# Patient Record
Sex: Female | Born: 1960 | Race: White | Hispanic: No | Marital: Single | Smoking: Never smoker
Health system: Southern US, Community
[De-identification: ages and names within clinical notes are randomized; demographics above are authoritative.]

## PROBLEM LIST (undated history)

## (undated) DIAGNOSIS — F79 Unspecified intellectual disabilities: Secondary | ICD-10-CM

## (undated) DIAGNOSIS — F419 Anxiety disorder, unspecified: Secondary | ICD-10-CM

## (undated) DIAGNOSIS — F200 Paranoid schizophrenia: Secondary | ICD-10-CM

## (undated) DIAGNOSIS — E785 Hyperlipidemia, unspecified: Secondary | ICD-10-CM

## (undated) DIAGNOSIS — E669 Obesity, unspecified: Secondary | ICD-10-CM

## (undated) DIAGNOSIS — K859 Acute pancreatitis without necrosis or infection, unspecified: Secondary | ICD-10-CM

## (undated) DIAGNOSIS — K219 Gastro-esophageal reflux disease without esophagitis: Secondary | ICD-10-CM

## (undated) DIAGNOSIS — I10 Essential (primary) hypertension: Secondary | ICD-10-CM

## (undated) DIAGNOSIS — K831 Obstruction of bile duct: Secondary | ICD-10-CM

## (undated) DIAGNOSIS — S0591XA Unspecified injury of right eye and orbit, initial encounter: Secondary | ICD-10-CM

## (undated) DIAGNOSIS — C549 Malignant neoplasm of corpus uteri, unspecified: Secondary | ICD-10-CM

## (undated) HISTORY — DX: Unspecified injury of right eye and orbit, initial encounter: S05.91XA

## (undated) HISTORY — PX: ABDOMINAL HYSTERECTOMY: SHX81

## (undated) HISTORY — DX: Unspecified intellectual disabilities: F79

## (undated) HISTORY — DX: Essential (primary) hypertension: I10

## (undated) HISTORY — DX: Malignant neoplasm of corpus uteri, unspecified: C54.9

## (undated) HISTORY — DX: Hyperlipidemia, unspecified: E78.5

## (undated) HISTORY — DX: Acute pancreatitis without necrosis or infection, unspecified: K85.90

## (undated) HISTORY — PX: EYE SURGERY: SHX253

## (undated) HISTORY — DX: Obesity, unspecified: E66.9

## (undated) HISTORY — PX: TONSILLECTOMY: SUR1361

---

## 2012-05-16 ENCOUNTER — Ambulatory Visit: Payer: Self-pay | Admitting: Internal Medicine

## 2012-05-31 DIAGNOSIS — C549 Malignant neoplasm of corpus uteri, unspecified: Secondary | ICD-10-CM | POA: Insufficient documentation

## 2012-05-31 HISTORY — DX: Malignant neoplasm of corpus uteri, unspecified: C54.9

## 2012-06-18 ENCOUNTER — Ambulatory Visit: Payer: Self-pay | Admitting: Internal Medicine

## 2012-07-25 DIAGNOSIS — E785 Hyperlipidemia, unspecified: Secondary | ICD-10-CM | POA: Insufficient documentation

## 2012-07-25 DIAGNOSIS — F79 Unspecified intellectual disabilities: Secondary | ICD-10-CM

## 2012-07-25 HISTORY — DX: Hyperlipidemia, unspecified: E78.5

## 2012-07-25 HISTORY — PX: ABDOMINAL HYSTERECTOMY: SHX81

## 2012-07-25 HISTORY — DX: Unspecified intellectual disabilities: F79

## 2013-08-26 ENCOUNTER — Ambulatory Visit: Payer: Self-pay | Admitting: Internal Medicine

## 2013-11-24 ENCOUNTER — Ambulatory Visit: Payer: Self-pay | Admitting: Internal Medicine

## 2014-01-13 ENCOUNTER — Emergency Department: Payer: Self-pay | Admitting: Emergency Medicine

## 2015-01-29 ENCOUNTER — Ambulatory Visit: Payer: Self-pay | Admitting: Internal Medicine

## 2015-04-29 ENCOUNTER — Encounter: Payer: Self-pay | Admitting: Psychiatry

## 2015-04-29 ENCOUNTER — Ambulatory Visit (INDEPENDENT_AMBULATORY_CARE_PROVIDER_SITE_OTHER): Payer: Medicare HMO | Admitting: Psychiatry

## 2015-04-29 VITALS — BP 122/78 | HR 90 | Temp 98.8°F

## 2015-04-29 DIAGNOSIS — F200 Paranoid schizophrenia: Secondary | ICD-10-CM | POA: Diagnosis not present

## 2015-04-29 MED ORDER — BENZTROPINE MESYLATE 2 MG PO TABS: 2.0000 mg | ORAL_TABLET | Freq: Every day | ORAL | Status: DC

## 2015-04-29 NOTE — Progress Notes (Signed)
BH MD/PA/NP OP Progress Note  04/29/2015 1:56 PM Kylie Saunders  MRN:  [id number]  Subjective:    Pt is a 54 yo female with h/o Schizophrenia presented for follow up. She stated that she enjoys music and listens to radio most of the the time. She has lost some weight. She has been complaint with her medications. She sleeps well. Staff reported that she has been getting her prescription medications to the pharmacy and pharmacy but her mother has called last month and was requesting medications for the mail order pharmacy. Staff was unaware of the same issue. They reported that they do not want to change her prescription pharmacy at this time.   Chief Complaint:  Chief Complaint    Follow-up; Schizophrenia     Visit Diagnosis:     ICD-9-CM ICD-10-CM   1. Paranoid schizophrenia, chronic condition 295.32 F20.0     Past Medical History:  Past Medical History  Diagnosis Date  . Hypertension   . Obesity     Past Surgical History  Procedure Laterality Date  . Abdominal hysterectomy     Family History:  Family History  Problem Relation Age of Onset  . Family history unknown: Yes   Social History:  History   Social History  . Marital Status: Single    Spouse Name: N/A  . Number of Children: N/A  . Years of Education: N/A   Social History Main Topics  . Smoking status: Never Smoker   . Smokeless tobacco: Never Used  . Alcohol Use: No  . Drug Use: No  . Sexual Activity: Not Currently   Other Topics Concern  . None   Social History Narrative  . None   Additional History:  [facility]  Assessment:   Musculoskeletal: Strength & Muscle Tone: within normal limits Gait & Station: normal Patient leans: N/A  Psychiatric Specialty Exam: HPI  Review of Systems  Constitutional: Negative for malaise/fatigue.  HENT: Negative for congestion and tinnitus.   Eyes: Negative for pain.  Respiratory: Negative for hemoptysis.   Cardiovascular: Negative for  claudication.  Gastrointestinal: Negative for diarrhea.  Genitourinary: Negative for frequency.  Musculoskeletal: Negative for back pain.  Neurological: Negative for speech change.  Endo/Heme/Allergies: Negative for environmental allergies.  Psychiatric/Behavioral: Negative for depression and hallucinations. The patient does not have insomnia.     Blood pressure 122/78, pulse 90, temperature 98.8 F (37.1 C), temperature source Tympanic, SpO2 95 %.There is no height or weight on file to calculate BMI.  General Appearance: Casual  Eye Contact:  Fair  Speech:  Slow  Volume:  Decreased  Mood:  Anxious  Affect:  Congruent  Thought Process:  Disorganized  Orientation:  Full (Time, Place, and Person)  Thought Content:  WDL  Suicidal Thoughts:  No  Homicidal Thoughts:  No  Memory:  Immediate;   Fair  Judgement:  Fair  Insight:  Fair and Shallow  Psychomotor Activity:  Decreased  Concentration:  Fair  Recall:  [employer] of Knowledge: Poor  Language: Fair  Akathisia:  No  Handed:  Right  AIMS (if indicated):  none  Assets:  Communication Skills Social Support  ADL's:  Intact  Cognition: WNL  Sleep:  7   Is the patient at risk to self?  No. Has the patient been a risk to self in the past 6 months?  No. Has the patient been a risk to self within the distant past?  No. Is the patient a risk to others?  No. Has the patient been a risk to others in the past 6 months?  No. Has the patient been a risk to others within the distant past?  No.  Current Medications: Current Outpatient Prescriptions  Medication Sig Dispense Refill  . benztropine (COGENTIN) 2 MG tablet Take 2 mg by mouth.    Marland Kitchen ibuprofen (V-R IBUPROFEN JR) 100 MG tablet Take 600 mg by mouth.    Marland Kitchen LORazepam (ATIVAN) 0.5 MG tablet Take 0.5 mg by mouth.    . simvastatin (ZOCOR) 40 MG tablet Take 40 mg by mouth.    . thiothixene (NAVANE) 10 MG capsule Take 10 mg by mouth.     No current facility-administered medications  for this visit.    Medical Decision Making:  Established Problem, Stable/Improving (1)  Treatment Plan Summary:Medication management   Patient currently has enough supply of the medication. Discussed with the group home staff about her medications and she will not be given any prescriptions as they will figure out if her mother has picked up the mail order prescriptions Group home staff will call about the refill of the medications in the next 1-2 weeks Patient will continue on thiothixene,  benztropine and lorazepam   More than 50% of the time spent in psychoeducation, counseling and coordination of care.    This note was generated in part or whole with voice recognition software. Voice regonition is usually quite accurate but there are transcription errors that can and very often do occur. I apologize for any typographical errors that were not detected and corrected.    Rainey Pines 04/29/2015, 1:56 PM

## 2015-07-20 ENCOUNTER — Telehealth: Payer: Self-pay

## 2015-07-20 NOTE — Telephone Encounter (Signed)
[facility] called states that the medication navane cost too much for patient so they wanted to find out if you can change medication to seroquel.

## 2015-07-22 NOTE — Telephone Encounter (Signed)
Need to see pt before medications can be switched. She has never taken Seroquel.

## 2015-07-23 NOTE — Telephone Encounter (Signed)
spoke with Kylie Saunders about the medication and that dr. Gretel Acre wanted to see patient before any changes to medications , he states he was busy right now and he would call us back next week.

## 2015-07-23 NOTE — Telephone Encounter (Signed)
daughter states that we would need to call tonie miles [phone].

## 2015-10-06 ENCOUNTER — Other Ambulatory Visit: Payer: Self-pay

## 2015-10-06 NOTE — Telephone Encounter (Signed)
pt needs refill on lorazepam  .5 mg pt last seen on 04-29-15 next appt 10-28-15. pt wants a 90 day supply  (see copy of rx you wrote on 04-06-15)

## 2015-10-07 MED ORDER — LORAZEPAM 0.5 MG PO TABS: 0.5000 mg | ORAL_TABLET | Freq: Three times a day (TID) | ORAL | Status: DC

## 2015-10-07 NOTE — Telephone Encounter (Signed)
rx faxed -confirmed for ativan .5mg  id # [id number]  order # [id number]

## 2015-10-07 NOTE — Telephone Encounter (Signed)
tried to call back voice mail box full could not leave a message.  pt has to be seen first per Dr. Gretel Acre.  pt has not been seen since 04-29-15.

## 2015-10-07 NOTE — Telephone Encounter (Signed)
the home health nurse called states that dr. Gretel Acre sees her every 6 months.  she states that the appt for december was made in june.  that if she wanted to see patient sooner then 6 months why was they told 6 months.

## 2015-10-28 ENCOUNTER — Ambulatory Visit (INDEPENDENT_AMBULATORY_CARE_PROVIDER_SITE_OTHER): Payer: Medicare HMO | Admitting: Psychiatry

## 2015-10-28 ENCOUNTER — Encounter: Payer: Self-pay | Admitting: Psychiatry

## 2015-10-28 VITALS — BP 132/86 | HR 90 | Temp 98.0°F | Wt 175.6 lb

## 2015-10-28 DIAGNOSIS — F200 Paranoid schizophrenia: Secondary | ICD-10-CM | POA: Diagnosis not present

## 2015-10-28 MED ORDER — LORAZEPAM 0.5 MG PO TABS: 0.5000 mg | ORAL_TABLET | Freq: Three times a day (TID) | ORAL | Status: DC

## 2015-10-28 MED ORDER — THIOTHIXENE 10 MG PO CAPS: 10.0000 mg | ORAL_CAPSULE | Freq: Every day | ORAL | Status: DC

## 2015-10-28 MED ORDER — BENZTROPINE MESYLATE 2 MG PO TABS: 2.0000 mg | ORAL_TABLET | Freq: Every day | ORAL | Status: DC

## 2015-10-28 MED ORDER — THIOTHIXENE 10 MG PO CAPS: 10.0000 mg | ORAL_CAPSULE | Freq: Two times a day (BID) | ORAL | Status: DC

## 2015-10-28 NOTE — Progress Notes (Signed)
BH MD/PA/NP OP Progress Note  10/28/2015 1:57 PM Kylie Saunders  MRN:  [id number]  Subjective:    Pt is a 54 yo female with h/o Schizophrenia presented for follow up by the staff at the group home. Patient stated that she enjoyed the things during holidays with the staff and she has been stable on her medication. Staff reported that she ran out of her lorazepam yesterday morning as she was only dispensed 58 pills of lorazepam by the pharmacy her pharmacy. I called the pharmacy care pharmacy and they reported that she is on the cycle fill and she was supposed to get and the refill yesterday but they did not bring her the medication. Staff was upset that the pharmacy is not giving her enough medication and she is running out of the lorazepam before her appointments. We discussed with the pharmacy at length about her controlled substance prescriptions. And they reported that they will be giving her medications in a timely fashion so she will not be running out of her lorazepam before her next appointment. She is currently stable on her medications and they will not be adjusted at this time. She has a co-pay of Navane but her mother is helping her financially at this time. She appeared calm and stable on her medications.  Chief Complaint:  Chief Complaint    Follow-up; Medication Refill     Visit Diagnosis:     ICD-9-CM ICD-10-CM   1. Paranoid schizophrenia, chronic condition ([place]) 295.32 F20.0     Past Medical History:  Past Medical History  Diagnosis Date  . Hypertension   . Obesity     Past Surgical History  Procedure Laterality Date  . Abdominal hysterectomy     Family History:  Family History  Problem Relation Age of Onset  . Family history unknown: Yes   Social History:  Social History   Social History  . Marital Status: Single    Spouse Name: N/A  . Number of Children: N/A  . Years of Education: N/A   Social History Main Topics  . Smoking status: Never Smoker   .  Smokeless tobacco: Never Used  . Alcohol Use: No  . Drug Use: No  . Sexual Activity: Not Currently   Other Topics Concern  . None   Social History Narrative   Additional History:  [facility] than [facility]  group [facility]  Assessment:   Musculoskeletal: Strength & Muscle Tone: within normal limits Gait & Station: normal Patient leans: N/A  Psychiatric Specialty Exam: HPI   Review of Systems  Constitutional: Negative for malaise/fatigue.  HENT: Negative for congestion and tinnitus.   Eyes: Negative for pain.  Respiratory: Negative for hemoptysis.   Cardiovascular: Negative for claudication.  Gastrointestinal: Negative for diarrhea.  Genitourinary: Negative for frequency.  Musculoskeletal: Negative for back pain.  Neurological: Negative for speech change.  Endo/Heme/Allergies: Negative for environmental allergies.  Psychiatric/Behavioral: Negative for depression and hallucinations. The patient does not have insomnia.     Blood pressure 132/86, pulse 90, temperature 98 F (36.7 C), temperature source Tympanic, weight 175 lb 9.6 oz (79.652 kg), SpO2 92 %.There is no height on file to calculate BMI.  General Appearance: Casual  Eye Contact:  Fair  Speech:  Slow  Volume:  Decreased  Mood:  Anxious  Affect:  Congruent  Thought Process:  Disorganized  Orientation:  Full (Time, Place, and Person)  Thought Content:  WDL  Suicidal Thoughts:  No  Homicidal Thoughts:  No  Memory:  Immediate;  Fair  Judgement:  Fair  Insight:  Fair and Shallow  Psychomotor Activity:  Decreased  Concentration:  Fair  Recall:  [employer] of Knowledge: Poor  Language: Fair  Akathisia:  No  Handed:  Right  AIMS (if indicated):  none  Assets:  Communication Skills Social Support  ADL's:  Intact  Cognition: WNL  Sleep:  7   Is the patient at risk to self?  No. Has the patient been a risk to self in the past 6 months?  No. Has the patient been a risk to self within the distant past?  No. Is the  patient a risk to others?  No. Has the patient been a risk to others in the past 6 months?  No. Has the patient been a risk to others within the distant past?  No.  Current Medications: Current Outpatient Prescriptions  Medication Sig Dispense Refill  . benztropine (COGENTIN) 2 MG tablet Take 1 tablet (2 mg total) by mouth daily. 30 tablet 6  . ibuprofen (V-R IBUPROFEN JR) 100 MG tablet Take 600 mg by mouth.    Marland Kitchen LORazepam (ATIVAN) 0.5 MG tablet Take 1 tablet (0.5 mg total) by mouth 3 (three) times daily. 90 tablet 5  . simvastatin (ZOCOR) 40 MG tablet Take 40 mg by mouth.    . thiothixene (NAVANE) 10 MG capsule Take 1 capsule (10 mg total) by mouth 2 (two) times daily. 60 capsule 6   No current facility-administered medications for this visit.    Medical Decision Making:  Established Problem, Stable/Improving (1)  Treatment Plan Summary:Medication management   Discussed with the staff and the patient about her medications. Also called the pharmacy her pharmacy and advised them to continue giving her enough supply of the medications so she will not run out of the lorazepam before her next appointment and he agreed with the plan  Patient will continue on thiothixene,  benztropine and lorazepam   More than 50% of the time spent in psychoeducation, counseling and coordination of care.    This note was generated in part or whole with voice recognition software. Voice regonition is usually quite accurate but there are transcription errors that can and very often do occur. I apologize for any typographical errors that were not detected and corrected.    Rainey Pines 10/28/2015, 1:57 PM

## 2016-04-27 ENCOUNTER — Encounter: Payer: Self-pay | Admitting: Psychiatry

## 2016-04-27 ENCOUNTER — Ambulatory Visit (INDEPENDENT_AMBULATORY_CARE_PROVIDER_SITE_OTHER): Payer: Medicare HMO | Admitting: Psychiatry

## 2016-04-27 VITALS — BP 120/90 | HR 89 | Temp 98.0°F | Ht 59.0 in | Wt 182.2 lb

## 2016-04-27 DIAGNOSIS — F200 Paranoid schizophrenia: Secondary | ICD-10-CM

## 2016-04-27 DIAGNOSIS — F79 Unspecified intellectual disabilities: Secondary | ICD-10-CM | POA: Diagnosis not present

## 2016-04-27 MED ORDER — LORAZEPAM 0.5 MG PO TABS
0.5000 mg | ORAL_TABLET | Freq: Three times a day (TID) | ORAL | Status: DC
Start: 2016-04-27 — End: 2016-11-08

## 2016-04-27 MED ORDER — THIOTHIXENE 10 MG PO CAPS
10.0000 mg | ORAL_CAPSULE | Freq: Two times a day (BID) | ORAL | Status: DC
Start: 2016-04-27 — End: 2016-11-08

## 2016-04-27 MED ORDER — BENZTROPINE MESYLATE 2 MG PO TABS: 2.0000 mg | ORAL_TABLET | Freq: Every day | ORAL | Status: DC

## 2016-04-27 NOTE — Progress Notes (Signed)
BH MD/PA/NP OP Progress Note  04/27/2016 3:26 PM Kylie Saunders  MRN:  [id number]  Subjective:    Pt is a 55 yo female with h/o Schizophrenia presented for follow up accompanied  by the staff at the group home. Patient reported that she has been doing well. She talks to her mother every Sunday at 7:30 PM. She reported that her brothers caught on her [birth year] in [birth year]. She stated that she enjoys time at the group home. She appeared calm and alert during the interview. She denied having any side effects of the medication. She has been stable on her medications and has been taking them as prescribed. She denied having any perceptual disturbances. She denied having any suicidal homicidal ideations or plans. Her mother helps with her medical bills at this time    Chief Complaint:   Visit Diagnosis:     ICD-9-CM ICD-10-CM   1. Paranoid schizophrenia, chronic condition ([place]) 295.32 F20.0   2. Intellectual disability [address] [place]     Past Medical History:  Past Medical History  Diagnosis Date  . Hypertension   . Obesity     Past Surgical History  Procedure Laterality Date  . Abdominal hysterectomy     Family History:  Family History  Problem Relation Age of Onset  . Family history unknown: Yes   Social History:  Social History   Social History  . Marital Status: Single    Spouse Name: N/A  . Number of Children: N/A  . Years of Education: N/A   Social History Main Topics  . Smoking status: Never Smoker   . Smokeless tobacco: Never Used  . Alcohol Use: No  . Drug Use: No  . Sexual Activity: Not Currently   Other Topics Concern  . Not on file   Social History Narrative   Additional History:  Lives at  [facility]  group home  Assessment:   Musculoskeletal: Strength & Muscle Tone: within normal limits Gait & Station: normal Patient leans: N/A  Psychiatric Specialty Exam: HPI  Review of Systems  Constitutional: Negative for malaise/fatigue.  HENT: Negative for congestion and  tinnitus.   Eyes: Negative for pain.  Respiratory: Negative for hemoptysis.   Cardiovascular: Negative for claudication.  Gastrointestinal: Negative for diarrhea.  Genitourinary: Negative for frequency.  Musculoskeletal: Negative for back pain.  Neurological: Negative for speech change.  Endo/Heme/Allergies: Negative for environmental allergies.  Psychiatric/Behavioral: Negative for depression and hallucinations. The patient does not have insomnia.   All other systems reviewed and are negative.   There were no vitals taken for this visit.There is no height or weight on file to calculate BMI.  General Appearance: Casual  Eye Contact:  Fair  Speech:  Slow  Volume:  Decreased  Mood:  Anxious  Affect:  Congruent  Thought Process:  Coherent and Goal Directed  Orientation:  Full (Time, Place, and Person)  Thought Content:  WDL  Suicidal Thoughts:  No  Homicidal Thoughts:  No  Memory:  Immediate;   Fair  Judgement:  Fair  Insight:  Fair and Shallow  Psychomotor Activity:  Decreased  Concentration:  Fair  Recall:  [employer] of Knowledge: Poor  Language: Fair  Akathisia:  No  Handed:  Right  AIMS (if indicated):  none  Assets:  Communication Skills Social Support  ADL's:  Intact  Cognition: WNL  Sleep:  7   Is the patient at risk to self?  No. Has the patient been a risk to self in the past 6 months?  No. Has the patient been a risk to self within the distant past?  No. Is the patient a risk to others?  No. Has the patient been a risk to others in the past 6 months?  No. Has the patient been a risk to others within the distant past?  No.  Current Medications: Current Outpatient Prescriptions  Medication Sig Dispense Refill  . benztropine (COGENTIN) 2 MG tablet Take 1 tablet (2 mg total) by mouth daily. 30 tablet 6  . ibuprofen (V-R IBUPROFEN JR) 100 MG tablet Take 600 mg by mouth.    Marland Kitchen LORazepam (ATIVAN) 0.5 MG tablet Take 1 tablet (0.5 mg total) by mouth 3 (three) times  daily. 90 tablet 5  . simvastatin (ZOCOR) 40 MG tablet Take 40 mg by mouth.    . thiothixene (NAVANE) 10 MG capsule Take 1 capsule (10 mg total) by mouth 2 (two) times daily. 60 capsule 6   No current facility-administered medications for this visit.    Medical Decision Making:  Established Problem, Stable/Improving (1)  Treatment Plan Summary:Medication management   Discussed with the staff and the patient about her medications. Refilled her medications for the next 6 months. She was given a prescription of lorazepam with 5 refills  Follow-up in 6 months   More than 50% of the time spent in psychoeducation, counseling and coordination of care.    This note was generated in part or whole with voice recognition software. Voice regonition is usually quite accurate but there are transcription errors that can and very often do occur. I apologize for any typographical errors that were not detected and corrected.    Rainey Pines, MD  04/27/2016, 3:26 PM

## 2016-06-05 ENCOUNTER — Other Ambulatory Visit: Payer: Self-pay | Admitting: Psychiatry

## 2016-06-06 LAB — COMPREHENSIVE METABOLIC PANEL
ALT: 17 IU/L (ref 0–32)
AST: 11 IU/L (ref 0–40)
Albumin/Globulin Ratio: 1.4 (ref 1.2–2.2)
Albumin: 4 g/dL (ref 3.5–5.5)
Alkaline Phosphatase: 73 IU/L (ref 39–117)
BUN/Creatinine Ratio: 9 (ref 9–23)
BUN: 8 mg/dL (ref 6–24)
Bilirubin Total: <0.2 mg/dL (ref 0.0–1.2)
CO2: 26 mmol/L (ref 18–29)
Calcium: 8.8 mg/dL (ref 8.7–10.2)
Chloride: 97 mmol/L (ref 96–106)
Creatinine, Ser: 0.91 mg/dL (ref 0.57–1.00)
GFR calc Af Amer: 81 mL/min/{1.73_m2} (ref >59)
GFR calc non Af Amer: 70 mL/min/{1.73_m2} (ref >59)
Globulin, Total: 2.9 g/dL (ref 1.5–4.5)
Glucose: 94 mg/dL (ref 65–99)
Potassium: 4.4 mmol/L (ref 3.5–5.2)
Sodium: 141 mmol/L (ref 134–144)
Total Protein: 6.9 g/dL (ref 6.0–8.5)

## 2016-06-06 LAB — CBC WITH DIFFERENTIAL/PLATELET
Basophils Absolute: 0 10*3/uL (ref 0.0–0.2)
Basos: 0 %
EOS (ABSOLUTE): 0.3 10*3/uL (ref 0.0–0.4)
Eos: 3 %
Hematocrit: 38.3 % (ref 34.0–46.6)
Hemoglobin: 12.7 g/dL (ref 11.1–15.9)
Immature Grans (Abs): 0 10*3/uL (ref 0.0–0.1)
Immature Granulocytes: 0 %
Lymphocytes Absolute: 1.6 10*3/uL (ref 0.7–3.1)
Lymphs: 18 %
MCH: 29.7 pg (ref 26.6–33.0)
MCHC: 33.2 g/dL (ref 31.5–35.7)
MCV: 90 fL (ref 79–97)
Monocytes Absolute: 0.8 10*3/uL (ref 0.1–0.9)
Monocytes: 9 %
Neutrophils Absolute: 6.4 10*3/uL (ref 1.4–7.0)
Neutrophils: 70 %
Platelets: 249 10*3/uL (ref 150–379)
RBC: 4.27 x10E6/uL (ref 3.77–5.28)
RDW: 14.7 % (ref 12.3–15.4)
WBC: 9.1 10*3/uL (ref 3.4–10.8)

## 2016-06-06 LAB — LIPID PANEL WITH LDL/HDL RATIO
Cholesterol, Total: 165 mg/dL (ref 100–199)
HDL: 39 mg/dL — ABNORMAL LOW (ref >39)
LDL Calculated: 88 mg/dL (ref 0–99)
LDl/HDL Ratio: 2.3 ratio units (ref 0.0–3.2)
Triglycerides: 192 mg/dL — ABNORMAL HIGH (ref 0–149)
VLDL Cholesterol Cal: 38 mg/dL (ref 5–40)

## 2016-06-06 LAB — ABN OPTION 3

## 2016-06-06 LAB — TSH: TSH: 2.61 u[IU]/mL (ref 0.450–4.500)

## 2016-07-19 ENCOUNTER — Encounter: Payer: Self-pay | Admitting: Psychiatry

## 2016-10-26 ENCOUNTER — Ambulatory Visit: Payer: Medicare HMO | Admitting: Psychiatry

## 2016-11-03 ENCOUNTER — Encounter: Payer: Self-pay | Admitting: Emergency Medicine

## 2016-11-03 ENCOUNTER — Emergency Department
Admission: EM | Admit: 2016-11-03 | Discharge: 2016-11-03 | Disposition: A | Discharge status: Other Acute Inpatient Hospital | Payer: Medicare HMO | Attending: Ophthalmology | Admitting: Ophthalmology

## 2016-11-03 ENCOUNTER — Emergency Department: Payer: Medicare HMO

## 2016-11-03 DIAGNOSIS — W19XXXA Unspecified fall, initial encounter: Secondary | ICD-10-CM | POA: Diagnosis not present

## 2016-11-03 DIAGNOSIS — S0083XA Contusion of other part of head, initial encounter: Secondary | ICD-10-CM

## 2016-11-03 DIAGNOSIS — Y999 Unspecified external cause status: Secondary | ICD-10-CM | POA: Insufficient documentation

## 2016-11-03 DIAGNOSIS — Y939 Activity, unspecified: Secondary | ICD-10-CM | POA: Diagnosis not present

## 2016-11-03 DIAGNOSIS — S0990XA Unspecified injury of head, initial encounter: Secondary | ICD-10-CM | POA: Diagnosis present

## 2016-11-03 DIAGNOSIS — S0501XA Injury of conjunctiva and corneal abrasion without foreign body, right eye, initial encounter: Secondary | ICD-10-CM

## 2016-11-03 DIAGNOSIS — S0531XA Ocular laceration without prolapse or loss of intraocular tissue, right eye, initial encounter: Secondary | ICD-10-CM

## 2016-11-03 DIAGNOSIS — Z79899 Other long term (current) drug therapy: Secondary | ICD-10-CM | POA: Diagnosis not present

## 2016-11-03 DIAGNOSIS — H2101 Hyphema, right eye: Secondary | ICD-10-CM | POA: Insufficient documentation

## 2016-11-03 DIAGNOSIS — Y929 Unspecified place or not applicable: Secondary | ICD-10-CM | POA: Insufficient documentation

## 2016-11-03 DIAGNOSIS — S0591XA Unspecified injury of right eye and orbit, initial encounter: Secondary | ICD-10-CM

## 2016-11-03 DIAGNOSIS — I10 Essential (primary) hypertension: Secondary | ICD-10-CM | POA: Insufficient documentation

## 2016-11-03 DIAGNOSIS — H05231 Hemorrhage of right orbit: Secondary | ICD-10-CM | POA: Diagnosis not present

## 2016-11-03 DIAGNOSIS — F200 Paranoid schizophrenia: Secondary | ICD-10-CM | POA: Insufficient documentation

## 2016-11-03 DIAGNOSIS — S0521XA Ocular laceration and rupture with prolapse or loss of intraocular tissue, right eye, initial encounter: Secondary | ICD-10-CM | POA: Diagnosis not present

## 2016-11-03 HISTORY — DX: Unspecified injury of right eye and orbit, initial encounter: S05.91XA

## 2016-11-03 LAB — COMPREHENSIVE METABOLIC PANEL
ALT: 21 U/L (ref 14–54)
AST: 24 U/L (ref 15–41)
Albumin: 4.1 g/dL (ref 3.5–5.0)
Alkaline Phosphatase: 60 U/L (ref 38–126)
Anion gap: 5 (ref 5–15)
BILIRUBIN TOTAL: 0.4 mg/dL (ref 0.3–1.2)
BUN: 11 mg/dL (ref 6–20)
CO2: 32 mmol/L (ref 22–32)
Calcium: 8.4 mg/dL — ABNORMAL LOW (ref 8.9–10.3)
Chloride: 99 mmol/L — ABNORMAL LOW (ref 101–111)
Creatinine, Ser: 0.88 mg/dL (ref 0.44–1.00)
GFR calc Af Amer: >60 mL/min (ref >60)
Glucose, Bld: 124 mg/dL — ABNORMAL HIGH (ref 65–99)
POTASSIUM: 3.4 mmol/L — AB (ref 3.5–5.1)
Sodium: 136 mmol/L (ref 135–145)
TOTAL PROTEIN: 8 g/dL (ref 6.5–8.1)

## 2016-11-03 LAB — CBC WITH DIFFERENTIAL/PLATELET
BASOS ABS: 0.1 10*3/uL (ref 0–0.1)
Basophils Relative: 1 %
Eosinophils Absolute: 0.2 10*3/uL (ref 0–0.7)
Eosinophils Relative: 1 %
HEMATOCRIT: 40.9 % (ref 35.0–47.0)
Hemoglobin: 13.5 g/dL (ref 12.0–16.0)
LYMPHS ABS: 1.4 10*3/uL (ref 1.0–3.6)
LYMPHS PCT: 13 %
MCH: 29.5 pg (ref 26.0–34.0)
MCHC: 33.1 g/dL (ref 32.0–36.0)
MCV: 89.3 fL (ref 80.0–100.0)
MONO ABS: 0.8 10*3/uL (ref 0.2–0.9)
MONOS PCT: 8 %
Neutro Abs: 8.4 10*3/uL — ABNORMAL HIGH (ref 1.4–6.5)
Neutrophils Relative %: 77 %
Platelets: 261 10*3/uL (ref 150–440)
RBC: 4.58 MIL/uL (ref 3.80–5.20)
RDW: 14.3 % (ref 11.5–14.5)
WBC: 10.9 10*3/uL (ref 3.6–11.0)

## 2016-11-03 MED ORDER — LABETALOL HCL 5 MG/ML IV SOLN
10.0000 mg | Freq: Once | INTRAVENOUS | Status: AC
  Administered 2016-11-03: 10 mg via INTRAVENOUS
  Filled 2016-11-03 (×2): qty 4

## 2016-11-03 MED ORDER — ONDANSETRON HCL 4 MG/2ML IJ SOLN
4.0000 mg | Freq: Once | INTRAMUSCULAR | Status: AC
  Administered 2016-11-03: 4 mg via INTRAVENOUS

## 2016-11-03 MED ORDER — ONDANSETRON HCL 4 MG/2ML IJ SOLN
INTRAMUSCULAR | Status: AC
  Administered 2016-11-03: 4 mg via INTRAVENOUS
  Filled 2016-11-03: qty 2

## 2016-11-03 MED ORDER — TETRACAINE HCL 0.5 % OP SOLN
2.0000 [drp] | Freq: Once | OPHTHALMIC | Status: AC
  Administered 2016-11-03: 2 [drp] via OPHTHALMIC

## 2016-11-03 MED ORDER — HYDROMORPHONE HCL 1 MG/ML IJ SOLN
1.0000 mg | Freq: Once | INTRAMUSCULAR | Status: AC
  Administered 2016-11-03: 1 mg via INTRAVENOUS
  Filled 2016-11-03: qty 1

## 2016-11-03 MED ORDER — TETRACAINE HCL 0.5 % OP SOLN
OPHTHALMIC | Status: AC
  Filled 2016-11-03: qty 2

## 2016-11-03 MED ORDER — FLUORESCEIN SODIUM 0.6 MG OP STRP
1.0000 | ORAL_STRIP | Freq: Once | OPHTHALMIC | Status: AC
  Administered 2016-11-03: 1 via OPHTHALMIC

## 2016-11-03 MED ORDER — LABETALOL HCL 5 MG/ML IV SOLN
10.0000 mg | Freq: Once | INTRAVENOUS | Status: AC
  Administered 2016-11-03: 10 mg via INTRAVENOUS
  Filled 2016-11-03: qty 4

## 2016-11-03 MED ORDER — FLUORESCEIN SODIUM 0.6 MG OP STRP
ORAL_STRIP | OPHTHALMIC | Status: AC
  Filled 2016-11-03: qty 1
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
## 2016-11-03 NOTE — ED Notes (Signed)
Patients brother Saralyn Pilar [facility]) and Earlie Server [facility] staff) called to inform about patient transferring to [facility] at this time. No answer from either person

## 2016-11-03 NOTE — Consult Note (Signed)
Subjective: 55 yo female fell striking right eye this am. Sudden complete loss of vision. Also pain.  Objective: Vital signs in last 24 hours: Temp:  [97.7 F (36.5 C)] 97.7 F (36.5 C) (12/15 0930) Pulse Rate:  [89] 89 (12/15 0930) Resp:  [16] 16 (12/15 0930) BP: (175)/(115) 175/115 (12/15 0930) SpO2:  [96 %] 96 % (12/15 0930)    OD NLP  IOP 11 mHg, OS 20mmHg.   OS normal OD mydriatic. OD severe hemorrhagic chemosis. Cornea intact with some distortion of shape. Exceedingly deep anterior chamber with 40% hyphema.  No view to posterior segment.   Recent Labs  11/03/16 1046  WBC 10.9  HGB 13.5  HCT 40.9  NA 136  K 3.4*  CL 99*  CO2 32  BUN 11  CREATININE 0.88    Studies/Results: Ct Head Wo Contrast  Result Date: 11/03/2016 CLINICAL DATA:  Recent fall on to right face with loss of right vision EXAM: CT HEAD WITHOUT CONTRAST TECHNIQUE: Contiguous axial images were obtained from the base of the skull through the vertex without intravenous contrast. COMPARISON:  None. FINDINGS: Brain: No evidence of acute infarction, hemorrhage, hydrocephalus, extra-axial collection or mass lesion/mass effect. Vascular: No hyperdense vessel or unexpected calcification. Skull: Normal. Negative for fracture or focal lesion. Sinuses/Orbits: The left globe is within normal limits. There is considerable soft tissue swelling over the right orbit consistent with the recent injury. Additionally there is hyperdense material along the posterior margin of the globe diffusely likely representing hemorrhage. This would correspond with the patient's given clinical history of vision loss. No other orbital abnormality on the right is noted. Other: None. IMPRESSION: Changes in the right periorbital region consistent with the recent fall. Additionally there is apparent hemorrhage within the posterior aspect of the globe on the right. No acute intracranial abnormality noted. Electronically Signed   By: Inez Catalina  M.D.   On: 11/03/2016 10:22    Medications: reviewed  Assessment/Plan: I reviewed CT of brain/orbits. Globe is slightly distorted and with considerable blood in the posterior segment. Also a disruption of the sclera seems visible, however this could be confounded by motion artifact.  1. 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 This may require exploratory surgery, repair possible eventual enucleation.   I would refer this patient to a tertiary care center for further eval. treatment  LOS: 0 days   Halea Lieb [place] 12/15/201712:12 PM
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
## 2016-11-03 NOTE — ED Notes (Signed)
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

## 2016-11-08 ENCOUNTER — Encounter: Payer: Self-pay | Admitting: Psychiatry

## 2016-11-08 ENCOUNTER — Ambulatory Visit (INDEPENDENT_AMBULATORY_CARE_PROVIDER_SITE_OTHER): Payer: Medicare HMO | Admitting: Psychiatry

## 2016-11-08 ENCOUNTER — Telehealth: Payer: Self-pay

## 2016-11-08 VITALS — BP 150/100 | HR 99 | Temp 98.9°F | Wt 185.2 lb

## 2016-11-08 DIAGNOSIS — F200 Paranoid schizophrenia: Secondary | ICD-10-CM | POA: Diagnosis not present

## 2016-11-08 DIAGNOSIS — F79 Unspecified intellectual disabilities: Secondary | ICD-10-CM

## 2016-11-08 MED ORDER — LORAZEPAM 0.5 MG PO TABS: 0.5000 mg | ORAL_TABLET | Freq: Three times a day (TID) | ORAL | 5 refills | Status: DC

## 2016-11-08 MED ORDER — BENZTROPINE MESYLATE 2 MG PO TABS: 2.0000 mg | ORAL_TABLET | Freq: Every day | ORAL | 6 refills | Status: DC

## 2016-11-08 MED ORDER — THIOTHIXENE 10 MG PO CAPS: 10.0000 mg | ORAL_CAPSULE | Freq: Two times a day (BID) | ORAL | 6 refills | Status: DC

## 2016-11-08 NOTE — Telephone Encounter (Signed)
faxed and confirmed rx for ativan .5mg  id # [id number] order # [id number]

## 2016-11-08 NOTE — Progress Notes (Signed)
BH MD/PA/NP OP Progress Note  11/08/2016 2:35 PM Kylie Saunders  MRN:  [id number]  Subjective:    Pt is a 55 yo female with h/o Schizophrenia presented for follow up accompanied  by the staff at the group home. Patient fell 5 days ago while coming out of the plan as she was going to the group program and injured her right eye. She went to [facility] and her eye was operated upon. She has appointment tomorrow for the follow-up. Staff reported that it was an accident. Patient reported that she is unable to see out of her right eye at this time. She appeared calm and alert during the interview. She reported that she is ready for the holidays. She has been compliant with her medications and no acute issues noted at this time. Staff is helping her and she is sleeping well at night. He for the holidays at the group home. She will spend time with her family members as well. She has been stable on her medications and has been taking them as prescribed. She denied having any perceptual disturbances. She denied having any suicidal homicidal ideations or plans. Her mother helps with her medical bills at this time    Chief Complaint:  Chief Complaint    Follow-up; Medication Refill     Visit Diagnosis:     ICD-9-CM ICD-10-CM   1. Paranoid schizophrenia, chronic condition ([place]) 295.32 F20.0   2. Intellectual disability [address]     Past Medical History:  Past Medical History:  Diagnosis Date  . Hypertension   . Obesity     Past Surgical History:  Procedure Laterality Date  . ABDOMINAL HYSTERECTOMY    . EYE SURGERY     Family History:  Family History  Problem Relation Age of Onset  . Family history unknown: Yes   Social History:  Social History   Social History  . Marital status: Single    Spouse name: N/A  . Number of children: N/A  . Years of education: N/A   Social History Main Topics  . Smoking status: Never Smoker  . Smokeless tobacco: Never Used  . Alcohol use No  .  Drug use: No  . Sexual activity: Not Currently   Other Topics Concern  . None   Social History Narrative  . None   Additional History:  Lives at  [facility]  group home  Assessment:   Musculoskeletal: Strength & Muscle Tone: within normal limits Gait & Station: normal Patient leans: N/A  Psychiatric Specialty Exam: Medication Refill  Pertinent negatives include no congestion.    Review of Systems  Constitutional: Negative for malaise/fatigue.  HENT: Negative for congestion and tinnitus.   Eyes: Negative for pain.  Respiratory: Negative for hemoptysis.   Cardiovascular: Negative for claudication.  Gastrointestinal: Negative for diarrhea.  Genitourinary: Negative for frequency.  Musculoskeletal: Negative for back pain.  Neurological: Negative for speech change.  Endo/Heme/Allergies: Negative for environmental allergies.  Psychiatric/Behavioral: Negative for depression and hallucinations. The patient does not have insomnia.   All other systems reviewed and are negative.   Blood pressure (!) 150/100, pulse 99, temperature 98.9 F (37.2 C), temperature source Oral, weight 185 lb 3.2 oz (84 kg).Body mass index is 37.41 kg/m.  General Appearance: Casual  Eye Contact:  Fair  Speech:  Slow  Volume:  Decreased  Mood:  Anxious  Affect:  Congruent  Thought Process:  Coherent and Goal Directed  Orientation:  Full (Time, Place, and Person)  Thought Content:  WDL  Suicidal Thoughts:  No  Homicidal Thoughts:  No  Memory:  Immediate;   Fair  Judgement:  Fair  Insight:  Fair and Shallow  Psychomotor Activity:  Decreased  Concentration:  Fair  Recall:  [employer] of Knowledge: Poor  Language: Fair  Akathisia:  No  Handed:  Right  AIMS (if indicated):  none  Assets:  Communication Skills Social Support  ADL's:  Intact  Cognition: WNL  Sleep:  7   Is the patient at risk to self?  No. Has the patient been a risk to self in the past 6 months?  No. Has the patient been a  risk to self within the distant past?  No. Is the patient a risk to others?  No. Has the patient been a risk to others in the past 6 months?  No. Has the patient been a risk to others within the distant past?  No.  Current Medications: Current Outpatient Prescriptions  Medication Sig Dispense Refill  . benztropine (COGENTIN) 2 MG tablet Take 1 tablet (2 mg total) by mouth daily. 30 tablet 6  . ibuprofen (V-R IBUPROFEN JR) 100 MG tablet Take 600 mg by mouth.    Marland Kitchen LORazepam (ATIVAN) 0.5 MG tablet Take 1 tablet (0.5 mg total) by mouth 3 (three) times daily. 90 tablet 5  . simvastatin (ZOCOR) 40 MG tablet Take 40 mg by mouth.    . thiothixene (NAVANE) 10 MG capsule Take 1 capsule (10 mg total) by mouth 2 (two) times daily. 60 capsule 6   No current facility-administered medications for this visit.     Medical Decision Making:  Established Problem, Stable/Improving (1)  Treatment Plan Summary:Medication management   Discussed with the staff and the patient about her medications. Refilled her medications for the next 6 months. She was given a prescription of lorazepam with 5 refills  Follow-up in 6 months   More than 50% of the time spent in psychoeducation, counseling and coordination of care.    This note was generated in part or whole with voice recognition software. Voice regonition is usually quite accurate but there are transcription errors that can and very often do occur. I apologize for any typographical errors that were not detected and corrected.    Rainey Pines, MD  11/08/2016, 2:35 PM

## 2016-12-16 ENCOUNTER — Inpatient Hospital Stay
Admission: EM | Admit: 2016-12-16 | Discharge: 2016-12-19 | DRG: 439 | Disposition: A | Discharge status: Home / Self Care | Payer: Medicare HMO | Attending: Internal Medicine | Admitting: Internal Medicine

## 2016-12-16 ENCOUNTER — Emergency Department: Payer: Medicare HMO

## 2016-12-16 ENCOUNTER — Encounter: Payer: Self-pay | Admitting: Emergency Medicine

## 2016-12-16 DIAGNOSIS — Z79899 Other long term (current) drug therapy: Secondary | ICD-10-CM | POA: Diagnosis not present

## 2016-12-16 DIAGNOSIS — F200 Paranoid schizophrenia: Secondary | ICD-10-CM | POA: Diagnosis present

## 2016-12-16 DIAGNOSIS — E785 Hyperlipidemia, unspecified: Secondary | ICD-10-CM | POA: Diagnosis present

## 2016-12-16 DIAGNOSIS — Z823 Family history of stroke: Secondary | ICD-10-CM

## 2016-12-16 DIAGNOSIS — K859 Acute pancreatitis without necrosis or infection, unspecified: Secondary | ICD-10-CM | POA: Diagnosis not present

## 2016-12-16 DIAGNOSIS — K851 Biliary acute pancreatitis without necrosis or infection: Principal | ICD-10-CM | POA: Diagnosis present

## 2016-12-16 DIAGNOSIS — E876 Hypokalemia: Secondary | ICD-10-CM | POA: Diagnosis present

## 2016-12-16 DIAGNOSIS — R0602 Shortness of breath: Secondary | ICD-10-CM

## 2016-12-16 DIAGNOSIS — N179 Acute kidney failure, unspecified: Secondary | ICD-10-CM | POA: Diagnosis present

## 2016-12-16 DIAGNOSIS — H409 Unspecified glaucoma: Secondary | ICD-10-CM | POA: Diagnosis present

## 2016-12-16 DIAGNOSIS — E669 Obesity, unspecified: Secondary | ICD-10-CM | POA: Diagnosis present

## 2016-12-16 DIAGNOSIS — Z6834 Body mass index (BMI) 34.0-34.9, adult: Secondary | ICD-10-CM

## 2016-12-16 DIAGNOSIS — R109 Unspecified abdominal pain: Secondary | ICD-10-CM | POA: Diagnosis present

## 2016-12-16 DIAGNOSIS — Z8542 Personal history of malignant neoplasm of other parts of uterus: Secondary | ICD-10-CM | POA: Diagnosis not present

## 2016-12-16 DIAGNOSIS — K8010 Calculus of gallbladder with chronic cholecystitis without obstruction: Secondary | ICD-10-CM | POA: Diagnosis present

## 2016-12-16 DIAGNOSIS — Z90710 Acquired absence of both cervix and uterus: Secondary | ICD-10-CM

## 2016-12-16 DIAGNOSIS — I10 Essential (primary) hypertension: Secondary | ICD-10-CM | POA: Diagnosis present

## 2016-12-16 DIAGNOSIS — E860 Dehydration: Secondary | ICD-10-CM | POA: Diagnosis present

## 2016-12-16 DIAGNOSIS — R1012 Left upper quadrant pain: Secondary | ICD-10-CM | POA: Diagnosis not present

## 2016-12-16 HISTORY — DX: Paranoid schizophrenia: F20.0

## 2016-12-16 LAB — CBC
HCT: 35.3 % (ref 35.0–47.0)
HEMOGLOBIN: 11.6 g/dL — AB (ref 12.0–16.0)
MCH: 29.5 pg (ref 26.0–34.0)
MCHC: 32.7 g/dL (ref 32.0–36.0)
MCV: 90.2 fL (ref 80.0–100.0)
Platelets: 238 10*3/uL (ref 150–440)
RBC: 3.91 MIL/uL (ref 3.80–5.20)
RDW: 14.9 % — AB (ref 11.5–14.5)
WBC: 13.3 10*3/uL — AB (ref 3.6–11.0)

## 2016-12-16 LAB — COMPREHENSIVE METABOLIC PANEL
ALT: 69 U/L — ABNORMAL HIGH (ref 14–54)
ANION GAP: 7 (ref 5–15)
AST: 29 U/L (ref 15–41)
Albumin: 3.1 g/dL — ABNORMAL LOW (ref 3.5–5.0)
Alkaline Phosphatase: 132 U/L — ABNORMAL HIGH (ref 38–126)
BILIRUBIN TOTAL: 0.6 mg/dL (ref 0.3–1.2)
BUN: 14 mg/dL (ref 6–20)
CHLORIDE: 101 mmol/L (ref 101–111)
CO2: 29 mmol/L (ref 22–32)
Calcium: 8.5 mg/dL — ABNORMAL LOW (ref 8.9–10.3)
Creatinine, Ser: 1.15 mg/dL — ABNORMAL HIGH (ref 0.44–1.00)
GFR calc Af Amer: >60 mL/min (ref >60)
GFR, EST NON AFRICAN AMERICAN: 52 mL/min — AB (ref >60)
Glucose, Bld: 167 mg/dL — ABNORMAL HIGH (ref 65–99)
POTASSIUM: 3.2 mmol/L — AB (ref 3.5–5.1)
Sodium: 137 mmol/L (ref 135–145)
TOTAL PROTEIN: 7.1 g/dL (ref 6.5–8.1)

## 2016-12-16 LAB — URINALYSIS, COMPLETE (UACMP) WITH MICROSCOPIC
BILIRUBIN URINE: NEGATIVE
Glucose, UA: NEGATIVE mg/dL
HGB URINE DIPSTICK: NEGATIVE
KETONES UR: NEGATIVE mg/dL
NITRITE: NEGATIVE
PROTEIN: 100 mg/dL — AB
SPECIFIC GRAVITY, URINE: 1.035 — AB (ref 1.005–1.030)
pH: 5 (ref 5.0–8.0)

## 2016-12-16 LAB — MAGNESIUM: Magnesium: 2.3 mg/dL (ref 1.7–2.4)

## 2016-12-16 LAB — LIPASE, BLOOD: LIPASE: 91 U/L — AB (ref 11–51)

## 2016-12-16 MED ORDER — ARTIFICIAL TEARS OP OINT
1.0000 | TOPICAL_OINTMENT | Freq: Two times a day (BID) | OPHTHALMIC | Status: DC
Start: 2016-12-16 — End: 2016-12-19
  Administered 2016-12-16 – 2016-12-19 (×6): 1 via OPHTHALMIC
  Filled 2016-12-16: qty 3.5

## 2016-12-16 MED ORDER — LORAZEPAM 0.5 MG PO TABS
0.5000 mg | ORAL_TABLET | Freq: Three times a day (TID) | ORAL | Status: DC
  Administered 2016-12-16 – 2016-12-19 (×9): 0.5 mg via ORAL
  Filled 2016-12-16 (×9): qty 1

## 2016-12-16 MED ORDER — IOPAMIDOL (ISOVUE-300) INJECTION 61%
100.0000 mL | Freq: Once | INTRAVENOUS | Status: AC | PRN
  Administered 2016-12-16: 100 mL via INTRAVENOUS

## 2016-12-16 MED ORDER — ONDANSETRON HCL 4 MG PO TABS: 4.0000 mg | ORAL_TABLET | Freq: Four times a day (QID) | ORAL | Status: DC | PRN

## 2016-12-16 MED ORDER — SIMVASTATIN 40 MG PO TABS
40.0000 mg | ORAL_TABLET | Freq: Every day | ORAL | Status: DC
  Administered 2016-12-16 – 2016-12-18 (×3): 40 mg via ORAL
  Filled 2016-12-16 (×3): qty 1

## 2016-12-16 MED ORDER — PREDNISOLONE ACETATE 1 % OP SUSP
1.0000 [drp] | Freq: Four times a day (QID) | OPHTHALMIC | Status: DC
  Administered 2016-12-16 – 2016-12-19 (×12): 1 [drp] via OPHTHALMIC
  Filled 2016-12-16: qty 1

## 2016-12-16 MED ORDER — CIPROFLOXACIN IN D5W 400 MG/200ML IV SOLN
400.0000 mg | Freq: Once | INTRAVENOUS | Status: AC
Start: 2016-12-16 — End: 2016-12-16
  Administered 2016-12-16: 400 mg via INTRAVENOUS
  Filled 2016-12-16: qty 200

## 2016-12-16 MED ORDER — ACETAMINOPHEN 325 MG PO TABS
650.0000 mg | ORAL_TABLET | Freq: Four times a day (QID) | ORAL | Status: DC | PRN
  Administered 2016-12-16 – 2016-12-18 (×3): 650 mg via ORAL
  Filled 2016-12-16 (×3): qty 2

## 2016-12-16 MED ORDER — ONDANSETRON HCL 4 MG/2ML IJ SOLN
4.0000 mg | Freq: Once | INTRAMUSCULAR | Status: AC
  Administered 2016-12-16: 4 mg via INTRAVENOUS
  Filled 2016-12-16: qty 2

## 2016-12-16 MED ORDER — POTASSIUM CHLORIDE CRYS ER 20 MEQ PO TBCR
20.0000 meq | EXTENDED_RELEASE_TABLET | Freq: Two times a day (BID) | ORAL | Status: AC
  Administered 2016-12-16 – 2016-12-18 (×6): 20 meq via ORAL
  Filled 2016-12-16 (×7): qty 1

## 2016-12-16 MED ORDER — IBUPROFEN 400 MG PO TABS
400.0000 mg | ORAL_TABLET | Freq: Once | ORAL | Status: AC
  Administered 2016-12-16: 400 mg via ORAL
  Filled 2016-12-16: qty 1

## 2016-12-16 MED ORDER — MORPHINE SULFATE (PF) 4 MG/ML IV SOLN
4.0000 mg | Freq: Once | INTRAVENOUS | Status: AC
  Administered 2016-12-16: 4 mg via INTRAVENOUS
  Filled 2016-12-16: qty 1

## 2016-12-16 MED ORDER — BENZTROPINE MESYLATE 1 MG PO TABS
2.0000 mg | ORAL_TABLET | Freq: Every evening | ORAL | Status: DC
  Administered 2016-12-16 – 2016-12-18 (×3): 2 mg via ORAL
  Filled 2016-12-16 (×3): qty 2

## 2016-12-16 MED ORDER — POTASSIUM CHLORIDE IN NACL 20-0.9 MEQ/L-% IV SOLN
INTRAVENOUS | Status: DC
  Administered 2016-12-16 – 2016-12-18 (×4): via INTRAVENOUS
  Filled 2016-12-16 (×6): qty 1000

## 2016-12-16 MED ORDER — MOXIFLOXACIN HCL 0.5 % OP SOLN
1.0000 [drp] | Freq: Three times a day (TID) | OPHTHALMIC | Status: DC
  Administered 2016-12-16 – 2016-12-19 (×8): 1 [drp] via OPHTHALMIC
  Filled 2016-12-16: qty 3

## 2016-12-16 MED ORDER — ATROPINE SULFATE 1 % OP SOLN
1.0000 [drp] | Freq: Two times a day (BID) | OPHTHALMIC | Status: DC
  Administered 2016-12-16 – 2016-12-19 (×6): 1 [drp] via OPHTHALMIC
  Filled 2016-12-16: qty 2

## 2016-12-16 MED ORDER — METRONIDAZOLE 500 MG PO TABS
500.0000 mg | ORAL_TABLET | Freq: Three times a day (TID) | ORAL | Status: DC
  Administered 2016-12-16 – 2016-12-19 (×10): 500 mg via ORAL
  Filled 2016-12-16 (×10): qty 1

## 2016-12-16 MED ORDER — ENOXAPARIN SODIUM 40 MG/0.4ML ~~LOC~~ SOLN
40.0000 mg | SUBCUTANEOUS | Status: DC
  Administered 2016-12-18: 40 mg via SUBCUTANEOUS
  Filled 2016-12-16 (×2): qty 0.4

## 2016-12-16 MED ORDER — ONDANSETRON HCL 4 MG/2ML IJ SOLN
4.0000 mg | Freq: Four times a day (QID) | INTRAMUSCULAR | Status: DC | PRN
  Administered 2016-12-17 (×2): 4 mg via INTRAVENOUS
  Filled 2016-12-16 (×2): qty 2

## 2016-12-16 MED ORDER — IOPAMIDOL (ISOVUE-300) INJECTION 61%
30.0000 mL | Freq: Once | INTRAVENOUS | Status: AC | PRN
  Administered 2016-12-16: 30 mL via ORAL

## 2016-12-16 MED ORDER — MORPHINE SULFATE (PF) 2 MG/ML IV SOLN
2.0000 mg | INTRAVENOUS | Status: DC | PRN
  Administered 2016-12-16 – 2016-12-18 (×3): 2 mg via INTRAVENOUS
  Filled 2016-12-16 (×3): qty 1

## 2016-12-16 MED ORDER — THIOTHIXENE 10 MG PO CAPS
10.0000 mg | ORAL_CAPSULE | Freq: Two times a day (BID) | ORAL | Status: DC
  Administered 2016-12-16 – 2016-12-19 (×6): 10 mg via ORAL
  Filled 2016-12-16 (×7): qty 1

## 2016-12-16 MED ORDER — LATANOPROST 0.005 % OP SOLN
2.0000 [drp] | Freq: Every day | OPHTHALMIC | Status: DC
  Administered 2016-12-16 – 2016-12-18 (×3): 2 [drp] via OPHTHALMIC
  Filled 2016-12-16: qty 2.5

## 2016-12-16 MED ORDER — ACETAMINOPHEN 650 MG RE SUPP: 650.0000 mg | Freq: Four times a day (QID) | RECTAL | Status: DC | PRN

## 2016-12-16 NOTE — Consult Note (Signed)
Consultation  Referring Provider:     No ref. provider found Primary Care Physician:  No PCP Per Patient Primary Gastroenterologist:  None Reason for Consultation:  Abdominal pain  Date of Admission:  12/16/2016 Date of Consultation:  12/16/2016         HPI:   Kylie Saunders is a 56 y.o. female with a history notable for schizophrenia and obesity who presents for evaluation of one week of LUQ abdominal pain.  The patient is a limited historian. She reports that for the past week, she is experiencing LUQ abdominal pain that does not radiate anywhere. She cannot identify exacerbating or alleviating factors. She has never had pain like this before. She denies any food triggers, nausea, emesis, diarrhea, constipation, gas, bloating, GI bleeding, f/s/c or jaundice.    Past Medical History:  Diagnosis Date  . Hypertension   . Obesity   . Paranoid schizophrenia [facility])     Past Surgical History:  Procedure Laterality Date  . ABDOMINAL HYSTERECTOMY    . EYE SURGERY      Prior to Admission medications   Medication Sig Start Date End Date Taking? Authorizing Provider  Artificial Tear Ointment (ARTIFICIAL TEARS) ointment Place 1 application into the right eye 2 (two) times daily. 11/04/16  Yes Historical Provider, MD  atropine 1 % ophthalmic solution Place 1 drop into the right eye 2 (two) times daily. 11/04/16 11/04/17 Yes Historical Provider, MD  benztropine (COGENTIN) 2 MG tablet Take 1 tablet (2 mg total) by mouth daily. 11/08/16  Yes Rainey Pines, MD  ibuprofen (V-R IBUPROFEN JR) 100 MG tablet Take 600 mg by mouth. 07/26/12  Yes Historical Provider, MD  latanoprost (XALATAN) 0.005 % ophthalmic solution Place 2 drops into the right eye at bedtime. 11/04/16 11/04/17 Yes Historical Provider, MD  LORazepam (ATIVAN) 0.5 MG tablet Take 1 tablet (0.5 mg total) by mouth 3 (three) times daily. 11/08/16  Yes Rainey Pines, MD  moxifloxacin (VIGAMOX) 0.5 % ophthalmic solution Place 1 drop into the right  eye 3 (three) times daily.   Yes Historical Provider, MD  prednisoLONE acetate (PRED FORTE) 1 % ophthalmic suspension Place 1 drop into the right eye 4 (four) times daily.   Yes Historical Provider, MD  simvastatin (ZOCOR) 40 MG tablet Take 40 mg by mouth. 05/31/12  Yes Historical Provider, MD  thiothixene (NAVANE) 10 MG capsule Take 1 capsule (10 mg total) by mouth 2 (two) times daily. 11/08/16  Yes Rainey Pines, MD    Family History  Problem Relation Age of Onset  . Diabetes Neg Hx   . Hypertension Neg Hx      Social History  Substance Use Topics  . Smoking status: Never Smoker  . Smokeless tobacco: Never Used  . Alcohol use No    Allergies as of 12/16/2016  . (No Known Allergies)    Review of Systems:    All systems reviewed and negative except where noted in HPI.   Physical Exam:  Vital signs in last 24 hours: Temp:  [99 F (37.2 C)] 99 F (37.2 C) (01/27 0947) Pulse Rate:  [94-109] 95 (01/27 1500) Resp:  [14-20] 15 (01/27 1500) BP: (126-170)/(76-96) 134/92 (01/27 1500) SpO2:  [80 %-99 %] 99 % (01/27 1500) Weight:  [86.2 kg (190 lb)] 86.2 kg (190 lb) (01/27 0949)   General: Middle aged obese white female laying comfortably in an ER stretcher Head:  Normocephalic Eyes:  R eye swollen nearly closed, sclera with erythema, sclerae anicteric, no conjunctival pallor Ears:  Normal auditory acuity. Mouth: dry mucous membranes Neck: no head and neck lymphadenopathy Lungs: breathing comfortably with oxygen by nasal cannula Heart: Regular rate and rhythm, normal S1, S2, no murmurs appreciated Abdomen: normal bowel sounds, soft, obese, mildly distended, TTP in LUQ without rebound or guarding, organomegaly is difficult to appreciate given body habitus Rectal:  Not indicated Msk:  Symmetrical without gross deformities. Extremities:  Without edema, cyanosis or clubbing. Neurologic:  Alert, responding to questions in one word answers Skin: feels warm, no notable rashes or jaundice  appreciated Psych:  Alert and cooperative  LAB RESULTS:  Recent Labs  12/16/16 1006  WBC 13.3*  HGB 11.6*  HCT 35.3  PLT 238   BMET  Recent Labs  12/16/16 1006  NA 137  K 3.2*  CL 101  CO2 29  GLUCOSE 167*  BUN 14  CREATININE 1.15*  CALCIUM 8.5*   LFT  Recent Labs  12/16/16 1006  PROT 7.1  ALBUMIN 3.1*  AST 29  ALT 69*  ALKPHOS 132*  BILITOT 0.6   PT/INR No results for input(s): LABPROT, INR in the last 72 hours.  STUDIES: Ct Abdomen Pelvis W Contrast  Result Date: 12/16/2016 CLINICAL DATA:  Left side abdominal pain EXAM: CT ABDOMEN AND PELVIS WITH CONTRAST TECHNIQUE: Multidetector CT imaging of the abdomen and pelvis was performed using the standard protocol following bolus administration of intravenous contrast. CONTRAST:  163mL ISOVUE-300 IOPAMIDOL (ISOVUE-300) INJECTION 61% COMPARISON:  None. FINDINGS: Lower chest: Linear atelectasis or scarring at the left base. Heart is normal size. No effusions. Hepatobiliary: Gallstones fill the gallbladder. No focal hepatic abnormality. Pancreas: Stranding around the pancreatic body and tail compatible with acute pancreatitis. No ductal dilatation or focal abnormality. Spleen: No focal abnormality.  Normal size. Adrenals/Urinary Tract: No adrenal abnormality. No focal renal abnormality. No stones or hydronephrosis. Urinary bladder is unremarkable. Stomach/Bowel: Appendix is normal. Stomach, large and small bowel grossly unremarkable. Vascular/Lymphatic: No evidence of aneurysm or adenopathy. Scattered aortic and iliac calcifications. Reproductive: Prior hysterectomy.  No adnexal masses. Other: No free fluid or free air. Musculoskeletal: No acute bony abnormality. IMPRESSION: Stranding around the pancreatic body and tail compatible with acute pancreatitis. Cholelithiasis. Electronically Signed   By: Rolm Baptise M.D.   On: 12/16/2016 11:50   US Abdomen Limited Ruq  Result Date: 12/16/2016 CLINICAL DATA:  Pancreatitis,  cholelithiasis EXAM: US ABDOMEN LIMITED - RIGHT UPPER QUADRANT COMPARISON:  12/16/2016 FINDINGS: Gallbladder: The gallbladder is collapsed but contains numerous small echogenic shadowing gallstones. Gallbladder wall is thickened measuring 5.4 mm, a degree of this may be secondary to collapse. No Murphy's sign elicited. No pericholecystic fluid. Common bile duct: Diameter: 10.6 mm.  Difficult to exclude distal choledocholithiasis. Liver: No focal hepatic abnormality or intrahepatic biliary dilatation. Patent hepatic and portal veins. No fluid collection. IMPRESSION: Collapsed gallbladder containing numerous small calcified gallstones. Mild wall thickening measuring 5.4 mm. No elicited Murphy sign or pericholecystic fluid. 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 Dilated common bile duct measuring 10.6 mm. Difficult to exclude distal choledocholithiasis. No hepatic abnormality. Electronically Signed   By: Jerilynn Mages.  Shick M.D.   On: 12/16/2016 13:03      Impression / Plan:   Kylie Saunders is a 56 y.o. y/o female with a history notable for schizophrenia and obesity who presents for evaluation of one week of LUQ abdominal pain. Since history from the patient is limited, it is important to consider her radiologic abnormalities. She is being treated with a typical antipsychotic, while the class is known  to cause pancreatitis, her antipsychotic has been described to cause pancreatitis specifically; that could also be because it is newer. In any case, she does not appear to have clinical pancreatitis, although her pancreas is radiologically inflamed. It is more likely that she has pain from a biliary etiology. While her CBD is dilated, narrowing or upstream dilation was not appreciated by the radiologist on her imaging, which lowers concern for choledocholithiasis. Furthermore, her bilirubin is not elevated. Her presentation is most consistent with cholecystitis. She should be treated with antibiotics.  Therefore, she merits evaluation by general surgery. Should she undergo a cholecystectomy, she should be considered for an intraoperative cholangiogram to clear her bile ducts. At this time, the risks of an ERCP outweight the potential benefits without a discrete CBD stone being identified on imaging.   - admit for monitoring  - consult general surgery for cholecystitis - antibiotics with biliary penetration - IVF for hydration as patient appears dehydrated on exam - trend wbc and lfts - check INR  Thank you for involving me in the care of this patient.      LOS: 0 days   Lisbeth Renshaw, MD  12/16/2016, 3:28 PM

## 2016-12-16 NOTE — ED Notes (Signed)
Patient transported to Ultrasound 

## 2016-12-16 NOTE — Consult Note (Signed)
Patient ID: Kylie Saunders, female   DOB: 05-30-1961, 56 y.o.   MRN: [id number]  CC: Abdominal Pain  HPI Kylie Saunders is a 56 y.o. female who presents to the emergency department today for evaluation of left-sided abdominal pain. Patient is a poor historian. She reports that she's been having the pain for approximately a week. She denies fevers, chills, nausea, vomiting, diarrhea. She cannot recall ever having pain like this before. Her last bowel movement was yesterday and was normal for her. Surgery was counseled by the ER due to the diagnosis of cholelithiasis and a mild pancreatitis.  HPI  Past Medical History:  Diagnosis Date  . Hypertension   . Obesity   Paranoid schizophrenia Endometrial cancer Scleral rupture  Past Surgical History:  Procedure Laterality Date  . ABDOMINAL HYSTERECTOMY    . EYE SURGERY    Repair of conjunctival laceration in December 2017.  Family History  Problem Relation Age of Onset  . Diabetes Neg Hx   . Hypertension Neg Hx   Father had a stroke  Social History Social History  Substance Use Topics  . Smoking status: Never Smoker  . Smokeless tobacco: Never Used  . Alcohol use No    No Known Allergies  Current Facility-Administered Medications  Medication Dose Route Frequency Provider Last Rate Last Dose  . ciprofloxacin (CIPRO) IVPB 400 mg  400 mg Intravenous Once Harvest Dark, MD 200 mL/hr at 12/16/16 1509 400 mg at 12/16/16 1509  . metroNIDAZOLE (FLAGYL) tablet 500 mg  500 mg Oral Q8H Harvest Dark, MD   500 mg at 12/16/16 1509   Current Outpatient Prescriptions  Medication Sig Dispense Refill  . benztropine (COGENTIN) 2 MG tablet Take 1 tablet (2 mg total) by mouth daily. 30 tablet 6  . ibuprofen (V-R IBUPROFEN JR) 100 MG tablet Take 600 mg by mouth.    Marland Kitchen LORazepam (ATIVAN) 0.5 MG tablet Take 1 tablet (0.5 mg total) by mouth 3 (three) times daily. 90 tablet 5  . simvastatin (ZOCOR) 40 MG tablet Take 40 mg by mouth.    .  thiothixene (NAVANE) 10 MG capsule Take 1 capsule (10 mg total) by mouth 2 (two) times daily. 60 capsule 6     Review of Systems A Multi-point review of systems was asked and was negative except for the findings documented in the history of present illness  Physical Exam Blood pressure (!) 161/90, pulse 96, temperature 99 F (37.2 C), temperature source Oral, resp. rate 17, height 5\' 2"  (1.575 m), weight 86.2 kg (190 lb), SpO2 (!) 88 %. CONSTITUTIONAL: Resting in bed in no acute distress. EYES: Pupils are equal, round, and reactive to light, right eye with obvious inflammation EARS, NOSE, MOUTH AND THROAT: The oropharynx is clear. The oral mucosa is pink and moist. Hearing is intact to voice. LYMPH NODES:  Lymph nodes in the neck are normal. RESPIRATORY:  Lungs are clear. There is normal respiratory effort, with equal breath sounds bilaterally, and without pathologic use of accessory muscles. CARDIOVASCULAR: Heart is regular without murmurs, gallops, or rubs. GI: The abdomen is large, soft, tender to palpation in the left upper quadrant, and nondistended. There are no palpable masses. There is no hepatosplenomegaly. There are normal bowel sounds in all quadrants. GU: Rectal deferred.   MUSCULOSKELETAL: Normal muscle strength and tone. No cyanosis or edema.   SKIN: Turgor is good and there are no pathologic skin lesions or ulcers. NEUROLOGIC: Motor and sensation is grossly normal. Cranial nerves are grossly intact. PSYCH:  Oriented  to person, place and time.  Data Reviewed Images and labs reviewed. Labs concerning for a mild leukocytosis of 13.3, mild elevation in alkaline phosphatase at 132, mild elevation in lipase 91, mildly elevated ALT of 69, mild hypokalemia of 3.2. Ultrasound of the right upper quadrant shows multiple gallstones but no obvious pericholecystic fluid as well as a dilated common bile duct. CT scan of the abdomen shows some inflammation around the pancreas consistent with  pancreatitis as well as the dilated common bile duct. I have personally reviewed the patient's imaging, laboratory findings and medical records.    Assessment    Gallstone pancreatitis    Plan    56 year old female with likely gallstone pancreatitis. Patient also with histories of mental disability as well as paranoid schizophrenia per the chart. Recommend admission to the medical service for hydration, IV antibiotics, trending of labs and serial exams until pancreatitis fully resolved. After which a laparoscopic cholecystectomy will be discussed with the patient. Possibly ready for surgery in 2 or 3 days. Surgery will follow along, patient may require GI evaluation if there becomes further evidence of possible choledocholithiasis.     Time spent with the patient was 50 minutes, with more than 50% of the time spent in face-to-face education, counseling and care coordination.     Clayburn Pert, MD FACS General Surgeon 12/16/2016, 3:18 PM

## 2016-12-16 NOTE — ED Notes (Signed)
Pt sating between 85-88%, placed on 2Ls [place]. MD made aware. Will continue to monitor.

## 2016-12-16 NOTE — Progress Notes (Signed)
Notified Dr. Jannifer Franklin of patient temp order to give Ibuprofen X1; Will continue to monitor.

## 2016-12-16 NOTE — ED Provider Notes (Signed)
[facility] Emergency Department Provider Note  Time seen: 10:17 AM  I have reviewed the triage vital signs and the nursing notes.   HISTORY  Chief Complaint Abdominal Pain    HPI Kylie Saunders is a 56 y.o. female with a past medical history of hypertension, hyperlipidemia, presents to the emergency department for left-sided abdominal pain. According to the patient for the past one week she has been experiencing pain in her left abdomen. Denies any fever, nausea, vomiting, diarrhea. Denies any dysuria or hematuria. Does not believe she has ever had pain in her abdomen before. Patient states a normal bowel movement yesterday. Currently describes the pain as moderate.  Past Medical History:  Diagnosis Date  . Hypertension   . Obesity     Patient Active Problem List   Diagnosis Date Noted  . HLD (hyperlipidemia) 07/25/2012  . Intellectual disability 07/25/2012  . Cancer of body of uterus ([place]) 05/31/2012  . Malignant neoplasm of body of uterus ([place]) 05/31/2012    Past Surgical History:  Procedure Laterality Date  . ABDOMINAL HYSTERECTOMY    . EYE SURGERY      Prior to Admission medications   Medication Sig Start Date End Date Taking? Authorizing Provider  moxifloxacin (VIGAMOX) 0.5 % ophthalmic solution 1 drop 3 (three) times daily.   Yes Historical Provider, MD  benztropine (COGENTIN) 2 MG tablet Take 1 tablet (2 mg total) by mouth daily. 11/08/16   Rainey Pines, MD  ibuprofen (V-R IBUPROFEN JR) 100 MG tablet Take 600 mg by mouth. 07/26/12   Historical Provider, MD  LORazepam (ATIVAN) 0.5 MG tablet Take 1 tablet (0.5 mg total) by mouth 3 (three) times daily. 11/08/16   Rainey Pines, MD  simvastatin (ZOCOR) 40 MG tablet Take 40 mg by mouth. 05/31/12   Historical Provider, MD  thiothixene (NAVANE) 10 MG capsule Take 1 capsule (10 mg total) by mouth 2 (two) times daily. 11/08/16   Rainey Pines, MD    No Known Allergies  Family History  Problem Relation  Age of Onset  . Family history unknown: Yes    Social History Social History  Substance Use Topics  . Smoking status: Never Smoker  . Smokeless tobacco: Never Used  . Alcohol use No    Review of Systems Constitutional: Negative for fever. Cardiovascular: Negative for chest pain. Respiratory: Negative for shortness of breath. Gastrointestinal: Left-sided abdominal pain. Negative for nausea, vomiting, diarrhea. Genitourinary: Negative for dysuria. Negative for hematuria. Neurological: Negative for headache 10-point ROS otherwise negative.  ____________________________________________   PHYSICAL EXAM:  VITAL SIGNS: ED Triage Vitals  Enc Vitals Group     BP 12/16/16 0947 126/76     Pulse Rate 12/16/16 0947 (!) 109     Resp 12/16/16 0947 20     Temp 12/16/16 0947 99 F (37.2 C)     Temp Source 12/16/16 0947 Oral     SpO2 12/16/16 0947 95 %     Weight 12/16/16 0949 190 lb (86.2 kg)     Height 12/16/16 0949 5\' 2"  (1.575 m)     Head Circumference --      Peak Flow --      Pain Score 12/16/16 0950 10     Pain Loc --      Pain Edu? --      Excl. in [place]? --     Constitutional: Alert and oriented. Well appearing and in no distress. ENT   Head: Normocephalic and atraumatic.   Mouth/Throat: Mucous membranes are moist.  Cardiovascular: Normal rate, regular rhythm. No murmur Respiratory: Normal respiratory effort without tachypnea nor retractions. Breath sounds are clear Gastrointestinal: Mildly distended abdomen with moderate left-sided abdominal tenderness to palpation. Normal bowel sounds. No rebound or guarding. Musculoskeletal: Nontender with normal range of motion in all extremities. Neurologic:  Normal speech and language. No gross focal neurologic deficits  Skin:  Skin is warm, dry and intact.  Psychiatric: Mood and affect are normal.  ____________________________________________    EKG  EKG reviewed and interpreted by myself shows sinus tachycardia 105 bpm.  Narrow QRS, normal axis, Lorcet normal intervals with nonspecific ST changes. No ST elevation.  ____________________________________________    RADIOLOGY  Ultrasound shows CBD dilation to 10.6 mm. CT shows pancreatitis with cholelithiasis  ____________________________________________   INITIAL IMPRESSION / ASSESSMENT AND PLAN / ED COURSE  Pertinent labs & imaging results that were available during my care of the patient were reviewed by me and considered in my medical decision making (see chart for details).  The patient presents to the emergency department with left-sided abdominal pain. On exam patient has moderate abdominal tenderness to palpation. No rebound or guarding. No distention. We will check labs likely proceed with CT of the abdomen/pelvis to further evaluate. Currently the patient appears well, no distress.  CT shows pancreatitis or cholelithiasis, CBD is dilated on ultrasound. Patient has no right upper quadrant tenderness at this time. GI medicine and general surgery have seen the patient in the emergency department. Patient will be admitted to medical service for antibiotics and continued pain management for her pancreatitis  ____________________________________________   FINAL CLINICAL IMPRESSION(S) / ED DIAGNOSES  Left-sided abdominal pain Pancreatitis   Harvest Dark, MD 12/16/16 1525

## 2016-12-16 NOTE — ED Notes (Signed)
Pt sating between 85-88%, placed on 2Ls [place], MD made aware. Will continue to monitor.

## 2016-12-16 NOTE — H&P (Addendum)
[facility] [place] NAME: Kylie Saunders    MR#:  [id number]  DATE OF BIRTH:  1961-10-03  DATE OF ADMISSION:  12/16/2016  PRIMARY CARE PHYSICIAN: No PCP Per Patient   REQUESTING/REFERRING PHYSICIAN: Dr. Harvest Dark  CHIEF COMPLAINT:   Chief Complaint  Patient presents with  . Abdominal Pain    HISTORY OF PRESENT ILLNESS:  Kylie Saunders  is a 56 y.o. female with a known history of Paranoid schizophrenia, hypertension, obesity who presents to the hospital complaining of left upper quadrant abdominal pain ongoing for the past week. Patient denies any aggravating or alleviating factors of abdominal pain and says it's been there the entire week. She denies any associated nausea vomiting diarrhea, melena, hematochezia or any other associated symptoms. Since the pain was not improving the group home sent her to the ER for further evaluation. Emergency room patient underwent a CT scan of the abdomen pelvis which was suggestive of acute pancreatitis. Abdominal ultrasound is also suggestive of cholelithiasis with possible choledocholithiasis. She continues to have persistent symptoms and therefore hospitalist services were contacted further treatment and evaluation.  PAST MEDICAL HISTORY:   Past Medical History:  Diagnosis Date  . Hypertension   . Obesity   . Paranoid schizophrenia ([place])     PAST SURGICAL HISTORY:   Past Surgical History:  Procedure Laterality Date  . ABDOMINAL HYSTERECTOMY    . EYE SURGERY      SOCIAL HISTORY:   Social History  Substance Use Topics  . Smoking status: Never Smoker  . Smokeless tobacco: Never Used  . Alcohol use No    FAMILY HISTORY:   Family History  Problem Relation Age of Onset  . Diabetes Neg Hx   . Hypertension Neg Hx     DRUG ALLERGIES:  No Known Allergies  REVIEW OF SYSTEMS:   Review of Systems  Constitutional: Negative for fever and weight loss.  HENT: Negative for congestion,  nosebleeds and tinnitus.   Eyes: Negative for blurred vision, double vision and redness.  Respiratory: Negative for cough, hemoptysis and shortness of breath.   Cardiovascular: Negative for chest pain, orthopnea, leg swelling and PND.  Gastrointestinal: Positive for abdominal pain. Negative for diarrhea, melena, nausea and vomiting.  Genitourinary: Negative for dysuria, hematuria and urgency.  Musculoskeletal: Negative for falls and joint pain.  Neurological: Negative for dizziness, tingling, sensory change, focal weakness, seizures, weakness and headaches.  Endo/Heme/Allergies: Negative for polydipsia. Does not bruise/bleed easily.  Psychiatric/Behavioral: Negative for depression and memory loss. The patient is not nervous/anxious.     MEDICATIONS AT HOME:   Prior to Admission medications   Medication Sig Start Date End Date Taking? Authorizing Provider  benztropine (COGENTIN) 2 MG tablet Take 1 tablet (2 mg total) by mouth daily. 11/08/16   Rainey Pines, MD  ibuprofen (V-R IBUPROFEN JR) 100 MG tablet Take 600 mg by mouth. 07/26/12   Historical Provider, MD  LORazepam (ATIVAN) 0.5 MG tablet Take 1 tablet (0.5 mg total) by mouth 3 (three) times daily. 11/08/16   Rainey Pines, MD  simvastatin (ZOCOR) 40 MG tablet Take 40 mg by mouth. 05/31/12   Historical Provider, MD  thiothixene (NAVANE) 10 MG capsule Take 1 capsule (10 mg total) by mouth 2 (two) times daily. 11/08/16   Rainey Pines, MD      VITAL SIGNS:  Blood pressure (!) 161/90, pulse 96, temperature 99 F (37.2 C), temperature source Oral, resp. rate 17, height 5\' 2"  (1.575 m), weight 86.2  kg (190 lb), SpO2 (!) 88 %.  PHYSICAL EXAMINATION:  Physical Exam  GENERAL:  56 y.o.-year-old obese patient lying in the bed in no acute distress.  EYES: Pupils equal, round, reactive to light and accommodation. No scleral icterus. Extraocular muscles intact.  HEENT: Head atraumatic, normocephalic. Oropharynx and nasopharynx clear. No oropharyngeal  erythema, moist oral mucosa  NECK:  Supple, no jugular venous distention. No thyroid enlargement, no tenderness.  LUNGS: Normal breath sounds bilaterally, no wheezing, rales, rhonchi. No use of accessory muscles of respiration.  CARDIOVASCULAR: S1, S2 RRR. No murmurs, rubs, gallops, clicks.  ABDOMEN: Soft, Tender in the left upper quadrant but no rebound or rigidity, nondistended. Bowel sounds present. No organomegaly or mass.  EXTREMITIES: No pedal edema, cyanosis, or clubbing. + 2 pedal & radial pulses b/l.   NEUROLOGIC: Cranial nerves II through XII are intact. No focal Motor or sensory deficits appreciated b/l PSYCHIATRIC: The patient is alert and oriented x 3.  SKIN: No obvious rash, lesion, or ulcer.   LABORATORY PANEL:   CBC  Recent Labs Lab 12/16/16 1006  WBC 13.3*  HGB 11.6*  HCT 35.3  PLT 238   ------------------------------------------------------------------------------------------------------------------  Chemistries   Recent Labs Lab 12/16/16 1006  NA 137  K 3.2*  CL 101  CO2 29  GLUCOSE 167*  BUN 14  CREATININE 1.15*  CALCIUM 8.5*  AST 29  ALT 69*  ALKPHOS 132*  BILITOT 0.6   ------------------------------------------------------------------------------------------------------------------  Cardiac Enzymes No results for input(s): TROPONINI in the last 168 hours. ------------------------------------------------------------------------------------------------------------------  RADIOLOGY:  Ct Abdomen Pelvis W Contrast  Result Date: 12/16/2016 CLINICAL DATA:  Left side abdominal pain EXAM: CT ABDOMEN AND PELVIS WITH CONTRAST TECHNIQUE: Multidetector CT imaging of the abdomen and pelvis was performed using the standard protocol following bolus administration of intravenous contrast. CONTRAST:  181mL ISOVUE-300 IOPAMIDOL (ISOVUE-300) INJECTION 61% COMPARISON:  None. FINDINGS: Lower chest: Linear atelectasis or scarring at the left base. Heart is normal  size. No effusions. Hepatobiliary: Gallstones fill the gallbladder. No focal hepatic abnormality. Pancreas: Stranding around the pancreatic body and tail compatible with acute pancreatitis. No ductal dilatation or focal abnormality. Spleen: No focal abnormality.  Normal size. Adrenals/Urinary Tract: No adrenal abnormality. No focal renal abnormality. No stones or hydronephrosis. Urinary bladder is unremarkable. Stomach/Bowel: Appendix is normal. Stomach, large and small bowel grossly unremarkable. Vascular/Lymphatic: No evidence of aneurysm or adenopathy. Scattered aortic and iliac calcifications. Reproductive: Prior hysterectomy.  No adnexal masses. Other: No free fluid or free air. Musculoskeletal: No acute bony abnormality. IMPRESSION: Stranding around the pancreatic body and tail compatible with acute pancreatitis. Cholelithiasis. Electronically Signed   By: Rolm Baptise M.D.   On: 12/16/2016 11:50   US Abdomen Limited Ruq  Result Date: 12/16/2016 CLINICAL DATA:  Pancreatitis, cholelithiasis EXAM: US ABDOMEN LIMITED - RIGHT UPPER QUADRANT COMPARISON:  12/16/2016 FINDINGS: Gallbladder: The gallbladder is collapsed but contains numerous small echogenic shadowing gallstones. Gallbladder wall is thickened measuring 5.4 mm, a degree of this may be secondary to collapse. No Murphy's sign elicited. No pericholecystic fluid. Common bile duct: Diameter: 10.6 mm.  Difficult to exclude distal choledocholithiasis. Liver: No focal hepatic abnormality or intrahepatic biliary dilatation. Patent hepatic and portal veins. No fluid collection. IMPRESSION: Collapsed gallbladder containing numerous small calcified gallstones. Mild wall thickening measuring 5.4 mm. No elicited Murphy sign or pericholecystic fluid. 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 Dilated common bile duct measuring 10.6 mm. Difficult to exclude distal choledocholithiasis. No hepatic abnormality. Electronically Signed   By: Jerilynn Mages.  Shick  M.D.   On: 12/16/2016 13:03     IMPRESSION AND PLAN:   56 year old female with past history of paranoid schizophrenia, obesity, hypertension and presents to the hospital due to left upper quadrant abdominal pain.  1. Left upper quadrant abdominal pain-etiology unclear presently. Patient's CT scan is suggestive of mild acute pancreatitis and abdominal ultrasound is suggestive of cholelithiasis with suspected choledocholithiasis. -Supportive care with IV fluids, antiemetics, pain control. -We will get gastroenterology and also surgical consult. Follow LFTs in a.m. Follow clinically. Place on clear liquid diet for now.  2. Hypokalemia-we'll place on IV and oral potassium supplements and repeat level in the morning. -Check magnesium level.  3. Acute kidney injury-secondary to dehydration. -We'll gently hydrate with IV fluids and follow BUN/creatinine.  4. Leukocytosis - likely stress mediated from LUQ pain.   - will monitor.    5. Glaucoma - cont. Xalantan, Vigamox eye drops.    6. Hyperlipidemia - cont. Zocor.   All the records are reviewed and case discussed with ED provider. Management plans discussed with the patient, family and they are in agreement.  CODE STATUS: Full Code  TOTAL TIME TAKING CARE OF THIS PATIENT: 45 minutes.    Henreitta Leber M.D on 12/16/2016 at 3:22 PM  Between 7am to 6pm - Pager - [phone]  After 6pm go to [email or web address] - password [email or web address] [facility]  [place] [facility]  Office  [phone]  CC: Primary care physician; No PCP Per Patient

## 2016-12-16 NOTE — ED Triage Notes (Signed)
Pt is from [facility] [phone]) c/o left side abdominal pain for the past week.  Denies any urinary sxs.  Denies any diarrhea and has had some vomiting last night. Pt did eat breakfast.  Pt c/o shortness of breath. Denies any flank pain.

## 2016-12-17 DIAGNOSIS — R1012 Left upper quadrant pain: Secondary | ICD-10-CM

## 2016-12-17 DIAGNOSIS — K859 Acute pancreatitis without necrosis or infection, unspecified: Secondary | ICD-10-CM

## 2016-12-17 LAB — COMPREHENSIVE METABOLIC PANEL
ALBUMIN: 2.5 g/dL — AB (ref 3.5–5.0)
ALT: 53 U/L (ref 14–54)
ANION GAP: 8 (ref 5–15)
AST: 24 U/L (ref 15–41)
Alkaline Phosphatase: 116 U/L (ref 38–126)
BUN: 9 mg/dL (ref 6–20)
CHLORIDE: 101 mmol/L (ref 101–111)
CO2: 28 mmol/L (ref 22–32)
Calcium: 7.8 mg/dL — ABNORMAL LOW (ref 8.9–10.3)
Creatinine, Ser: 1.06 mg/dL — ABNORMAL HIGH (ref 0.44–1.00)
GFR calc non Af Amer: 57 mL/min — ABNORMAL LOW (ref >60)
GLUCOSE: 120 mg/dL — AB (ref 65–99)
Potassium: 3.7 mmol/L (ref 3.5–5.1)
SODIUM: 137 mmol/L (ref 135–145)
Total Bilirubin: 0.8 mg/dL (ref 0.3–1.2)
Total Protein: 6.3 g/dL — ABNORMAL LOW (ref 6.5–8.1)

## 2016-12-17 LAB — CBC
HCT: 31.3 % — ABNORMAL LOW (ref 35.0–47.0)
HEMOGLOBIN: 10.6 g/dL — AB (ref 12.0–16.0)
MCH: 30.4 pg (ref 26.0–34.0)
MCHC: 33.9 g/dL (ref 32.0–36.0)
MCV: 89.6 fL (ref 80.0–100.0)
Platelets: 236 10*3/uL (ref 150–440)
RBC: 3.49 MIL/uL — AB (ref 3.80–5.20)
RDW: 15.1 % — ABNORMAL HIGH (ref 11.5–14.5)
WBC: 13.9 10*3/uL — ABNORMAL HIGH (ref 3.6–11.0)

## 2016-12-17 MED ORDER — AMPICILLIN-SULBACTAM SODIUM 3 (2-1) G IJ SOLR
3.0000 g | Freq: Four times a day (QID) | INTRAMUSCULAR | Status: DC
  Administered 2016-12-17 – 2016-12-19 (×9): 3 g via INTRAVENOUS
  Filled 2016-12-17 (×13): qty 3

## 2016-12-17 NOTE — Progress Notes (Addendum)
Pharmacy Antibiotic Note  Kylie Saunders is a 56 y.o. female admitted on 12/16/2016 with Intra-abdominal Infection. Pharmacy has been consulted for Uasyn dosing. Patient was receiving ciprofloxacin and metronidazole. Ciprofloxacin has been discontinued.   Plan: Will start patient on Unasyn 3g IV every 6 hours.   Height: 5\' 2"  (157.5 cm) Weight: 190 lb (86.2 kg) IBW/kg (Calculated) : 50.1  Temp (24hrs), Avg:100.6 F (38.1 C), Min:98.5 F (36.9 C), Max:103.2 F (39.6 C)   Recent Labs Lab 12/16/16 1006 12/17/16 0459  WBC 13.3* 13.9*  CREATININE 1.15* 1.06*    Estimated Creatinine Clearance: 59.6 mL/min (by C-G formula based on SCr of 1.06 mg/dL (H)).    No Known Allergies  Antimicrobials this admission: 1/27 ciprofloxacin >> 1/28 1/27 metronidazole >>  1/28 Unasyn >>  Dose adjustments this admission:  Microbiology results:  Thank you for allowing pharmacy to be a part of this patient's care.  Pernell Dupre, PharmD, BCPS Clinical Pharmacist 12/17/2016 9:42 AM

## 2016-12-17 NOTE — Progress Notes (Signed)
CC: Abdominal pain Subjective: Patient reports that her left-sided abdominal pain has not resolved. She is however tolerating a diet without nausea or vomiting. Patient did have a fever overnight.  Objective: Vital signs in last 24 hours: Temp:  [98.5 F (36.9 C)-103.2 F (39.6 C)] 99.3 F (37.4 C) (01/28 0819) Pulse Rate:  [90-107] 92 (01/28 0819) Resp:  [14-24] 18 (01/28 0819) BP: (98-170)/(55-96) 98/66 (01/28 0819) SpO2:  [80 %-99 %] 98 % (01/28 0819) Last BM Date: 12/15/16  Intake/Output from previous day: 01/27 0701 - 01/28 0700 In: 2640.8 [P.O.:870; I.V.:1570.8; IV Piggyback:200] Out: 450 [Urine:450] Intake/Output this shift: Total I/O In: 720 [P.O.:720] Out: -   Physical exam:  Gen.: No acute distress Chest: Clear to auscultation Heart: Regular rate and rhythm Abdomen: Large, soft, tender to palpation in the left upper quadrant.  Lab Results: CBC   Recent Labs  12/16/16 1006 12/17/16 0459  WBC 13.3* 13.9*  HGB 11.6* 10.6*  HCT 35.3 31.3*  PLT 238 236   BMET  Recent Labs  12/16/16 1006 12/17/16 0459  NA 137 137  K 3.2* 3.7  CL 101 101  CO2 29 28  GLUCOSE 167* 120*  BUN 14 9  CREATININE 1.15* 1.06*  CALCIUM 8.5* 7.8*   PT/INR No results for input(s): LABPROT, INR in the last 72 hours. ABG No results for input(s): PHART, HCO3 in the last 72 hours.  Invalid input(s): PCO2, PO2  Studies/Results: Ct Abdomen Pelvis W Contrast  Result Date: 12/16/2016 CLINICAL DATA:  Left side abdominal pain EXAM: CT ABDOMEN AND PELVIS WITH CONTRAST TECHNIQUE: Multidetector CT imaging of the abdomen and pelvis was performed using the standard protocol following bolus administration of intravenous contrast. CONTRAST:  16mL ISOVUE-300 IOPAMIDOL (ISOVUE-300) INJECTION 61% COMPARISON:  None. FINDINGS: Lower chest: Linear atelectasis or scarring at the left base. Heart is normal size. No effusions. Hepatobiliary: Gallstones fill the gallbladder. No focal hepatic  abnormality. Pancreas: Stranding around the pancreatic body and tail compatible with acute pancreatitis. No ductal dilatation or focal abnormality. Spleen: No focal abnormality.  Normal size. Adrenals/Urinary Tract: No adrenal abnormality. No focal renal abnormality. No stones or hydronephrosis. Urinary bladder is unremarkable. Stomach/Bowel: Appendix is normal. Stomach, large and small bowel grossly unremarkable. Vascular/Lymphatic: No evidence of aneurysm or adenopathy. Scattered aortic and iliac calcifications. Reproductive: Prior hysterectomy.  No adnexal masses. Other: No free fluid or free air. Musculoskeletal: No acute bony abnormality. IMPRESSION: Stranding around the pancreatic body and tail compatible with acute pancreatitis. Cholelithiasis. Electronically Signed   By: Rolm Baptise M.D.   On: 12/16/2016 11:50   US Abdomen Limited Ruq  Result Date: 12/16/2016 CLINICAL DATA:  Pancreatitis, cholelithiasis EXAM: US ABDOMEN LIMITED - RIGHT UPPER QUADRANT COMPARISON:  12/16/2016 FINDINGS: Gallbladder: The gallbladder is collapsed but contains numerous small echogenic shadowing gallstones. Gallbladder wall is thickened measuring 5.4 mm, a degree of this may be secondary to collapse. No Murphy's sign elicited. No pericholecystic fluid. Common bile duct: Diameter: 10.6 mm.  Difficult to exclude distal choledocholithiasis. Liver: No focal hepatic abnormality or intrahepatic biliary dilatation. Patent hepatic and portal veins. No fluid collection. IMPRESSION: Collapsed gallbladder containing numerous small calcified gallstones. Mild wall thickening measuring 5.4 mm. No elicited Murphy sign or pericholecystic fluid. 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 Dilated common bile duct measuring 10.6 mm. Difficult to exclude distal choledocholithiasis. No hepatic abnormality. Electronically Signed   By: Jerilynn Mages.  Shick M.D.   On: 12/16/2016 13:03    Anti-infectives: Anti-infectives    Start  Dose/Rate Route Frequency Ordered Stop   12/17/16 1000  Ampicillin-Sulbactam (UNASYN) 3 g in sodium chloride 0.9 % 100 mL IVPB     3 g 200 mL/hr over 30 Minutes Intravenous Every 6 hours 12/17/16 0940     12/16/16 1430  ciprofloxacin (CIPRO) IVPB 400 mg     400 mg 200 mL/hr over 60 Minutes Intravenous  Once 12/16/16 1418 12/16/16 1609   12/16/16 1430  metroNIDAZOLE (FLAGYL) tablet 500 mg     500 mg Oral Every 8 hours 12/16/16 1418        Assessment/Plan:  56 year old female admitted with the diagnosis of gallstone pancreatitis. Pain has not resolved. Discussed with the patient that we would continue to treat with antibiotics and plan for surgery once the pain from her pancreatitis has resolved. She appeared to voiced understanding but difficult to ascertain giving her mental status. Surgery will continue to follow.  Annleigh Knueppel T. Adonis Huguenin, MD, FACS  12/17/2016

## 2016-12-17 NOTE — Progress Notes (Signed)
[place] at [facility]                                                                                                                                                                                  Patient Demographics   Kylie Saunders, is a 56 y.o. female, DOB - [birth year], [id number]  Admit date - 12/16/2016   Admitting Physician Henreitta Leber, MD  Outpatient Primary MD for the patient is No PCP Per Patient   LOS - 1  Subjective: Patient admitted with abdominal pain. Reports abdominal pain is improved but complains of abdominal distention.    Review of Systems:   CONSTITUTIONAL: No documented fever. No fatigue, weakness. No weight gain, no weight loss.  EYES: No blurry or double vision.  ENT: No tinnitus. No postnasal drip. No redness of the oropharynx.  RESPIRATORY: No cough, no wheeze, no hemoptysis. No dyspnea.  CARDIOVASCULAR: No chest pain. No orthopnea. No palpitations. No syncope.  GASTROINTESTINAL: No nausea, no vomiting or diarrhea. No abdominal pain. No melena or hematochezia. Abdominal distention GENITOURINARY: No dysuria or hematuria.  ENDOCRINE: No polyuria or nocturia. No heat or cold intolerance.  HEMATOLOGY: No anemia. No bruising. No bleeding.  INTEGUMENTARY: No rashes. No lesions.  MUSCULOSKELETAL: No arthritis. No swelling. No gout.  NEUROLOGIC: No numbness, tingling, or ataxia. No seizure-type activity.  PSYCHIATRIC: No anxiety. No insomnia. No ADD.    Vitals:   Vitals:   12/17/16 0045 12/17/16 0100 12/17/16 0533 12/17/16 0819  BP:   122/75 98/66  Pulse:   90 92  Resp:   20 18  Temp: (!) 102.6 F (39.2 C) 100.1 F (37.8 C) 98.5 F (36.9 C) 99.3 F (37.4 C)  TempSrc: Oral  Oral Oral  SpO2:   98% 98%  Weight:      Height:        Wt Readings from Last 3 Encounters:  12/16/16 190 lb (86.2 kg)     Intake/Output Summary (Last 24 hours) at 12/17/16 1238 Last data filed at 12/17/16 0950  Gross per 24 hour   Intake          3360.83 ml  Output             1150 ml  Net          2210.83 ml    Physical Exam:   GENERAL: Pleasant-appearing in no apparent distress.  HEAD, EYES, EARS, NOSE AND THROAT: Atraumatic, normocephalic. Extraocular muscles are intact. Pupils equal and reactive to light. Sclerae anicteric. No conjunctival injection. No oro-pharyngeal erythema.  NECK: Supple. There is no jugular venous distention. No bruits, no lymphadenopathy, no thyromegaly.  HEART: Regular rate and  rhythm,. No murmurs, no rubs, no clicks.  LUNGS: Clear to auscultation bilaterally. No rales or rhonchi. No wheezes.  ABDOMEN: Soft, flat, nontender, distended. Has good bowel sounds. No hepatosplenomegaly appreciated.  EXTREMITIES: No evidence of any cyanosis, clubbing, or peripheral edema.  +2 pedal and radial pulses bilaterally.  NEUROLOGIC: The patient is alert, awake, and oriented x3 with no focal motor or sensory deficits appreciated bilaterally.  SKIN: Moist and warm with no rashes appreciated.  Psych: Not anxious, depressed LN: No inguinal LN enlargement    Antibiotics   Anti-infectives    Start     Dose/Rate Route Frequency Ordered Stop   12/17/16 1000  Ampicillin-Sulbactam (UNASYN) 3 g in sodium chloride 0.9 % 100 mL IVPB     3 g 200 mL/hr over 30 Minutes Intravenous Every 6 hours 12/17/16 0940     12/16/16 1430  ciprofloxacin (CIPRO) IVPB 400 mg     400 mg 200 mL/hr over 60 Minutes Intravenous  Once 12/16/16 1418 12/16/16 1609   12/16/16 1430  metroNIDAZOLE (FLAGYL) tablet 500 mg     500 mg Oral Every 8 hours 12/16/16 1418        Medications   Scheduled Meds: . ampicillin-sulbactam (UNASYN) IV  3 g Intravenous Q6H  . artificial tears  1 application Right Eye BID  . atropine  1 drop Right Eye BID  . benztropine  2 mg Oral QPM  . enoxaparin (LOVENOX) injection  40 mg Subcutaneous Q24H  . latanoprost  2 drop Right Eye QHS  . LORazepam  0.5 mg Oral TID  . metroNIDAZOLE  500 mg Oral Q8H   . moxifloxacin  1 drop Right Eye TID  . potassium chloride  20 mEq Oral BID  . prednisoLONE acetate  1 drop Right Eye QID  . simvastatin  40 mg Oral q1800  . thiothixene  10 mg Oral BID   Continuous Infusions: . 0.9 % NaCl with KCl 20 mEq / L 50 mL/hr at 12/17/16 0936   PRN Meds:.acetaminophen **OR** acetaminophen, morphine injection, ondansetron **OR** ondansetron (ZOFRAN) IV   Data Review:   Micro Results No results found for this or any previous visit (from the past 240 hour(s)).  Radiology Reports Ct Abdomen Pelvis W Contrast  Result Date: 12/16/2016 CLINICAL DATA:  Left side abdominal pain EXAM: CT ABDOMEN AND PELVIS WITH CONTRAST TECHNIQUE: Multidetector CT imaging of the abdomen and pelvis was performed using the standard protocol following bolus administration of intravenous contrast. CONTRAST:  143mL ISOVUE-300 IOPAMIDOL (ISOVUE-300) INJECTION 61% COMPARISON:  None. FINDINGS: Lower chest: Linear atelectasis or scarring at the left base. Heart is normal size. No effusions. Hepatobiliary: Gallstones fill the gallbladder. No focal hepatic abnormality. Pancreas: Stranding around the pancreatic body and tail compatible with acute pancreatitis. No ductal dilatation or focal abnormality. Spleen: No focal abnormality.  Normal size. Adrenals/Urinary Tract: No adrenal abnormality. No focal renal abnormality. No stones or hydronephrosis. Urinary bladder is unremarkable. Stomach/Bowel: Appendix is normal. Stomach, large and small bowel grossly unremarkable. Vascular/Lymphatic: No evidence of aneurysm or adenopathy. Scattered aortic and iliac calcifications. Reproductive: Prior hysterectomy.  No adnexal masses. Other: No free fluid or free air. Musculoskeletal: No acute bony abnormality. IMPRESSION: Stranding around the pancreatic body and tail compatible with acute pancreatitis. Cholelithiasis. Electronically Signed   By: Rolm Baptise M.D.   On: 12/16/2016 11:50   US Abdomen Limited  Ruq  Result Date: 12/16/2016 CLINICAL DATA:  Pancreatitis, cholelithiasis EXAM: US ABDOMEN LIMITED - RIGHT UPPER QUADRANT COMPARISON:  12/16/2016 FINDINGS: Gallbladder:  The gallbladder is collapsed but contains numerous small echogenic shadowing gallstones. Gallbladder wall is thickened measuring 5.4 mm, a degree of this may be secondary to collapse. No Murphy's sign elicited. No pericholecystic fluid. Common bile duct: Diameter: 10.6 mm.  Difficult to exclude distal choledocholithiasis. Liver: No focal hepatic abnormality or intrahepatic biliary dilatation. Patent hepatic and portal veins. No fluid collection. IMPRESSION: Collapsed gallbladder containing numerous small calcified gallstones. Mild wall thickening measuring 5.4 mm. No elicited Murphy sign or pericholecystic fluid. 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 Dilated common bile duct measuring 10.6 mm. Difficult to exclude distal choledocholithiasis. No hepatic abnormality. Electronically Signed   By: Jerilynn Mages.  Shick M.D.   On: 12/16/2016 13:03     CBC  Recent Labs Lab 12/16/16 1006 12/17/16 0459  WBC 13.3* 13.9*  HGB 11.6* 10.6*  HCT 35.3 31.3*  PLT 238 236  MCV 90.2 89.6  MCH 29.5 30.4  MCHC 32.7 33.9  RDW 14.9* 15.1*    Chemistries   Recent Labs Lab 12/16/16 1006 12/17/16 0459  NA 137 137  K 3.2* 3.7  CL 101 101  CO2 29 28  GLUCOSE 167* 120*  BUN 14 9  CREATININE 1.15* 1.06*  CALCIUM 8.5* 7.8*  MG 2.3  --   AST 29 24  ALT 69* 53  ALKPHOS 132* 116  BILITOT 0.6 0.8   ------------------------------------------------------------------------------------------------------------------ estimated creatinine clearance is 59.6 mL/min (by C-G formula based on SCr of 1.06 mg/dL (H)). ------------------------------------------------------------------------------------------------------------------ No results for input(s): HGBA1C in the last 72  hours. ------------------------------------------------------------------------------------------------------------------ No results for input(s): CHOL, HDL, LDLCALC, TRIG, CHOLHDL, LDLDIRECT in the last 72 hours. ------------------------------------------------------------------------------------------------------------------ No results for input(s): TSH, T4TOTAL, T3FREE, THYROIDAB in the last 72 hours.  Invalid input(s): FREET3 ------------------------------------------------------------------------------------------------------------------ No results for input(s): VITAMINB12, FOLATE, FERRITIN, TIBC, IRON, RETICCTPCT in the last 72 hours.  Coagulation profile No results for input(s): INR, PROTIME in the last 168 hours.  No results for input(s): DDIMER in the last 72 hours.  Cardiac Enzymes No results for input(s): CKMB, TROPONINI, MYOGLOBIN in the last 168 hours.  Invalid input(s): CK ------------------------------------------------------------------------------------------------------------------ Invalid input(s): POCBNP    Assessment & Plan   56 year old female with past history of paranoid schizophrenia, obesity, hypertension and presents to the hospital due to left upper quadrant abdominal pain.  1. Left upper quadrant abdominal pain-Patient noted to have some pancreatic inflammation. Ultrasound shows findings consistent with chronic cholecystitis- seen by surgery I have discussed the case with gastroenterology they recommended MRCP if MRCP is abnormal then ERCP will be performed Patient will likely need cholecystectomy We'll start her on Unasyn   2. Hypokalemia-replaced -Check magnesium level.  3. Acute kidney injury-secondary to dehydration. Improved with IV hydration  4. Leukocytosis - likely stress mediated from LUQ pain.   - will monitor.    5. Glaucoma - cont. Xalantan, Vigamox eye drops.    6. Hyperlipidemia - cont. Zocor.   All the records are  reviewed and case discussed with ED provider. Management plans discussed with the patient, family and they are in agreement.      Code Status Orders        Start     Ordered   12/16/16 1643  Full code  Continuous     12/16/16 1642    Code Status History    Date Active Date Inactive Code Status Order ID Comments User Context   This patient has a current code status but no historical code status.  Consults  Gi and surgery   DVT Prophylaxis  Lovenox   Lab Results  Component Value Date   PLT 236 12/17/2016     Time Spent in minutes   37min  Greater than 50% of time spent in care coordination and counseling patient regarding the condition and plan of care.   Dustin Flock M.D on 12/17/2016 at 12:38 PM  Between 7am to 6pm - Pager - [phone]  After 6pm go to [email or web address] - password [email or web address] [facility]   Office  [phone]

## 2016-12-18 ENCOUNTER — Inpatient Hospital Stay: Payer: Medicare HMO

## 2016-12-18 MED ORDER — FUROSEMIDE 10 MG/ML IJ SOLN
20.0000 mg | Freq: Two times a day (BID) | INTRAMUSCULAR | Status: DC
  Administered 2016-12-18 – 2016-12-19 (×2): 20 mg via INTRAVENOUS
  Filled 2016-12-18 (×2): qty 2

## 2016-12-18 NOTE — Progress Notes (Signed)
[place] at [facility]                                                                                                                                                                                  Patient Demographics   Kylie Saunders, is a 56 y.o. female, DOB - 1961-08-12, [id number]  Admit date - 12/16/2016   Admitting Physician Henreitta Leber, MD  Outpatient Primary MD for the patient is No PCP Per Patient   LOS - 2  Subjective: Patient feeling better denies any abdominal pain but requiring oxygen therapy.    Review of Systems:   CONSTITUTIONAL: No documented fever. No fatigue, weakness. No weight gain, no weight loss.  EYES: No blurry or double vision.  ENT: No tinnitus. No postnasal drip. No redness of the oropharynx.  RESPIRATORY: No cough, no wheeze, no hemoptysis. No dyspnea.  CARDIOVASCULAR: No chest pain. No orthopnea. No palpitations. No syncope.  GASTROINTESTINAL: No nausea, no vomiting or diarrhea. No abdominal pain. No melena or hematochezia. Abdominal distention GENITOURINARY: No dysuria or hematuria.  ENDOCRINE: No polyuria or nocturia. No heat or cold intolerance.  HEMATOLOGY: No anemia. No bruising. No bleeding.  INTEGUMENTARY: No rashes. No lesions.  MUSCULOSKELETAL: No arthritis. No swelling. No gout.  NEUROLOGIC: No numbness, tingling, or ataxia. No seizure-type activity.  PSYCHIATRIC: No anxiety. No insomnia. No ADD.    Vitals:   Vitals:   12/18/16 0347 12/18/16 0823 12/18/16 1300 12/18/16 1344  BP: (!) 148/72 113/67 129/72 113/81  Pulse: (!) 103 97 95 68  Resp: 20 20 18    Temp: 99.6 F (37.6 C) 98.9 F (37.2 C) 98.8 F (37.1 C)   TempSrc: Oral Oral Oral   SpO2: 98% 97% 95% 96%  Weight:      Height:        Wt Readings from Last 3 Encounters:  12/16/16 190 lb (86.2 kg)     Intake/Output Summary (Last 24 hours) at 12/18/16 1417 Last data filed at 12/18/16 0600  Gross per 24 hour  Intake          2671.33 ml   Output             1050 ml  Net          1621.33 ml    Physical Exam:   GENERAL: Pleasant-appearing in no apparent distress.  HEAD, EYES, EARS, NOSE AND THROAT: Atraumatic, normocephalic. Extraocular muscles are intact. Pupils equal and reactive to light. Sclerae anicteric. No conjunctival injection. No oro-pharyngeal erythema.  NECK: Supple. There is no jugular venous distention. No bruits, no lymphadenopathy, no thyromegaly.  HEART: Regular rate and rhythm,. No murmurs, no rubs, no  clicks.  LUNGS: Clear to auscultation bilaterally. No rales or rhonchi. No wheezes.  ABDOMEN: Soft, flat, nontender, distended. Has good bowel sounds. No hepatosplenomegaly appreciated.  EXTREMITIES: No evidence of any cyanosis, clubbing, or peripheral edema.  +2 pedal and radial pulses bilaterally.  NEUROLOGIC: The patient is alert, awake, and oriented x3 with no focal motor or sensory deficits appreciated bilaterally.  SKIN: Moist and warm with no rashes appreciated.  Psych: Not anxious, depressed LN: No inguinal LN enlargement    Antibiotics   Anti-infectives    Start     Dose/Rate Route Frequency Ordered Stop   12/17/16 1000  Ampicillin-Sulbactam (UNASYN) 3 g in sodium chloride 0.9 % 100 mL IVPB     3 g 200 mL/hr over 30 Minutes Intravenous Every 6 hours 12/17/16 0940     12/16/16 1430  ciprofloxacin (CIPRO) IVPB 400 mg     400 mg 200 mL/hr over 60 Minutes Intravenous  Once 12/16/16 1418 12/16/16 1609   12/16/16 1430  metroNIDAZOLE (FLAGYL) tablet 500 mg     500 mg Oral Every 8 hours 12/16/16 1418        Medications   Scheduled Meds: . ampicillin-sulbactam (UNASYN) IV  3 g Intravenous Q6H  . artificial tears  1 application Right Eye BID  . atropine  1 drop Right Eye BID  . benztropine  2 mg Oral QPM  . enoxaparin (LOVENOX) injection  40 mg Subcutaneous Q24H  . latanoprost  2 drop Right Eye QHS  . LORazepam  0.5 mg Oral TID  . metroNIDAZOLE  500 mg Oral Q8H  . moxifloxacin  1 drop Right  Eye TID  . potassium chloride  20 mEq Oral BID  . prednisoLONE acetate  1 drop Right Eye QID  . simvastatin  40 mg Oral q1800  . thiothixene  10 mg Oral BID   Continuous Infusions: . 0.9 % NaCl with KCl 20 mEq / L 50 mL/hr at 12/17/16 1355   PRN Meds:.acetaminophen **OR** acetaminophen, morphine injection, ondansetron **OR** ondansetron (ZOFRAN) IV   Data Review:   Micro Results No results found for this or any previous visit (from the past 240 hour(s)).  Radiology Reports Ct Abdomen Pelvis W Contrast  Result Date: 12/16/2016 CLINICAL DATA:  Left side abdominal pain EXAM: CT ABDOMEN AND PELVIS WITH CONTRAST TECHNIQUE: Multidetector CT imaging of the abdomen and pelvis was performed using the standard protocol following bolus administration of intravenous contrast. CONTRAST:  180mL ISOVUE-300 IOPAMIDOL (ISOVUE-300) INJECTION 61% COMPARISON:  None. FINDINGS: Lower chest: Linear atelectasis or scarring at the left base. Heart is normal size. No effusions. Hepatobiliary: Gallstones fill the gallbladder. No focal hepatic abnormality. Pancreas: Stranding around the pancreatic body and tail compatible with acute pancreatitis. No ductal dilatation or focal abnormality. Spleen: No focal abnormality.  Normal size. Adrenals/Urinary Tract: No adrenal abnormality. No focal renal abnormality. No stones or hydronephrosis. Urinary bladder is unremarkable. Stomach/Bowel: Appendix is normal. Stomach, large and small bowel grossly unremarkable. Vascular/Lymphatic: No evidence of aneurysm or adenopathy. Scattered aortic and iliac calcifications. Reproductive: Prior hysterectomy.  No adnexal masses. Other: No free fluid or free air. Musculoskeletal: No acute bony abnormality. IMPRESSION: Stranding around the pancreatic body and tail compatible with acute pancreatitis. Cholelithiasis. Electronically Signed   By: Rolm Baptise M.D.   On: 12/16/2016 11:50   US Abdomen Limited Ruq  Result Date: 12/16/2016 CLINICAL  DATA:  Pancreatitis, cholelithiasis EXAM: US ABDOMEN LIMITED - RIGHT UPPER QUADRANT COMPARISON:  12/16/2016 FINDINGS: Gallbladder: The gallbladder is collapsed but contains  numerous small echogenic shadowing gallstones. Gallbladder wall is thickened measuring 5.4 mm, a degree of this may be secondary to collapse. No Murphy's sign elicited. No pericholecystic fluid. Common bile duct: Diameter: 10.6 mm.  Difficult to exclude distal choledocholithiasis. Liver: No focal hepatic abnormality or intrahepatic biliary dilatation. Patent hepatic and portal veins. No fluid collection. IMPRESSION: Collapsed gallbladder containing numerous small calcified gallstones. Mild wall thickening measuring 5.4 mm. No elicited Murphy sign or pericholecystic fluid. 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 Dilated common bile duct measuring 10.6 mm. Difficult to exclude distal choledocholithiasis. No hepatic abnormality. Electronically Signed   By: Jerilynn Mages.  Shick M.D.   On: 12/16/2016 13:03     CBC  Recent Labs Lab 12/16/16 1006 12/17/16 0459  WBC 13.3* 13.9*  HGB 11.6* 10.6*  HCT 35.3 31.3*  PLT 238 236  MCV 90.2 89.6  MCH 29.5 30.4  MCHC 32.7 33.9  RDW 14.9* 15.1*    Chemistries   Recent Labs Lab 12/16/16 1006 12/17/16 0459  NA 137 137  K 3.2* 3.7  CL 101 101  CO2 29 28  GLUCOSE 167* 120*  BUN 14 9  CREATININE 1.15* 1.06*  CALCIUM 8.5* 7.8*  MG 2.3  --   AST 29 24  ALT 69* 53  ALKPHOS 132* 116  BILITOT 0.6 0.8   ------------------------------------------------------------------------------------------------------------------ estimated creatinine clearance is 59.6 mL/min (by C-G formula based on SCr of 1.06 mg/dL (H)). ------------------------------------------------------------------------------------------------------------------ No results for input(s): HGBA1C in the last 72  hours. ------------------------------------------------------------------------------------------------------------------ No results for input(s): CHOL, HDL, LDLCALC, TRIG, CHOLHDL, LDLDIRECT in the last 72 hours. ------------------------------------------------------------------------------------------------------------------ No results for input(s): TSH, T4TOTAL, T3FREE, THYROIDAB in the last 72 hours.  Invalid input(s): FREET3 ------------------------------------------------------------------------------------------------------------------ No results for input(s): VITAMINB12, FOLATE, FERRITIN, TIBC, IRON, RETICCTPCT in the last 72 hours.  Coagulation profile No results for input(s): INR, PROTIME in the last 168 hours.  No results for input(s): DDIMER in the last 72 hours.  Cardiac Enzymes No results for input(s): CKMB, TROPONINI, MYOGLOBIN in the last 168 hours.  Invalid input(s): CK ------------------------------------------------------------------------------------------------------------------ Invalid input(s): POCBNP    Assessment & Plan   56 year old female with past history of paranoid schizophrenia, obesity, hypertension and presents to the hospital due to left upper quadrant abdominal pain.  1. Left upper quadrant abdominal pain-Patient noted to have some pancreatic inflammation. Ultrasound shows findings consistent with chronic cholecystitis- seen by surgery MRCP today Patient will likely need cholecystectomy Continue Unasyn   2. Oxygen requirement We will try to wean oxygen obtain a chest x-ray   3. Acute kidney injury-secondary to dehydration. Improved with IV hydration  4. Leukocytosis - likely stress mediated from LUQ pain.   - will monitor.    5. Glaucoma - cont. Xalantan, Vigamox eye drops.    6. Hyperlipidemia - cont. Zocor.   All the records are reviewed and case discussed with ED provider. Management plans discussed with the patient,  family and they are in agreement.      Code Status Orders        Start     Ordered   12/16/16 1643  Full code  Continuous     12/16/16 1642    Code Status History    Date Active Date Inactive Code Status Order ID Comments User Context   This patient has a current code status but no historical code status.           Consults  Gi and surgery   DVT Prophylaxis  Lovenox   Lab Results  Component Value Date   PLT 236 12/17/2016     Time Spent in minutes   72min  Greater than 50% of time spent in care coordination and counseling patient regarding the condition and plan of care.   Dustin Flock M.D on 12/18/2016 at 2:17 PM  Between 7am to 6pm - Pager - [phone]  After 6pm go to [email or web address] - password [email or web address] [facility]   Office  [phone]

## 2016-12-18 NOTE — Clinical Social Work Note (Signed)
Clinical Social Work Assessment  Patient Details  Name: Kylie Saunders MRN: [id number] Date of Birth: 1961/01/18  Date of referral:  12/18/16               Reason for consult:  Facility Placement                Permission sought to share information with:    Permission granted to share information::     Name::        Agency::     Relationship::     Contact Information:     Housing/Transportation Living arrangements for the past 2 months:  Group Home Source of Information:  Facility (SiblingSaralyn Pilar Krider: [phone]) Patient Interpreter Needed:  None Criminal Activity/Legal Involvement Pertinent to Current Situation/Hospitalization:  No - Comment as needed Significant Relationships:  Siblings Lives with:  Facility Resident Do you feel safe going back to the place where you live?  Yes Need for family participation in patient care:  Yes (Comment)  Care giving concerns:  Patient is a resident of [facility].   Social Worker assessment / plan:  CSW contacted the group home owner: Hayden [phone] and was informed that patient can return to the group home at discharge. Mr. Lennox Grumbles informed CSW that patient has been a resident of his for 4 years. CSW informed by Mr. Lennox Grumbles that patient's guardian or POA (he could not remember) is her brother: Saralyn Pilar [place]: [phone]. CSW contacted Mr. Magadan and he confirmed that he wanted patient to return to her group home when time. Mr. Arehart had no questions of CSW at this time.  Employment status:  Disabled (Comment on whether or not currently receiving Disability) Insurance information:  Managed Medicare PT Recommendations:  Not assessed at this time Information / Referral to community resources:     Patient/Family's Response to care:  Patient's brother expressed appreciation for CSW phone call.  Patient/Family's Understanding of and Emotional Response to Diagnosis, Current Treatment, and Prognosis:  Patient's brother  had no questions of CSW however, Mr. Lennox Grumbles informed CSW that patient's brother is actively involved in patient's care and well-being.   Emotional Assessment Appearance:    Attitude/Demeanor/Rapport:    Affect (typically observed):    Orientation:  Oriented to Self, Oriented to Place Alcohol / Substance use:  Not Applicable Psych involvement (Current and /or in the community):  Outpatient Provider  Discharge Needs  Concerns to be addressed:  Care Coordination Readmission within the last 30 days:  No Current discharge risk:  None Barriers to Discharge:  No Barriers Identified   Shela Leff, LCSW 12/18/2016, 12:08 PM

## 2016-12-18 NOTE — Progress Notes (Signed)
CC: Left Upper quadrant pain Subjective: This patient with left upper quadrant pain and workup showing pancreatitis possibly biliary tract in origin. No new labs were drawn today his labs had begun to normalize. Today she states that she has minimal pain and no nausea or vomiting. Objective: Vital signs in last 24 hours: Temp:  [98.4 F (36.9 C)-99.6 F (37.6 C)] 98.9 F (37.2 C) (01/29 0823) Pulse Rate:  [94-103] 97 (01/29 0823) Resp:  [18-21] 20 (01/29 0823) BP: (107-151)/(57-83) 113/67 (01/29 0823) SpO2:  [97 %-99 %] 97 % (01/29 0823) Last BM Date: 12/17/16  Intake/Output from previous day: 01/28 0701 - 01/29 0700 In: 3391.3 [P.O.:1320; I.V.:1671.3; IV Piggyback:400] Out: 2050 [Urine:2050] Intake/Output this shift: No intake/output data recorded.  Physical exam:  Vital signs reviewed and stable no icterus no jaundice in general the patient appears in no acute distress. Abdominal exam shows distended nontympanitic protuberant abdomen which is nontender with a negative Murphy sign Calves are nontender  Patient is on supplemental oxygen  Lab Results: CBC   Recent Labs  12/16/16 1006 12/17/16 0459  WBC 13.3* 13.9*  HGB 11.6* 10.6*  HCT 35.3 31.3*  PLT 238 236   BMET  Recent Labs  12/16/16 1006 12/17/16 0459  NA 137 137  K 3.2* 3.7  CL 101 101  CO2 29 28  GLUCOSE 167* 120*  BUN 14 9  CREATININE 1.15* 1.06*  CALCIUM 8.5* 7.8*   PT/INR No results for input(s): LABPROT, INR in the last 72 hours. ABG No results for input(s): PHART, HCO3 in the last 72 hours.  Invalid input(s): PCO2, PO2  Studies/Results: No results found.  Anti-infectives: Anti-infectives    Start     Dose/Rate Route Frequency Ordered Stop   12/17/16 1000  Ampicillin-Sulbactam (UNASYN) 3 g in sodium chloride 0.9 % 100 mL IVPB     3 g 200 mL/hr over 30 Minutes Intravenous Every 6 hours 12/17/16 0940     12/16/16 1430  ciprofloxacin (CIPRO) IVPB 400 mg     400 mg 200 mL/hr over 60  Minutes Intravenous  Once 12/16/16 1418 12/16/16 1609   12/16/16 1430  metroNIDAZOLE (FLAGYL) tablet 500 mg     500 mg Oral Every 8 hours 12/16/16 1418        Assessment/Plan:  No new labs for evaluation. Patient doing well at this point on supplemental oxygen however. The need for emergent or urgent cholecystectomy is minimal at this point and the patient could easily be seen as an outpatient and consideration of cholecystectomy with cholangiography be performed. At this point I see no need for an ERCP in this patient nor do I see the need for emergent surgical intervention either. Will follow while in hospital  Florene Glen, MD, FACS  12/18/2016

## 2016-12-19 ENCOUNTER — Inpatient Hospital Stay: Payer: Medicare HMO

## 2016-12-19 DIAGNOSIS — K851 Biliary acute pancreatitis without necrosis or infection: Principal | ICD-10-CM

## 2016-12-19 LAB — CBC WITH DIFFERENTIAL/PLATELET
BASOS ABS: 0.1 10*3/uL (ref 0–0.1)
BASOS PCT: 1 %
EOS PCT: 3 %
Eosinophils Absolute: 0.4 10*3/uL (ref 0–0.7)
HEMATOCRIT: 33.6 % — AB (ref 35.0–47.0)
Hemoglobin: 11.1 g/dL — ABNORMAL LOW (ref 12.0–16.0)
LYMPHS PCT: 12 %
Lymphs Abs: 1.4 10*3/uL (ref 1.0–3.6)
MCH: 29.9 pg (ref 26.0–34.0)
MCHC: 33.1 g/dL (ref 32.0–36.0)
MCV: 90.5 fL (ref 80.0–100.0)
Monocytes Absolute: 1 10*3/uL — ABNORMAL HIGH (ref 0.2–0.9)
Monocytes Relative: 9 %
NEUTROS ABS: 8.8 10*3/uL — AB (ref 1.4–6.5)
Neutrophils Relative %: 75 %
PLATELETS: 270 10*3/uL (ref 150–440)
RBC: 3.71 MIL/uL — AB (ref 3.80–5.20)
RDW: 15.5 % — AB (ref 11.5–14.5)
WBC: 11.7 10*3/uL — AB (ref 3.6–11.0)

## 2016-12-19 LAB — COMPREHENSIVE METABOLIC PANEL
ALBUMIN: 2.5 g/dL — AB (ref 3.5–5.0)
ALT: 30 U/L (ref 14–54)
AST: 13 U/L — AB (ref 15–41)
Alkaline Phosphatase: 106 U/L (ref 38–126)
Anion gap: 6 (ref 5–15)
BUN: 9 mg/dL (ref 6–20)
CHLORIDE: 100 mmol/L — AB (ref 101–111)
CO2: 34 mmol/L — ABNORMAL HIGH (ref 22–32)
CREATININE: 1.13 mg/dL — AB (ref 0.44–1.00)
Calcium: 8.1 mg/dL — ABNORMAL LOW (ref 8.9–10.3)
GFR calc Af Amer: >60 mL/min (ref >60)
GFR, EST NON AFRICAN AMERICAN: 53 mL/min — AB (ref >60)
GLUCOSE: 96 mg/dL (ref 65–99)
POTASSIUM: 4.1 mmol/L (ref 3.5–5.1)
Sodium: 140 mmol/L (ref 135–145)
Total Bilirubin: 0.6 mg/dL (ref 0.3–1.2)
Total Protein: 6.3 g/dL — ABNORMAL LOW (ref 6.5–8.1)

## 2016-12-19 LAB — LIPASE, BLOOD: LIPASE: 66 U/L — AB (ref 11–51)

## 2016-12-19 MED ORDER — GADOBENATE DIMEGLUMINE 529 MG/ML IV SOLN
20.0000 mL | Freq: Once | INTRAVENOUS | Status: AC | PRN
  Administered 2016-12-19: 18 mL via INTRAVENOUS

## 2016-12-19 MED ORDER — AMOXICILLIN-POT CLAVULANATE 875-125 MG PO TABS: 1.0000 | ORAL_TABLET | Freq: Two times a day (BID) | ORAL | 0 refills | Status: AC

## 2016-12-19 NOTE — NC FL2 (Signed)
[place] LEVEL OF CARE SCREENING TOOL     IDENTIFICATION  Patient Name: Kylie Saunders Birthdate: 08/16/1961 Sex: female Admission Date (Current Location): 12/16/2016  [facility] and [place] Number:  Engineering geologist and Address:  [facility], [address] [place], [place] [address]      Provider Number: [id number]  Attending Physician Name and Address:  Dustin Flock, MD  Relative Name and Phone Number:       Current Level of Care: Hospital Recommended Level of Care:  (group home) Prior Approval Number:    Date Approved/Denied:   PASRR Number:    Discharge Plan:  (group home)    Current Diagnoses: Patient Active Problem List   Diagnosis Date Noted  . Left upper quadrant pain   . Acute pancreatitis   . Abdominal pain 12/16/2016  . HLD (hyperlipidemia) 07/25/2012  . Intellectual disability 07/25/2012  . Cancer of body of uterus ([place]) 05/31/2012  . Malignant neoplasm of body of uterus ([place]) 05/31/2012    Orientation RESPIRATION BLADDER Height & Weight     Self, Place  Normal Continent Weight: 190 lb (86.2 kg) Height:  5\' 2"  (157.5 cm)  BEHAVIORAL SYMPTOMS/MOOD NEUROLOGICAL BOWEL NUTRITION STATUS   (none)  (none) Incontinent Diet (regular)  AMBULATORY STATUS COMMUNICATION OF NEEDS Skin   Independent   Normal                       Personal Care Assistance Level of Assistance  Bathing, Dressing Bathing Assistance: Limited assistance   Dressing Assistance: Limited assistance     Functional Limitations Info   (no issues)          SPECIAL CARE FACTORS FREQUENCY                       Contractures Contractures Info: Not present    Additional Factors Info                   Discharge Medications   Allergies as of 12/19/2016   No Known Allergies        Medication List    TAKE these medications   amoxicillin-clavulanate 875-125 MG tablet Commonly known as:  AUGMENTIN Take 1 tablet  by mouth 2 (two) times daily.   artificial tears ointment Place 1 application into the right eye 2 (two) times daily.   atropine 1 % ophthalmic solution Place 1 drop into the right eye 2 (two) times daily.   benztropine 2 MG tablet Commonly known as:  COGENTIN Take 1 tablet (2 mg total) by mouth daily.   latanoprost 0.005 % ophthalmic solution Commonly known as:  XALATAN Place 2 drops into the right eye at bedtime.   LORazepam 0.5 MG tablet Commonly known as:  ATIVAN Take 1 tablet (0.5 mg total) by mouth 3 (three) times daily.   moxifloxacin 0.5 % ophthalmic solution Commonly known as:  VIGAMOX Place 1 drop into the right eye 3 (three) times daily.   prednisoLONE acetate 1 % ophthalmic suspension Commonly known as:  PRED FORTE Place 1 drop into the right eye 4 (four) times daily.   simvastatin 40 MG tablet Commonly known as:  ZOCOR Take 40 mg by mouth.   thiothixene 10 MG capsule Commonly known as:  NAVANE Take 1 capsule (10 mg total) by mouth 2 (two) times daily.   V-R IBUPROFEN JR 100 MG tablet Generic drug:  ibuprofen Take 600 mg by mouth.  Additional Information    Shela Leff, LCSW

## 2016-12-19 NOTE — Progress Notes (Signed)
CC: Biliary pancreatitis Subjective: Patient has no pain at this time no nausea vomiting. She is on supplemental oxygen.  Objective: Vital signs in last 24 hours: Temp:  [98.2 F (36.8 C)-102.9 F (39.4 C)] 99.7 F (37.6 C) (01/30 0857) Pulse Rate:  [68-109] 94 (01/30 0857) Resp:  [18-24] 20 (01/30 0857) BP: (113-147)/(66-81) 120/66 (01/30 0857) SpO2:  [89 %-97 %] 93 % (01/30 0857) Last BM Date: 12/18/16  Intake/Output from previous day: 01/29 0701 - 01/30 0700 In: 2638.3 [P.O.:1520; I.V.:918.3; IV Piggyback:200] Out: 1400 [Urine:1400] Intake/Output this shift: Total I/O In: 0  Out: 500 [Urine:500]  Physical exam:  No icterus no jaundice. Abdomen soft nontender. Calves are nontender  Lab Results: CBC   Recent Labs  12/17/16 0459 12/19/16 0457  WBC 13.9* 11.7*  HGB 10.6* 11.1*  HCT 31.3* 33.6*  PLT 236 270   BMET  Recent Labs  12/17/16 0459 12/19/16 0457  NA 137 140  K 3.7 4.1  CL 101 100*  CO2 28 34*  GLUCOSE 120* 96  BUN 9 9  CREATININE 1.06* 1.13*  CALCIUM 7.8* 8.1*   PT/INR No results for input(s): LABPROT, INR in the last 72 hours. ABG No results for input(s): PHART, HCO3 in the last 72 hours.  Invalid input(s): PCO2, PO2  Studies/Results: Dg Chest 2 View  Result Date: 12/18/2016 CLINICAL DATA:  Shortness of breath EXAM: CHEST  2 VIEW COMPARISON:  CT abdomen 12/16/2016 FINDINGS: There is mild bilateral interstitial thickening. There is a small left pleural effusion. There is no focal consolidation. The cardiomediastinal silhouette is normal. The osseous structures are unremarkable. IMPRESSION: Mild bilateral interstitial thickening and small left pleural effusion likely reflecting mild interstitial edema. Electronically Signed   By: Kathreen Devoid   On: 12/18/2016 16:19    Anti-infectives: Anti-infectives    Start     Dose/Rate Route Frequency Ordered Stop   12/17/16 1000  Ampicillin-Sulbactam (UNASYN) 3 g in sodium chloride 0.9 % 100 mL IVPB      3 g 200 mL/hr over 30 Minutes Intravenous Every 6 hours 12/17/16 0940     12/16/16 1430  ciprofloxacin (CIPRO) IVPB 400 mg     400 mg 200 mL/hr over 60 Minutes Intravenous  Once 12/16/16 1418 12/16/16 1609   12/16/16 1430  metroNIDAZOLE (FLAGYL) tablet 500 mg     500 mg Oral Every 8 hours 12/16/16 1418        Assessment/Plan:  Biliary pancreatitis. Patient would benefit from some time to decrease inflammatory process in her pancreas prior to performing surgery. She can be discharged on oral antibiotics and oral analgesics to follow up in our office in 10 days and schedule surgery electively. No need for urgent cholecystectomy in this patient  Florene Glen, MD, FACS  12/19/2016

## 2016-12-19 NOTE — Clinical Social Work Note (Signed)
Patient discharging today to return to her group home. Discharge information sent via the HUB to the group home.  Shela Leff MSW,LCSW [phone]

## 2016-12-19 NOTE — Progress Notes (Addendum)
Alert and oriented. Vital signs stable . No signs of acute distress. Discharge instructions given to care giver.  Patient and caregiver verbalized understanding. No other issues noted at this time.

## 2016-12-19 NOTE — Discharge Summary (Signed)
[facility] [place] [facility] [place], 56 y.o., DOB 1960/12/07, MRN [id number]. Admission date: 12/16/2016 Discharge Date 12/19/2016 Primary MD No PCP Per Patient Admitting Physician Henreitta Leber, MD  Admission Diagnosis  Left upper quadrant pain [R10.12] Acute pancreatitis, unspecified complication status, unspecified pancreatitis type [K85.90]  Discharge Diagnosis   Active Problems: Abdominal pain Left upper quadrant pain Acute pancreatitis Chronic cholecystitis HTN Obesity Paranoid schizophrenia        Hospital Course Kylie Saunders  is a 56 y.o. female with a known history of Paranoid schizophrenia, hypertension, obesity who presents to the hospital complaining of left upper quadrant abdominal pain ongoing for the past week. Patient was evaluated in the ED and had a CT scan of the abdomen which showed some pancreatic inflammation. Patient underwent ultrasound of the abdomen which showed possible chronic cholecystitis. She was admitted to the hospital and placed on antibiotics. Was seen in consultation by GI and surgery. Patient's abdominal pain is resolved. Surgery wants to see her as outpatient to decide on surgery at a later point. Patient currently asymptomatic and stable for discharge.   To note patient is having a MRCP didn't done prior to her discharge.         Consults  general surgery  Significant Tests:  See full reports for all details     Dg Chest 2 View  Result Date: 12/18/2016 CLINICAL DATA:  Shortness of breath EXAM: CHEST  2 VIEW COMPARISON:  CT abdomen 12/16/2016 FINDINGS: There is mild bilateral interstitial thickening. There is a small left pleural effusion. There is no focal consolidation. The cardiomediastinal silhouette is normal. The osseous structures are unremarkable. IMPRESSION: Mild bilateral interstitial thickening and small left pleural effusion likely reflecting mild interstitial edema. Electronically Signed   By:  Kathreen Devoid   On: 12/18/2016 16:19   Ct Abdomen Pelvis W Contrast  Result Date: 12/16/2016 CLINICAL DATA:  Left side abdominal pain EXAM: CT ABDOMEN AND PELVIS WITH CONTRAST TECHNIQUE: Multidetector CT imaging of the abdomen and pelvis was performed using the standard protocol following bolus administration of intravenous contrast. CONTRAST:  160mL ISOVUE-300 IOPAMIDOL (ISOVUE-300) INJECTION 61% COMPARISON:  None. FINDINGS: Lower chest: Linear atelectasis or scarring at the left base. Heart is normal size. No effusions. Hepatobiliary: Gallstones fill the gallbladder. No focal hepatic abnormality. Pancreas: Stranding around the pancreatic body and tail compatible with acute pancreatitis. No ductal dilatation or focal abnormality. Spleen: No focal abnormality.  Normal size. Adrenals/Urinary Tract: No adrenal abnormality. No focal renal abnormality. No stones or hydronephrosis. Urinary bladder is unremarkable. Stomach/Bowel: Appendix is normal. Stomach, large and small bowel grossly unremarkable. Vascular/Lymphatic: No evidence of aneurysm or adenopathy. Scattered aortic and iliac calcifications. Reproductive: Prior hysterectomy.  No adnexal masses. Other: No free fluid or free air. Musculoskeletal: No acute bony abnormality. IMPRESSION: Stranding around the pancreatic body and tail compatible with acute pancreatitis. Cholelithiasis. Electronically Signed   By: Rolm Baptise M.D.   On: 12/16/2016 11:50   US Abdomen Limited Ruq  Result Date: 12/16/2016 CLINICAL DATA:  Pancreatitis, cholelithiasis EXAM: US ABDOMEN LIMITED - RIGHT UPPER QUADRANT COMPARISON:  12/16/2016 FINDINGS: Gallbladder: The gallbladder is collapsed but contains numerous small echogenic shadowing gallstones. Gallbladder wall is thickened measuring 5.4 mm, a degree of this may be secondary to collapse. No Murphy's sign elicited. No pericholecystic fluid. Common bile duct: Diameter: 10.6 mm.  Difficult to exclude distal choledocholithiasis.  Liver: No focal hepatic abnormality or intrahepatic biliary dilatation. Patent hepatic and portal veins. No fluid  collection. IMPRESSION: Collapsed gallbladder containing numerous small calcified gallstones. Mild wall thickening measuring 5.4 mm. No elicited Murphy sign or pericholecystic fluid. 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 Dilated common bile duct measuring 10.6 mm. Difficult to exclude distal choledocholithiasis. No hepatic abnormality. Electronically Signed   By: Jerilynn Mages.  Shick M.D.   On: 12/16/2016 13:03       Today   Subjective:   Kylie Saunders  patient feels well no abdominal pain  Objective:   Blood pressure 120/66, pulse 94, temperature 99.7 F (37.6 C), temperature source Oral, resp. rate 20, height 5\' 2"  (1.575 m), weight 190 lb (86.2 kg), SpO2 93 %.  .  Intake/Output Summary (Last 24 hours) at 12/19/16 1236 Last data filed at 12/19/16 0714  Gross per 24 hour  Intake          2638.33 ml  Output             1900 ml  Net           738.33 ml    Exam VITAL SIGNS: Blood pressure 120/66, pulse 94, temperature 99.7 F (37.6 C), temperature source Oral, resp. rate 20, height 5\' 2"  (1.575 m), weight 190 lb (86.2 kg), SpO2 93 %.  GENERAL:  56 y.o.-year-old patient lying in the bed with no acute distress.  EYES: Pupils equal, round, reactive to light and accommodation. No scleral icterus. Extraocular muscles intact.  HEENT: Head atraumatic, normocephalic. Oropharynx and nasopharynx clear.  NECK:  Supple, no jugular venous distention. No thyroid enlargement, no tenderness.  LUNGS: Normal breath sounds bilaterally, no wheezing, rales,rhonchi or crepitation. No use of accessory muscles of respiration.  CARDIOVASCULAR: S1, S2 normal. No murmurs, rubs, or gallops.  ABDOMEN: Soft, nontender, nondistended. Bowel sounds present. No organomegaly or mass.  EXTREMITIES: No pedal edema, cyanosis, or clubbing.  NEUROLOGIC: Cranial nerves II through XII are intact.  Muscle strength 5/5 in all extremities. Sensation intact. Gait not checked.  PSYCHIATRIC: The patient is alert and oriented x 3.  SKIN: No obvious rash, lesion, or ulcer.   Data Review     CBC w Diff: Lab Results  Component Value Date   WBC 11.7 (H) 12/19/2016   HGB 11.1 (L) 12/19/2016   HCT 33.6 (L) 12/19/2016   HCT 38.3 06/05/2016   PLT 270 12/19/2016   PLT 249 06/05/2016   LYMPHOPCT 12 12/19/2016   MONOPCT 9 12/19/2016   EOSPCT 3 12/19/2016   BASOPCT 1 12/19/2016   CMP: Lab Results  Component Value Date   NA 140 12/19/2016   NA 141 06/05/2016   K 4.1 12/19/2016   CL 100 (L) 12/19/2016   CO2 34 (H) 12/19/2016   BUN 9 12/19/2016   BUN 8 06/05/2016   CREATININE 1.13 (H) 12/19/2016   PROT 6.3 (L) 12/19/2016   PROT 6.9 06/05/2016   ALBUMIN 2.5 (L) 12/19/2016   ALBUMIN 4.0 06/05/2016   BILITOT 0.6 12/19/2016   BILITOT <0.2 06/05/2016   ALKPHOS 106 12/19/2016   AST 13 (L) 12/19/2016   ALT 30 12/19/2016  .  Micro Results No results found for this or any previous visit (from the past 240 hour(s)).      Code Status Orders        Start     Ordered   12/16/16 1643  Full code  Continuous     12/16/16 1642    Code Status History    Date Active Date Inactive Code Status Order ID Comments User Context   This patient  has a current code status but no historical code status.          Follow-up Information    Phoebe Perch, MD Follow up on 12/29/2016.   Specialty:  Surgery Why:  Friday at 10:00am for hospital follow-up Contact information: [address] [place] [address] [phone]           Discharge Medications   Allergies as of 12/19/2016   No Known Allergies     Medication List    TAKE these medications   amoxicillin-clavulanate 875-125 MG tablet Commonly known as:  AUGMENTIN Take 1 tablet by mouth 2 (two) times daily.   artificial tears ointment Place 1 application into the right eye 2 (two) times daily.   atropine 1 %  ophthalmic solution Place 1 drop into the right eye 2 (two) times daily.   benztropine 2 MG tablet Commonly known as:  COGENTIN Take 1 tablet (2 mg total) by mouth daily.   latanoprost 0.005 % ophthalmic solution Commonly known as:  XALATAN Place 2 drops into the right eye at bedtime.   LORazepam 0.5 MG tablet Commonly known as:  ATIVAN Take 1 tablet (0.5 mg total) by mouth 3 (three) times daily.   moxifloxacin 0.5 % ophthalmic solution Commonly known as:  VIGAMOX Place 1 drop into the right eye 3 (three) times daily.   prednisoLONE acetate 1 % ophthalmic suspension Commonly known as:  PRED FORTE Place 1 drop into the right eye 4 (four) times daily.   simvastatin 40 MG tablet Commonly known as:  ZOCOR Take 40 mg by mouth.   thiothixene 10 MG capsule Commonly known as:  NAVANE Take 1 capsule (10 mg total) by mouth 2 (two) times daily.   V-R IBUPROFEN JR 100 MG tablet Generic drug:  ibuprofen Take 600 mg by mouth.          Total Time in preparing paper work, data evaluation and todays exam - 35 minutes  Dustin Flock M.D on 12/19/2016 at 12:36 PM  [facility]   Office  [phone]

## 2016-12-19 NOTE — Care Management Important Message (Signed)
Important Message  Patient Details  Name: Kylie Saunders MRN: [id number] Date of Birth: 1961/07/20   Medicare Important Message Given:  Yes    Beverly Sessions, RN 12/19/2016, 11:40 AM

## 2016-12-19 NOTE — Discharge Instructions (Signed)
[facility] [place] [facility] ° °DIET:  °Low fat, Low cholesterol diet ° °DISCHARGE CONDITION:  °Stable ° °ACTIVITY:  °Activity as tolerated ° °OXYGEN:  °Home Oxygen: No. °  °Oxygen Delivery: room air ° °DISCHARGE LOCATION:  °home  ° ° °ADDITIONAL DISCHARGE INSTRUCTION: ° ° °If you experience worsening of your admission symptoms, develop shortness of breath, life threatening emergency, suicidal or homicidal thoughts you must seek medical attention immediately by calling 911 or calling your MD immediately  if symptoms less severe. ° °You Must read complete instructions/literature along with all the possible adverse reactions/side effects for all the Medicines you take and that have been prescribed to you. Take any new Medicines after you have completely understood and accpet all the possible adverse reactions/side effects.  ° °Please note ° °You were cared for by a hospitalist during your hospital stay. If you have any questions about your discharge medications or the care you received while you were in the hospital after you are discharged, you can call the unit and asked to speak with the hospitalist on call if the hospitalist that took care of you is not available. Once you are discharged, your primary care physician will handle any further medical issues. Please note that NO REFILLS for any discharge medications will be authorized once you are discharged, as it is imperative that you return to your primary care physician (or establish a relationship with a primary care physician if you do not have one) for your aftercare needs so that they can reassess your need for medications and monitor your lab values. ° ° °

## 2016-12-19 NOTE — Progress Notes (Signed)
MRCP shows no biliary dilation or stones in CBD.  No indication for ERCP  I will sign off.  Please call me if any further GI concerns or questions.  We would like to thank you for the opportunity to participate in the care of [facility].   Dr Jonathon Bellows  Gastroenterology/Hepatology Pager: [phone]

## 2016-12-22 DIAGNOSIS — S0531XA Ocular laceration without prolapse or loss of intraocular tissue, right eye, initial encounter: Secondary | ICD-10-CM | POA: Insufficient documentation

## 2016-12-27 ENCOUNTER — Other Ambulatory Visit: Payer: Self-pay

## 2016-12-29 ENCOUNTER — Encounter (INDEPENDENT_AMBULATORY_CARE_PROVIDER_SITE_OTHER): Payer: Self-pay

## 2016-12-29 ENCOUNTER — Ambulatory Visit: Payer: Medicare HMO | Admitting: Surgery

## 2017-01-10 ENCOUNTER — Ambulatory Visit (INDEPENDENT_AMBULATORY_CARE_PROVIDER_SITE_OTHER): Payer: Medicare HMO | Admitting: Surgery

## 2017-01-10 ENCOUNTER — Encounter: Payer: Self-pay | Admitting: Surgery

## 2017-01-10 VITALS — BP 162/103 | HR 86 | Temp 99.2°F | Ht 62.0 in | Wt 177.6 lb

## 2017-01-10 DIAGNOSIS — K851 Biliary acute pancreatitis without necrosis or infection: Secondary | ICD-10-CM

## 2017-01-10 NOTE — Progress Notes (Signed)
Outpatient Surgical Follow Up  01/10/2017  Kylie Saunders is an 56 y.o. female.   CC: Biliary pancreatitis  HPI: This patient with a recent admission for pancreatitis believed to be biliary in nature. She was seen in the hospital at that time as a consult. She is here for follow-up. Her pain had resolved.   Past Medical History:  Diagnosis Date  . Acute pancreatitis   . Cancer of body of uterus ([place]) 05/31/2012  . HLD (hyperlipidemia) 07/25/2012  . Hypertension   . Intellectual disability 07/25/2012  . Obesity   . Paranoid schizophrenia ([place])   . Right eye injury 11/03/2016    Past Surgical History:  Procedure Laterality Date  . ABDOMINAL HYSTERECTOMY    . EYE SURGERY      Family History  Problem Relation Age of Onset  . Diabetes Neg Hx   . Hypertension Neg Hx     Social History:  reports that she has never smoked. She has never used smokeless tobacco. She reports that she does not drink alcohol or use drugs.  Allergies: No Known Allergies  Medications reviewed.   Review of Systems:   Review of Systems  Unable to perform ROS: Mental acuity  Psychiatric/Behavioral: Positive for hallucinations. The patient is nervous/anxious.      Physical Exam:  There were no vitals taken for this visit.  Physical Exam  Constitutional: She is well-developed, well-nourished, and in no distress. No distress.  Obese and hirsute  HENT:  Head: Normocephalic and atraumatic.  Eyes: Pupils are equal, round, and reactive to light. Right eye exhibits no discharge. Left eye exhibits no discharge. No scleral icterus.  Neck: Normal range of motion.  Cardiovascular: Normal rate, regular rhythm and normal heart sounds.   Pulmonary/Chest: Effort normal and breath sounds normal. No respiratory distress. She has no wheezes. She has no rales.  Abdominal: Soft. She exhibits no distension. There is no tenderness. There is no rebound.  Obese and protuberant  Musculoskeletal: Normal range of motion.  She exhibits no edema or tenderness.  Lymphadenopathy:    She has no cervical adenopathy.  Neurological: She is alert. Gait normal.  Skin: Skin is warm and dry. She is not diaphoretic. No erythema.  Psychiatric: Mood and affect normal.  Vitals reviewed.     No results found for this or any previous visit (from the past 48 hour(s)). No results found.  Assessment/Plan:  This a patient with biliary pancreatitis I recommended laparoscopic cholecystectomy with cholangiography to prevent this from happening again I discussed with she and her caregivers from the group home rationale for this operation and the risks of bleeding infection recurrence of symptoms failure to resolve her symptoms alternative causes for pancreatitis and the risk of bowel injury conversion to an open procedure or bile duct injury that understood and agreed with this plan.  Power of attorney is her brother who is available by telephone and consent will be obtained.  Kylie Glen, MD, FACS

## 2017-01-10 NOTE — Patient Instructions (Signed)
You have requested to have your gallbladder removed. We will arrange for this to be done on 01/24/17 at [facility] with Dr. Burt Knack.  You will most likely be out of work 1-2 weeks for this surgery. You will return after your post-op appointment with a lifting restriction for approximately 4 more weeks.  You will be able to eat anything you would like to following surgery. But, start by eating a bland diet and advance this as tolerated.  Please see the (blue)pre-care form that you have been given today. If you have any questions, please call our office.  Laparoscopic Cholecystectomy Laparoscopic cholecystectomy is surgery to remove the gallbladder. The gallbladder is located in the upper right part of the abdomen, behind the liver. It is a storage sac for bile, which is produced in the liver. Bile aids in the digestion and absorption of fats. Cholecystectomy is often done for inflammation of the gallbladder (cholecystitis). This condition is usually caused by a buildup of gallstones (cholelithiasis) in the gallbladder. Gallstones can block the flow of bile, and that can result in inflammation and pain. In severe cases, emergency surgery may be required. If emergency surgery is not required, you will have time to prepare for the procedure. Laparoscopic surgery is an alternative to open surgery. Laparoscopic surgery has a shorter recovery time. Your common bile duct may also need to be examined during the procedure. If stones are found in the common bile duct, they may be removed. LET [facility] CARE PROVIDER KNOW ABOUT:  Any allergies you have.  All medicines you are taking, including vitamins, herbs, eye drops, creams, and over-the-counter medicines.  Previous problems you or members of your family have had with the use of anesthetics.  Any blood disorders you have.  Previous surgeries you have had.    Any medical conditions you have. RISKS AND COMPLICATIONS Generally, this is a safe  procedure. However, problems may occur, including:  Infection.  Bleeding.  Allergic reactions to medicines.  Damage to other structures or organs.  A stone remaining in the common bile duct.  A bile leak from the cyst duct that is clipped when your gallbladder is removed.  The need to convert to open surgery, which requires a larger incision in the abdomen. This may be necessary if your surgeon thinks that it is not safe to continue with a laparoscopic procedure. BEFORE THE PROCEDURE  Ask your health care provider about:  Changing or stopping your regular medicines. This is especially important if you are taking diabetes medicines or blood thinners.  Taking medicines such as aspirin and ibuprofen. These medicines can thin your blood. Do not take these medicines before your procedure if your health care provider instructs you not to.  Follow instructions from your health care provider about eating or drinking restrictions.  Let your health care provider know if you develop a cold or an infection before surgery.  Plan to have someone take you home after the procedure.  Ask your health care provider how your surgical site will be marked or identified.  You may be given antibiotic medicine to help prevent infection. PROCEDURE  To reduce your risk of infection:  Your health care team will wash or sanitize their hands.  Your skin will be washed with soap.  An IV tube may be inserted into one of your veins.  You will be given a medicine to make you fall asleep (general anesthetic).  A breathing tube will be placed in your mouth.  The surgeon  will make several small cuts (incisions) in your abdomen.  A thin, lighted tube (laparoscope) that has a tiny camera on the end will be inserted through one of the small incisions. The camera on the laparoscope will send a picture to a TV screen (monitor) in the operating room. This will give the surgeon a good view inside your  abdomen.  A gas will be pumped into your abdomen. This will expand your abdomen to give the surgeon more room to perform the surgery.  Other tools that are needed for the procedure will be inserted through the other incisions. The gallbladder will be removed through one of the incisions.  After your gallbladder has been removed, the incisions will be closed with stitches (sutures), staples, or skin glue.  Your incisions may be covered with a bandage (dressing). The procedure may vary among health care providers and hospitals. AFTER THE PROCEDURE  Your blood pressure, heart rate, breathing rate, and blood oxygen level will be monitored often until the medicines you were given have worn off.  You will be given medicines as needed to control your pain.   This information is not intended to replace advice given to you by your health care provider. Make sure you discuss any questions you have with your health care provider.   Document Released: 11/06/2005 Document Revised: 07/28/2015 Document Reviewed: 06/18/2013 Elsevier Interactive Patient Education 2016 [place] Diet for Pancreatitis or Gallbladder Conditions A low-fat diet can be helpful if you have pancreatitis or a gallbladder condition. With these conditions, your pancreas and gallbladder have trouble digesting fats. A healthy eating plan with less fat will help rest your pancreas and gallbladder and reduce your symptoms. WHAT DO I NEED TO KNOW ABOUT THIS DIET?  Eat a low-fat diet.  Reduce your fat intake to less than 20-30% of your total daily calories. This is less than 50-60 g of fat per day.  Remember that you need some fat in your diet. Ask your dietician what your daily goal should be.  Choose nonfat and low-fat healthy foods. Look for the words "nonfat," "low fat," or "fat free."  As a guide, look on the label and choose foods with less than 3 g of fat per serving. Eat only one serving.  Avoid  alcohol.  Do not smoke. If you need help quitting, talk with your health care provider.  Eat small frequent meals instead of three large heavy meals. WHAT FOODS CAN I EAT? Grains Include healthy grains and starches such as potatoes, wheat bread, fiber-rich cereal, and brown rice. Choose whole grain options whenever possible. In adults, whole grains should account for 45-65% of your daily calories.  Fruits and Vegetables Eat plenty of fruits and vegetables. Fresh fruits and vegetables add fiber to your diet. Meats and Other Protein Sources Eat lean meat such as chicken and pork. Trim any fat off of meat before cooking it. Eggs, fish, and beans are other sources of protein. In adults, these foods should account for 10-35% of your daily calories. Dairy Choose low-fat milk and dairy options. Dairy includes fat and protein, as well as calcium.  Fats and Oils Limit high-fat foods such as fried foods, sweets, baked goods, sugary drinks.  Other Creamy sauces and condiments, such as mayonnaise, can add extra fat. Think about whether or not you need to use them, or use smaller amounts or low fat options. WHAT FOODS ARE NOT RECOMMENDED?  High fat foods, such as:  [employer].  Ice  cream.  French toast.  Sweet rolls.  Pizza.  Cheese bread.  Foods covered with batter, butter, creamy sauces, or cheese.  Fried foods.  Sugary drinks and desserts.  Foods that cause gas or bloating   This information is not intended to replace advice given to you by your health care provider. Make sure you discuss any questions you have with your health care provider.   Document Released: 11/11/2013 Document Reviewed: 11/11/2013 Elsevier Interactive Patient Education [employer].

## 2017-01-12 ENCOUNTER — Telehealth: Payer: Self-pay

## 2017-01-12 NOTE — Telephone Encounter (Signed)
Kylie Saunders [facility]) has been advised of Surgery Date as well as Pre-Admission appointment date, time, and location.  Surgery Date: 01/24/17  Pre-admit Appointment: 01/17/17 at 0930am (Office)  Patient has been advised to call [phone] the day before surgery between 1-3pm to obtain arrival time.  Pre-admit has been made aware to call Brother Kylie Saunders) for consents as he is POA for patient. [phone]. Spoke with [employer] in [employer].

## 2017-01-12 NOTE — Telephone Encounter (Signed)
NO Authorization required for CPT code [id number] per Haynes Hoehn D.

## 2017-01-17 ENCOUNTER — Encounter
Admission: RE | Admit: 2017-01-17 | Discharge: 2017-01-17 | Disposition: A | Discharge status: Home / Self Care | Payer: Medicare HMO | Source: Ambulatory Visit | Attending: Surgery | Admitting: Surgery

## 2017-01-17 DIAGNOSIS — Z01818 Encounter for other preprocedural examination: Secondary | ICD-10-CM | POA: Insufficient documentation

## 2017-01-17 DIAGNOSIS — K851 Biliary acute pancreatitis without necrosis or infection: Secondary | ICD-10-CM | POA: Insufficient documentation

## 2017-01-17 HISTORY — DX: Obstruction of bile duct: K85.90

## 2017-01-17 HISTORY — DX: Anxiety disorder, unspecified: F41.9

## 2017-01-17 HISTORY — DX: Obstruction of bile duct: K83.1

## 2017-01-17 LAB — COMPREHENSIVE METABOLIC PANEL
ALK PHOS: 68 U/L (ref 38–126)
ALT: 18 U/L (ref 14–54)
ANION GAP: 8 (ref 5–15)
AST: 16 U/L (ref 15–41)
Albumin: 3.8 g/dL (ref 3.5–5.0)
BUN: 12 mg/dL (ref 6–20)
CALCIUM: 9.2 mg/dL (ref 8.9–10.3)
CO2: 31 mmol/L (ref 22–32)
Chloride: 101 mmol/L (ref 101–111)
Creatinine, Ser: 1 mg/dL (ref 0.44–1.00)
Glucose, Bld: 114 mg/dL — ABNORMAL HIGH (ref 65–99)
POTASSIUM: 3.9 mmol/L (ref 3.5–5.1)
Sodium: 140 mmol/L (ref 135–145)
TOTAL PROTEIN: 7.5 g/dL (ref 6.5–8.1)
Total Bilirubin: 0.5 mg/dL (ref 0.3–1.2)

## 2017-01-17 LAB — CBC WITH DIFFERENTIAL/PLATELET
BASOS PCT: 1 %
Basophils Absolute: 0.1 10*3/uL (ref 0–0.1)
Eosinophils Absolute: 0.3 10*3/uL (ref 0–0.7)
Eosinophils Relative: 5 %
HCT: 38.6 % (ref 35.0–47.0)
HEMOGLOBIN: 12.8 g/dL (ref 12.0–16.0)
LYMPHS ABS: 1.7 10*3/uL (ref 1.0–3.6)
LYMPHS PCT: 25 %
MCH: 29.8 pg (ref 26.0–34.0)
MCHC: 33.2 g/dL (ref 32.0–36.0)
MCV: 89.7 fL (ref 80.0–100.0)
MONO ABS: 0.6 10*3/uL (ref 0.2–0.9)
MONOS PCT: 9 %
Neutro Abs: 4.1 10*3/uL (ref 1.4–6.5)
Neutrophils Relative %: 60 %
Platelets: 212 10*3/uL (ref 150–440)
RBC: 4.3 MIL/uL (ref 3.80–5.20)
RDW: 15.6 % — AB (ref 11.5–14.5)
WBC: 6.8 10*3/uL (ref 3.6–11.0)

## 2017-01-17 LAB — LIPASE, BLOOD: LIPASE: 48 U/L (ref 11–51)

## 2017-01-17 NOTE — Patient Instructions (Signed)
  Your procedure is scheduled on: January 24, 2017 (Wednesday) Report to Same Day Surgery 2nd floor medical mall [facility] Entrance-take elevator on left to 2nd floor.  Check in with surgery information desk.) To find out your arrival time please call [phone] between 1PM - 3PM on January 23, 2017 (Tuesday) Remember: Instructions that are not followed completely may result in serious medical risk, up to and including death, or upon the discretion of your surgeon and anesthesiologist your surgery may need to be rescheduled.    _x___ 1. Do not eat food or drink liquids after midnight. No gum chewing or hard candies.     __x__ 2. No Alcohol for 24 hours before or after surgery.   __x__3. No Smoking for 24 prior to surgery.   ____  4. Bring all medications with you on the day of surgery if instructed.    __x__ 5. Notify your doctor if there is any change in your medical condition     (cold, fever, infections).     Do not wear jewelry, make-up, hairpins, clips or nail polish.  Do not wear lotions, powders, or perfumes. You may wear deodorant.  Do not shave 48 hours prior to surgery. Men may shave face and neck.  Do not bring valuables to the hospital.    [facility] is not responsible for any belongings or valuables.               Contacts, dentures or bridgework may not be worn into surgery.  Leave your suitcase in the car. After surgery it may be brought to your room.  For patients admitted to the hospital, discharge time is determined by your treatment team.   Patients discharged the day of surgery will not be allowed to drive home.  You will need someone to drive you home and stay with you the night of your procedure.    Please read over the following fact sheets that you were given:   [facility] Preparing for Surgery and or MRSA Information   _x___ Take these medicines the morning of surgery with A SIP OF WATER:    1. Simvastatin  2.   Benztropine  3.  4.  5.  6.  ____Fleets enema or Magnesium Citrate as directed.   _x___ Use CHG Soap or sage wipes as directed on instruction sheet   ____ Use inhalers on the day of surgery and bring to hospital day of surgery  ____ Stop metformin 2 days prior to surgery    ____ Take 1/2 of usual insulin dose the night before surgery and none on the morning of           surgery.   ____ Stop Aspirin, Coumadin, Pllavix ,Eliquis, Effient, or Pradaxa  x__ Stop Anti-inflammatories such as Advil, Aleve, Ibuprofen, Motrin, Naproxen,          Naprosyn, Goodies powders or aspirin products. Ok to take Tylenol.   ____ Stop supplements until after surgery.    ____ Bring C-Pap to the hospital.

## 2017-01-17 NOTE — Pre-Procedure Instructions (Signed)
Informed consent faxed to patient legal guardian and returned with signature unwitness. Spoke to [place], Doctor, hospital and stated she will speak to Michail Jewels, Risk Management.

## 2017-01-23 MED ORDER — CEFAZOLIN SODIUM-DEXTROSE 2-4 GM/100ML-% IV SOLN
2.0000 g | INTRAVENOUS | Status: AC
  Administered 2017-01-24: 2 g via INTRAVENOUS

## 2017-01-24 ENCOUNTER — Encounter: Payer: Self-pay | Admitting: *Deleted

## 2017-01-24 ENCOUNTER — Ambulatory Visit: Payer: Medicare HMO | Admitting: Anesthesiology

## 2017-01-24 ENCOUNTER — Ambulatory Visit: Payer: Medicare HMO

## 2017-01-24 ENCOUNTER — Encounter
Admission: RE | Disposition: A | Discharge status: Home / Self Care | Payer: Self-pay | Source: Ambulatory Visit | Attending: Surgery

## 2017-01-24 ENCOUNTER — Observation Stay
Admission: RE | Admit: 2017-01-24 | Discharge: 2017-01-25 | Disposition: A | Discharge status: Home / Self Care | Payer: Medicare HMO | Source: Ambulatory Visit | Attending: Surgery | Admitting: Surgery

## 2017-01-24 DIAGNOSIS — K8010 Calculus of gallbladder with chronic cholecystitis without obstruction: Secondary | ICD-10-CM | POA: Diagnosis not present

## 2017-01-24 DIAGNOSIS — K8050 Calculus of bile duct without cholangitis or cholecystitis without obstruction: Secondary | ICD-10-CM | POA: Diagnosis present

## 2017-01-24 DIAGNOSIS — E669 Obesity, unspecified: Secondary | ICD-10-CM | POA: Diagnosis not present

## 2017-01-24 DIAGNOSIS — F200 Paranoid schizophrenia: Secondary | ICD-10-CM | POA: Diagnosis not present

## 2017-01-24 DIAGNOSIS — Z6832 Body mass index (BMI) 32.0-32.9, adult: Secondary | ICD-10-CM | POA: Diagnosis not present

## 2017-01-24 DIAGNOSIS — F79 Unspecified intellectual disabilities: Secondary | ICD-10-CM | POA: Insufficient documentation

## 2017-01-24 DIAGNOSIS — Z79899 Other long term (current) drug therapy: Secondary | ICD-10-CM | POA: Insufficient documentation

## 2017-01-24 DIAGNOSIS — Z419 Encounter for procedure for purposes other than remedying health state, unspecified: Secondary | ICD-10-CM

## 2017-01-24 DIAGNOSIS — E785 Hyperlipidemia, unspecified: Secondary | ICD-10-CM | POA: Diagnosis not present

## 2017-01-24 HISTORY — PX: CHOLECYSTECTOMY: SHX55

## 2017-01-24 LAB — CBC
HCT: 41.6 % (ref 35.0–47.0)
Hemoglobin: 14 g/dL (ref 12.0–16.0)
MCH: 29.8 pg (ref 26.0–34.0)
MCHC: 33.6 g/dL (ref 32.0–36.0)
MCV: 88.7 fL (ref 80.0–100.0)
PLATELETS: 217 10*3/uL (ref 150–440)
RBC: 4.69 MIL/uL (ref 3.80–5.20)
RDW: 15 % — AB (ref 11.5–14.5)
WBC: 13 10*3/uL — ABNORMAL HIGH (ref 3.6–11.0)

## 2017-01-24 LAB — CREATININE, SERUM
Creatinine, Ser: 1.09 mg/dL — ABNORMAL HIGH (ref 0.44–1.00)
GFR calc Af Amer: >60 mL/min (ref >60)
GFR calc non Af Amer: 55 mL/min — ABNORMAL LOW (ref >60)

## 2017-01-24 SURGERY — LAPAROSCOPIC CHOLECYSTECTOMY WITH INTRAOPERATIVE CHOLANGIOGRAM
Anesthesia: General | Wound class: Clean Contaminated

## 2017-01-24 MED ORDER — LABETALOL HCL 5 MG/ML IV SOLN
INTRAVENOUS | Status: DC | PRN
  Administered 2017-01-24 (×2): 5 mg via INTRAVENOUS

## 2017-01-24 MED ORDER — POLYVINYL ALCOHOL 1.4 % OP SOLN
1.0000 [drp] | Freq: Two times a day (BID) | OPHTHALMIC | Status: DC
  Administered 2017-01-24: 1 [drp] via OPHTHALMIC
  Filled 2017-01-24: qty 15

## 2017-01-24 MED ORDER — OXYCODONE HCL 5 MG/5ML PO SOLN: 5.0000 mg | Freq: Once | ORAL | Status: DC | PRN

## 2017-01-24 MED ORDER — BUPIVACAINE-EPINEPHRINE (PF) 0.25% -1:200000 IJ SOLN
INTRAMUSCULAR | Status: AC
  Filled 2017-01-24: qty 30

## 2017-01-24 MED ORDER — MEPERIDINE HCL 50 MG/ML IJ SOLN: 6.2500 mg | INTRAMUSCULAR | Status: DC | PRN

## 2017-01-24 MED ORDER — BUPIVACAINE-EPINEPHRINE (PF) 0.25% -1:200000 IJ SOLN
INTRAMUSCULAR | Status: DC | PRN
Start: 2017-01-24 — End: 2017-01-24
  Administered 2017-01-24: 30 mL via PERINEURAL

## 2017-01-24 MED ORDER — ESMOLOL HCL 100 MG/10ML IV SOLN
INTRAVENOUS | Status: DC | PRN
  Administered 2017-01-24: 40 mg via INTRAVENOUS

## 2017-01-24 MED ORDER — LIDOCAINE HCL (CARDIAC) 20 MG/ML IV SOLN
INTRAVENOUS | Status: DC | PRN
  Administered 2017-01-24: 100 mg via INTRAVENOUS

## 2017-01-24 MED ORDER — ROCURONIUM BROMIDE 100 MG/10ML IV SOLN
INTRAVENOUS | Status: DC | PRN
  Administered 2017-01-24: 5 mg via INTRAVENOUS

## 2017-01-24 MED ORDER — ROCURONIUM BROMIDE 50 MG/5ML IV SOLN
INTRAVENOUS | Status: AC
  Filled 2017-01-24: qty 1

## 2017-01-24 MED ORDER — FENTANYL CITRATE (PF) 100 MCG/2ML IJ SOLN
25.0000 ug | INTRAMUSCULAR | Status: DC | PRN
  Administered 2017-01-24: 25 ug via INTRAVENOUS

## 2017-01-24 MED ORDER — PROPOFOL 10 MG/ML IV BOLUS
INTRAVENOUS | Status: DC | PRN
  Administered 2017-01-24: 40 mg via INTRAVENOUS
  Administered 2017-01-24: 160 mg via INTRAVENOUS

## 2017-01-24 MED ORDER — ONDANSETRON HCL 4 MG/2ML IJ SOLN
INTRAMUSCULAR | Status: DC | PRN
  Administered 2017-01-24: 4 mg via INTRAVENOUS

## 2017-01-24 MED ORDER — SIMVASTATIN 40 MG PO TABS
40.0000 mg | ORAL_TABLET | Freq: Every day | ORAL | Status: DC
  Administered 2017-01-24: 40 mg via ORAL
  Filled 2017-01-24: qty 1

## 2017-01-24 MED ORDER — ACETAMINOPHEN 500 MG PO TABS
1000.0000 mg | ORAL_TABLET | ORAL | Status: AC
  Administered 2017-01-24: 1000 mg via ORAL

## 2017-01-24 MED ORDER — SUCCINYLCHOLINE CHLORIDE 20 MG/ML IJ SOLN
INTRAMUSCULAR | Status: DC | PRN
Start: 2017-01-24 — End: 2017-01-24
  Administered 2017-01-24: 100 mg via INTRAVENOUS

## 2017-01-24 MED ORDER — FENTANYL CITRATE (PF) 100 MCG/2ML IJ SOLN
INTRAMUSCULAR | Status: DC | PRN
  Administered 2017-01-24: 50 ug via INTRAVENOUS
  Administered 2017-01-24: 100 ug via INTRAVENOUS

## 2017-01-24 MED ORDER — FENTANYL CITRATE (PF) 100 MCG/2ML IJ SOLN
INTRAMUSCULAR | Status: AC
  Filled 2017-01-24: qty 2

## 2017-01-24 MED ORDER — LIDOCAINE HCL (PF) 2 % IJ SOLN
INTRAMUSCULAR | Status: AC
  Filled 2017-01-24: qty 2

## 2017-01-24 MED ORDER — LACTATED RINGERS IV SOLN
INTRAVENOUS | Status: DC
  Administered 2017-01-24: 08:00:00 via INTRAVENOUS

## 2017-01-24 MED ORDER — IOTHALAMATE MEGLUMINE 60 % INJ SOLN
INTRAMUSCULAR | Status: DC | PRN
  Administered 2017-01-24: 18 mL via SURGICAL_CAVITY

## 2017-01-24 MED ORDER — OXYCODONE HCL 5 MG PO TABS: 5.0000 mg | ORAL_TABLET | Freq: Once | ORAL | Status: DC | PRN

## 2017-01-24 MED ORDER — PROPOFOL 10 MG/ML IV BOLUS
INTRAVENOUS | Status: AC
  Filled 2017-01-24: qty 20

## 2017-01-24 MED ORDER — CHLORHEXIDINE GLUCONATE CLOTH 2 % EX PADS: 6.0000 | MEDICATED_PAD | Freq: Once | CUTANEOUS | Status: DC

## 2017-01-24 MED ORDER — MORPHINE SULFATE (PF) 4 MG/ML IV SOLN: 2.0000 mg | INTRAVENOUS | Status: DC | PRN

## 2017-01-24 MED ORDER — HEPARIN SODIUM (PORCINE) 5000 UNIT/ML IJ SOLN
5000.0000 [IU] | Freq: Once | INTRAMUSCULAR | Status: AC
  Administered 2017-01-24: 5000 [IU] via SUBCUTANEOUS

## 2017-01-24 MED ORDER — FAMOTIDINE 20 MG PO TABS
ORAL_TABLET | ORAL | Status: AC
  Administered 2017-01-24: 20 mg via ORAL
  Filled 2017-01-24: qty 1

## 2017-01-24 MED ORDER — ACETAMINOPHEN 500 MG PO TABS
ORAL_TABLET | ORAL | Status: AC
  Administered 2017-01-24: 1000 mg via ORAL
  Filled 2017-01-24: qty 2

## 2017-01-24 MED ORDER — THIOTHIXENE 5 MG PO CAPS
10.0000 mg | ORAL_CAPSULE | Freq: Two times a day (BID) | ORAL | Status: DC
  Administered 2017-01-24 – 2017-01-25 (×3): 10 mg via ORAL
  Filled 2017-01-24 (×3): qty 2

## 2017-01-24 MED ORDER — HEPARIN SODIUM (PORCINE) 5000 UNIT/ML IJ SOLN
INTRAMUSCULAR | Status: AC
  Administered 2017-01-24: 5000 [IU] via SUBCUTANEOUS
  Filled 2017-01-24: qty 1

## 2017-01-24 MED ORDER — DEXTROSE-NACL 5-0.9 % IV SOLN
INTRAVENOUS | Status: DC
  Administered 2017-01-24: 1000 mL via INTRAVENOUS
  Administered 2017-01-25: 01:00:00 via INTRAVENOUS

## 2017-01-24 MED ORDER — MIDAZOLAM HCL 2 MG/2ML IJ SOLN
INTRAMUSCULAR | Status: DC | PRN
  Administered 2017-01-24: 2 mg via INTRAVENOUS

## 2017-01-24 MED ORDER — SUGAMMADEX SODIUM 200 MG/2ML IV SOLN
INTRAVENOUS | Status: DC | PRN
Start: 2017-01-24 — End: 2017-01-24
  Administered 2017-01-24: 200 mg via INTRAVENOUS

## 2017-01-24 MED ORDER — MIDAZOLAM HCL 2 MG/2ML IJ SOLN
INTRAMUSCULAR | Status: AC
  Filled 2017-01-24: qty 2

## 2017-01-24 MED ORDER — CEFAZOLIN SODIUM-DEXTROSE 2-4 GM/100ML-% IV SOLN
INTRAVENOUS | Status: AC
  Administered 2017-01-24: 2 g via INTRAVENOUS
  Filled 2017-01-24: qty 100

## 2017-01-24 MED ORDER — BENZTROPINE MESYLATE 1 MG PO TABS
2.0000 mg | ORAL_TABLET | Freq: Every day | ORAL | Status: DC
  Administered 2017-01-24 – 2017-01-25 (×2): 2 mg via ORAL
  Filled 2017-01-24 (×2): qty 2

## 2017-01-24 MED ORDER — FAMOTIDINE 20 MG PO TABS
20.0000 mg | ORAL_TABLET | Freq: Once | ORAL | Status: AC
Start: 2017-01-24 — End: 2017-01-24
  Administered 2017-01-24: 20 mg via ORAL

## 2017-01-24 MED ORDER — PROMETHAZINE HCL 25 MG/ML IJ SOLN: 6.2500 mg | INTRAMUSCULAR | Status: DC | PRN

## 2017-01-24 MED ORDER — SUCCINYLCHOLINE CHLORIDE 20 MG/ML IJ SOLN
INTRAMUSCULAR | Status: AC
  Filled 2017-01-24: qty 1

## 2017-01-24 MED ORDER — HYDROCODONE-ACETAMINOPHEN 5-325 MG PO TABS
1.0000 | ORAL_TABLET | ORAL | Status: DC | PRN
  Administered 2017-01-24: 1 via ORAL
  Filled 2017-01-24: qty 1

## 2017-01-24 MED ORDER — HEPARIN SODIUM (PORCINE) 5000 UNIT/ML IJ SOLN
5000.0000 [IU] | Freq: Three times a day (TID) | INTRAMUSCULAR | Status: DC
  Administered 2017-01-24 – 2017-01-25 (×3): 5000 [IU] via SUBCUTANEOUS
  Filled 2017-01-24 (×4): qty 1

## 2017-01-24 MED ORDER — DEXAMETHASONE SODIUM PHOSPHATE 10 MG/ML IJ SOLN
INTRAMUSCULAR | Status: DC | PRN
  Administered 2017-01-24: 4 mg via INTRAVENOUS

## 2017-01-24 SURGICAL SUPPLY — 46 items
ADHESIVE MASTISOL STRL (MISCELLANEOUS) ×2 IMPLANT
APPLIER CLIP ROT 10 11.4 M/L (STAPLE) ×2
BLADE SURG SZ11 CARB STEEL (BLADE) ×2 IMPLANT
CANISTER SUCT 1200ML W/VALVE (MISCELLANEOUS) ×2 IMPLANT
CATH CHOLANGI 4FR 420404F (CATHETERS) IMPLANT
CHLORAPREP W/TINT 26ML (MISCELLANEOUS) ×2 IMPLANT
CLIP APPLIE ROT 10 11.4 M/L (STAPLE) ×1 IMPLANT
CONRAY 60ML FOR OR (MISCELLANEOUS) ×2 IMPLANT
DRAPE C-ARM XRAY 36X54 (DRAPES) IMPLANT
DRAPE SHEET LG 3/4 BI-LAMINATE (DRAPES) ×2 IMPLANT
ELECT REM PT RETURN 9FT ADLT (ELECTROSURGICAL) ×2
ELECTRODE REM PT RTRN 9FT ADLT (ELECTROSURGICAL) ×1 IMPLANT
ENDOPOUCH RETRIEVER 10 (MISCELLANEOUS) ×2 IMPLANT
GAUZE SPONGE NON-WVN 2X2 STRL (MISCELLANEOUS) ×4 IMPLANT
GLOVE BIO SURGEON STRL SZ 6.5 (GLOVE) ×4 IMPLANT
GLOVE BIO SURGEON STRL SZ8 (GLOVE) ×2 IMPLANT
GLOVE BIOGEL PI IND STRL 7.0 (GLOVE) ×3 IMPLANT
GLOVE BIOGEL PI INDICATOR 7.0 (GLOVE) ×3
GOWN STRL REUS W/ TWL LRG LVL3 (GOWN DISPOSABLE) ×3 IMPLANT
GOWN STRL REUS W/TWL LRG LVL3 (GOWN DISPOSABLE) ×3
IRRIGATION STRYKERFLOW (MISCELLANEOUS) ×1 IMPLANT
IRRIGATOR STRYKERFLOW (MISCELLANEOUS) ×2
IV CATH ANGIO 12GX3 LT BLUE (NEEDLE) ×2 IMPLANT
IV NS 1000ML (IV SOLUTION) ×1
IV NS 1000ML BAXH (IV SOLUTION) ×1 IMPLANT
JACKSON PRATT 10 (INSTRUMENTS) IMPLANT
KIT RM TURNOVER STRD PROC AR (KITS) ×2 IMPLANT
LABEL OR SOLS (LABEL) ×2 IMPLANT
NDL SAFETY 22GX1.5 (NEEDLE) ×2 IMPLANT
NEEDLE VERESS 14GA 120MM (NEEDLE) ×2 IMPLANT
NS IRRIG 500ML POUR BTL (IV SOLUTION) ×2 IMPLANT
PACK LAP CHOLECYSTECTOMY (MISCELLANEOUS) ×2 IMPLANT
SCISSORS METZENBAUM CVD 33 (INSTRUMENTS) ×2 IMPLANT
SLEEVE ENDOPATH XCEL 5M (ENDOMECHANICALS) ×4 IMPLANT
SPONGE LAP 18X18 5 PK (GAUZE/BANDAGES/DRESSINGS) ×2 IMPLANT
SPONGE VERSALON 2X2 STRL (MISCELLANEOUS) ×4
SPONGE VERSALON 4X4 4PLY (MISCELLANEOUS) IMPLANT
STRIP CLOSURE SKIN 1/2X4 (GAUZE/BANDAGES/DRESSINGS) ×2 IMPLANT
SUT MNCRL 4-0 (SUTURE) ×3
SUT MNCRL 4-0 27XMFL (SUTURE) ×3
SUT VICRYL 0 AB UR-6 (SUTURE) ×2 IMPLANT
SUTURE MNCRL 4-0 27XMF (SUTURE) ×3 IMPLANT
SYR 20CC LL (SYRINGE) ×2 IMPLANT
TROCAR XCEL NON-BLD 11X100MML (ENDOMECHANICALS) ×2 IMPLANT
TROCAR XCEL NON-BLD 5MMX100MML (ENDOMECHANICALS) ×2 IMPLANT
TUBING INSUFFLATOR HI FLOW (MISCELLANEOUS) ×2 IMPLANT

## 2017-01-24 NOTE — Progress Notes (Signed)
Spoke to patient caregiver Doreene Eland [phone] and will pick patient up once patient is discharged back to the group home.

## 2017-01-24 NOTE — Op Note (Signed)
Laparoscopic Cholecystectomy  Pre-operative Diagnosis: Biliary colic  Post-operative Diagnosis: Biliary colic  Procedure: Lap or scopic cholecystectomy with C-arm fluoroscopic angiography  Surgeon: Jerrol Banana. Burt Knack, MD FACS  Anesthesia: Gen. with endotracheal tube  Assistant: J student  Procedure Details  The patient was seen again in the Holding Room. The benefits, complications, treatment options, and expected outcomes were discussed with the patient. The risks of bleeding, infection, recurrence of symptoms, failure to resolve symptoms, bile duct damage, bile duct leak, retained common bile duct stone, bowel injury, any of which could require further surgery and/or ERCP, stent, or papillotomy were reviewed with the patient. The likelihood of improving the patient's symptoms with return to their baseline status is good.  The patient and/or family concurred with the proposed plan, giving informed consent.  The patient was taken to Operating Room, identified as Kylie Saunders and the procedure verified as Laparoscopic Cholecystectomy.  A Time Out was held and the above information confirmed.  Prior to the induction of general anesthesia, antibiotic prophylaxis was administered. VTE prophylaxis was in place. General endotracheal anesthesia was then administered and tolerated well. After the induction, the abdomen was prepped with Chloraprep and draped in the sterile fashion. The patient was positioned in the supine position.  Local anesthetic  was injected into the skin near the umbilicus and an incision made. The Veress needle was placed. Pneumoperitoneum was then created with CO2 and tolerated well without any adverse changes in the patient's vital signs. A 33mm port was placed in the periumbilical position and the abdominal cavity was explored. Some adhesions were encountered. Two 5-mm ports were placed in the right upper quadrant and a 12 mm epigastric port was placed all under direct vision.  All skin incisions  were infiltrated with a local anesthetic agent before making the incision and placing the trocars. The camera was placed in the epigastric port site to review back at the periumbilical site. Adhesions were noted there was no sign of bowel injury and no sign of bleeding in fact there was no bowel involved in the adhesions  The patient was positioned  in reverse Trendelenburg, tilted slightly to the patient's left.  The gallbladder was identified, the fundus grasped and retracted cephalad. Adhesions were lysed bluntly. The infundibulum was grasped and retracted laterally, exposing the peritoneum overlying the triangle of Calot. This was then divided and exposed in a blunt fashion. A critical view of the cystic duct and cystic artery was obtained.  The cystic duct was clearly identified and bluntly dissected.   The cystic duct was clipped and incised and through a separate incision and Angiocath a cholangiocatheter was placed. C-arm fluoroscopic angiography was performed which demonstrated good flow into the duodenum via a very dilated common bile duct. The cystic duct had been cannulated and the proximal ducts were not visualized but the proximal hepatic duct was visualized and filled easily. This was related due to the large volume necessary to fill the dilated duct. Intraluminal filling defects.  The cystic duct catheter was removed the cystic duct was doubly clipped and divided. The cystic artery was then doubly clipped and divided.  The gallbladder was taken from the gallbladder fossa in a retrograde fashion with the electrocautery. Multiple smaller branches of arterial structures were identified in the gallbladder fossa which were clipped or doubly clipped and divided. The gallbladder was removed and placed in an Endocatch bag. The liver bed was irrigated and inspected. Hemostasis was achieved with the electrocautery. Copious irrigation was utilized and was  repeatedly aspirated until  clear.  The gallbladder and Endocatch sac were then removed through the epigastric port site.   There was no sign of bleeding.  Inspection of the right upper quadrant was performed. No bleeding, bile duct injury or leak, or bowel injury was noted. Pneumoperitoneum was released.  The epigastric port site was closed with figure-of-eight 0 Vicryl sutures. 4-0 subcuticular Monocryl was used to close the skin. Steristrips and Mastisol and sterile dressings were  applied.  The patient was then extubated and brought to the recovery room in stable condition. Sponge, lap, and needle counts were correct at closure and at the conclusion of the case.   Findings: Chronic Cholecystitis with normal cholangiogram with the exception of the dilated duct without intraluminal filling defects and good flow into the duodenum.  Estimated Blood Loss: 25 cc         Drains: None         Specimens: Gallbladder           Complications: none               Kylie Saunders E. Burt Knack, MD, FACS

## 2017-01-24 NOTE — Anesthesia Preprocedure Evaluation (Signed)
Anesthesia Evaluation  Patient identified by MRN, date of birth, ID band Patient awake    Reviewed: Allergy & Precautions, NPO status , Patient's Chart, lab work & pertinent test results  History of Anesthesia Complications Negative for: history of anesthetic complications  Airway Mallampati: II  TM Distance: >3 FB Neck ROM: Full    Dental no notable dental hx.    Pulmonary neg pulmonary ROS, neg sleep apnea, neg COPD,    breath sounds clear to auscultation- rhonchi (-) wheezing      Cardiovascular Exercise Tolerance: Good hypertension, Pt. on medications (-) CAD and (-) Past MI  Rhythm:Regular Rate:Normal - Systolic murmurs and - Diastolic murmurs    Neuro/Psych PSYCHIATRIC DISORDERS Anxiety Schizophrenia negative neurological ROS     GI/Hepatic negative GI ROS, Neg liver ROS,   Endo/Other  negative endocrine ROSneg diabetes  Renal/GU negative Renal ROS     Musculoskeletal negative musculoskeletal ROS (+)   Abdominal (+) + obese,   Peds  Hematology negative hematology ROS (+)   Anesthesia Other Findings Past Medical History: No date: Acute pancreatitis No date: Anxiety 05/31/2012: Cancer of body of uterus ([place]) 07/25/2012: HLD (hyperlipidemia) No date: Hypertension 07/25/2012: Intellectual disability No date: Obesity No date: Pancreatitis due to biliary obstruction No date: Paranoid schizophrenia ([place]) 11/03/2016: Right eye injury   Reproductive/Obstetrics                             Anesthesia Physical Anesthesia Plan  ASA: II  Anesthesia Plan: General   Post-op Pain Management:    Induction: Intravenous  Airway Management Planned: Oral ETT  Additional Equipment:   Intra-op Plan:   Post-operative Plan: Extubation in OR  Informed Consent: I have reviewed the patients History and Physical, chart, labs and discussed the procedure including the risks, benefits and  alternatives for the proposed anesthesia with the patient or authorized representative who has indicated his/her understanding and acceptance.   Dental advisory given  Plan Discussed with: CRNA and Anesthesiologist  Anesthesia Plan Comments:         Anesthesia Quick Evaluation

## 2017-01-24 NOTE — Anesthesia Procedure Notes (Signed)
Procedure Name: Intubation Performed by: Lance Muss Pre-anesthesia Checklist: Patient identified, Patient being monitored, Timeout performed, Emergency Drugs available and Suction available Patient Re-evaluated:Patient Re-evaluated prior to inductionOxygen Delivery Method: Circle system utilized Preoxygenation: Pre-oxygenation with 100% oxygen Intubation Type: IV induction Ventilation: Mask ventilation without difficulty Laryngoscope Size: Mac and 3 Grade View: Grade II Tube type: Oral Tube size: 6.5 mm Number of attempts: 1 Airway Equipment and Method: Stylet and Bougie stylet Placement Confirmation: ETT inserted through vocal cords under direct vision,  positive ETCO2 and breath sounds checked- equal and bilateral Secured at: 21 cm Tube secured with: Tape Dental Injury: Teeth and Oropharynx as per pre-operative assessment  Difficulty Due To: Difficult Airway- due to anterior larynx Future Recommendations: Recommend- induction with short-acting agent, and alternative techniques readily available

## 2017-01-24 NOTE — Anesthesia Postprocedure Evaluation (Signed)
Anesthesia Post Note  Patient: Kylie Saunders  Procedure(s) Performed: Procedure(s) (LRB): LAPAROSCOPIC CHOLECYSTECTOMY WITH INTRAOPERATIVE CHOLANGIOGRAM (N/A)  Patient location during evaluation: PACU Anesthesia Type: General Level of consciousness: awake and alert and oriented Pain management: pain level controlled Vital Signs Assessment: post-procedure vital signs reviewed and stable Respiratory status: spontaneous breathing, nonlabored ventilation and respiratory function stable Cardiovascular status: blood pressure returned to baseline and stable Postop Assessment: no signs of nausea or vomiting Anesthetic complications: no     Last Vitals:  Vitals:   01/24/17 1043 01/24/17 1103  BP: (!) 160/94 (!) 145/81  Pulse: 85 86  Resp: 13   Temp: 36.9 C 36.7 C    Last Pain:  Vitals:   01/24/17 1103  TempSrc: Oral  PainSc:                  Franko Hilliker

## 2017-01-24 NOTE — Transfer of Care (Signed)
Immediate Anesthesia Transfer of Care Note  Patient: Kylie Saunders  Procedure(s) Performed: Procedure(s): LAPAROSCOPIC CHOLECYSTECTOMY WITH INTRAOPERATIVE CHOLANGIOGRAM (N/A)  Patient Location: PACU  Anesthesia Type:General  Level of Consciousness: sedated  Airway & Oxygen Therapy: Patient Spontanous Breathing and Patient connected to face mask oxygen  Post-op Assessment: Report given to RN and Post -op Vital signs reviewed and stable  Post vital signs: Reviewed and stable  Last Vitals:  Vitals:   01/24/17 0942 01/24/17 0945  BP: (!) 171/112 (!) 152/98  Pulse: 90 86  Resp: 18 13  Temp: 36.5 C     Last Pain:  Vitals:   01/24/17 0719  TempSrc: Oral  PainSc: 0-No pain      Patients Stated Pain Goal: 2 (44/92/01 0071)  Complications: No apparent anesthesia complications

## 2017-01-24 NOTE — Progress Notes (Signed)
Preoperative Review   Patient is met in the preoperative holding area. The history is reviewed in the chart and with the patient. I personally reviewed the options and rationale as well as the risks of this procedure that have been previously discussed with the patient. All questions asked by the patient and/or family were answered to their satisfaction. Also discussed with her caregiver. Patient agrees to proceed with this procedure at this time.  Florene Glen M.D. FACS

## 2017-01-24 NOTE — Anesthesia Post-op Follow-up Note (Cosign Needed)
Anesthesia QCDR form completed.        

## 2017-01-25 DIAGNOSIS — K8010 Calculus of gallbladder with chronic cholecystitis without obstruction: Secondary | ICD-10-CM | POA: Diagnosis not present

## 2017-01-25 LAB — CBC
HEMATOCRIT: 37.7 % (ref 35.0–47.0)
Hemoglobin: 12.5 g/dL (ref 12.0–16.0)
MCH: 30.1 pg (ref 26.0–34.0)
MCHC: 33.3 g/dL (ref 32.0–36.0)
MCV: 90.6 fL (ref 80.0–100.0)
PLATELETS: 208 10*3/uL (ref 150–440)
RBC: 4.16 MIL/uL (ref 3.80–5.20)
RDW: 15.4 % — AB (ref 11.5–14.5)
WBC: 8.6 10*3/uL (ref 3.6–11.0)

## 2017-01-25 LAB — COMPREHENSIVE METABOLIC PANEL
ALBUMIN: 3.6 g/dL (ref 3.5–5.0)
ALT: 27 U/L (ref 14–54)
AST: 26 U/L (ref 15–41)
Alkaline Phosphatase: 61 U/L (ref 38–126)
Anion gap: 7 (ref 5–15)
BILIRUBIN TOTAL: 0.3 mg/dL (ref 0.3–1.2)
BUN: 10 mg/dL (ref 6–20)
CHLORIDE: 103 mmol/L (ref 101–111)
CO2: 25 mmol/L (ref 22–32)
CREATININE: 0.87 mg/dL (ref 0.44–1.00)
Calcium: 9 mg/dL (ref 8.9–10.3)
GFR calc Af Amer: >60 mL/min (ref >60)
GLUCOSE: 136 mg/dL — AB (ref 65–99)
Potassium: 3.9 mmol/L (ref 3.5–5.1)
Sodium: 135 mmol/L (ref 135–145)
Total Protein: 7.1 g/dL (ref 6.5–8.1)

## 2017-01-25 LAB — SURGICAL PATHOLOGY

## 2017-01-25 LAB — HIV ANTIBODY (ROUTINE TESTING W REFLEX): HIV SCREEN 4TH GENERATION: NONREACTIVE

## 2017-01-25 LAB — LIPASE, BLOOD: Lipase: 18 U/L (ref 11–51)

## 2017-01-25 MED ORDER — HYDROCODONE-ACETAMINOPHEN 5-325 MG PO TABS: 1.0000 | ORAL_TABLET | ORAL | 0 refills | Status: DC | PRN

## 2017-01-25 NOTE — Care Management Obs Status (Signed)
[facility] NOTIFICATION   Patient Details  Name: Elainna Eshleman MRN: [id number] Date of Birth: 12-17-1960   Medicare Observation Status Notification Given:  No (Admitted observation less than 24 hours)    Beverly Sessions, RN 01/25/2017, 11:43 AM

## 2017-01-25 NOTE — Progress Notes (Signed)
1 Day Post-Op  Subjective: Status post laparoscopic cholecystectomy with cholangiography. Patient feels well is tolerating a diet and wants to go home.  Objective: Vital signs in last 24 hours: Temp:  [97.7 F (36.5 C)-98.9 F (37.2 C)] 98.9 F (37.2 C) (03/08 0555) Pulse Rate:  [69-90] 69 (03/08 0555) Resp:  [12-27] 20 (03/08 0555) BP: (134-171)/(81-112) 149/82 (03/08 0555) SpO2:  [92 %-98 %] 96 % (03/08 0812) FiO2 (%):  [28 %] 28 % (03/07 1652) Last BM Date:  (PTA)  Intake/Output from previous day: 03/07 0701 - 03/08 0700 In: 3300 [P.O.:1620; I.V.:1680] Out: 3415 [Urine:3400; Blood:15] Intake/Output this shift: Total I/O In: 271.7 [I.V.:271.7] Out: 0   Physical exam:  No icterus no jaundice abdomen is soft nontender wounds are dressed and clean calves are nontender  Lab Results: CBC   Recent Labs  01/24/17 1120 01/25/17 0414  WBC 13.0* 8.6  HGB 14.0 12.5  HCT 41.6 37.7  PLT 217 208   BMET  Recent Labs  01/24/17 1120 01/25/17 0414  NA  --  135  K  --  3.9  CL  --  103  CO2  --  25  GLUCOSE  --  136*  BUN  --  10  CREATININE 1.09* 0.87  CALCIUM  --  9.0   PT/INR No results for input(s): LABPROT, INR in the last 72 hours. ABG No results for input(s): PHART, HCO3 in the last 72 hours.  Invalid input(s): PCO2, PO2  Studies/Results: Dg Cholangiogram Operative  Result Date: 01/24/2017 CLINICAL DATA:  56 year old female with gallstone pancreatitis EXAM: INTRAOPERATIVE CHOLANGIOGRAM TECHNIQUE: Cholangiographic images from the C-arm fluoroscopic device were submitted for interpretation post-operatively. Please see the procedural report for the amount of contrast and the fluoroscopy time utilized. COMPARISON:  MRCP 12/19/2016 FINDINGS: A total of 46 saved images demonstrate cannulation of the cystic duct remnant and opacification of the biliary tree. There is moderate dilatation the common bile duct but no evidence of stenosis, stricture or  choledocholithiasis. Contrast material passes through the ampulla and into the duodenum. There is reflux of contrast material into the pancreatic duct. IMPRESSION: 1. Mild dilatation of the common bile duct without evidence of stenosis, stricture or choledocholithiasis. 2. Patent ampulla. 3. Reflux of contrast material into the main pancreatic duct. Electronically Signed   By: Jacqulynn Cadet M.D.   On: 01/24/2017 10:00    Anti-infectives: Anti-infectives    Start     Dose/Rate Route Frequency Ordered Stop   01/24/17 0600  ceFAZolin (ANCEF) IVPB 2g/100 mL premix     2 g 200 mL/hr over 30 Minutes Intravenous On call to O.R. 01/23/17 2309 01/24/17 0902      Assessment/Plan: s/p Procedure(s): LAPAROSCOPIC CHOLECYSTECTOMY WITH INTRAOPERATIVE CHOLANGIOGRAM   LFTs are reviewed and normal. Patient will be discharged today after breakfast and follow-up in 10 days she is given oral analgesics for pain and no other pain medications nor any changes to her medication list otherwise she will follow-up in 10 days or given*  Florene Glen, MD, FACS  01/25/2017

## 2017-01-25 NOTE — Discharge Instructions (Signed)
Remove dressing in 24 hours. °May shower in 24 hours. °Leave paper strips in place. °Resume all home medications. °Follow-up with Dr. Aki Abalos in 10 days. °

## 2017-01-25 NOTE — Progress Notes (Signed)
Pt d/c to back to group home today. IV removed intact.  Rx's given to pt w/all questions and concerns addressed.  D/C paperwork reviewed with caretaker and education provided with all questions and concerns addressed.  Infection Control Prevention discussed with patient and caretaker.  Pt partner at bedside for transport.

## 2017-02-02 ENCOUNTER — Encounter: Payer: Self-pay | Admitting: Surgery

## 2017-02-02 ENCOUNTER — Ambulatory Visit (INDEPENDENT_AMBULATORY_CARE_PROVIDER_SITE_OTHER): Payer: Medicare HMO | Admitting: Surgery

## 2017-02-02 VITALS — BP 139/81 | HR 87 | Temp 98.1°F | Ht 62.0 in | Wt 177.4 lb

## 2017-02-02 DIAGNOSIS — K851 Biliary acute pancreatitis without necrosis or infection: Secondary | ICD-10-CM

## 2017-02-02 NOTE — Patient Instructions (Signed)
Please call our office with any questions or concerns.  Please do not submerge in a tub, hot tub, or pool until incisions are completely sealed.  Use sun block to incision area over the next year if this area will be exposed to sun. This helps decrease scarring.  You may resume your normal activities on 03/07/17. At that time- Listen to your body when lifting, if you have pain when lifting, stop and then try again in a few days. Pain after doing exercises or activities of daily living is normal as you get back in to your normal routine.  If you develop redness, drainage, or pain at incision sites- call our office immediately and speak with a nurse.

## 2017-02-02 NOTE — Progress Notes (Signed)
Outpatient postop visit  02/02/2017  Kylie Saunders is an 56 y.o. female.    Procedure: Laparoscopic cholecystectomy with grams  CC: No complaints  HPI: This patient status post laparoscopic cholecystectomy with fluoroscopy and cholangiography for biliary pancreatitis.  Medications reviewed.    Physical Exam:  There were no vitals taken for this visit.    PE: Patient feels well and is back to her normal state abdomen is soft and nontender wounds are clean no erythema no drainage no icterus no jaundice    Assessment/Plan:  Pathology reviewed and benign cholangiogram report reviewed and reflected my readings in the operating room.  Patient doing very well recommend follow up on an as-needed basis  Florene Glen, MD, FACS

## 2017-05-09 ENCOUNTER — Ambulatory Visit: Payer: Medicare HMO | Admitting: Psychiatry

## 2017-05-16 ENCOUNTER — Ambulatory Visit: Payer: Medicare HMO | Admitting: Psychiatry

## 2017-05-16 ENCOUNTER — Encounter: Payer: Self-pay | Admitting: Psychiatry

## 2017-05-16 ENCOUNTER — Telehealth: Payer: Self-pay

## 2017-05-16 ENCOUNTER — Ambulatory Visit (INDEPENDENT_AMBULATORY_CARE_PROVIDER_SITE_OTHER): Payer: Medicare HMO | Admitting: Psychiatry

## 2017-05-16 VITALS — BP 125/82 | HR 90 | Temp 97.8°F | Wt 178.2 lb

## 2017-05-16 DIAGNOSIS — F79 Unspecified intellectual disabilities: Secondary | ICD-10-CM

## 2017-05-16 DIAGNOSIS — F200 Paranoid schizophrenia: Secondary | ICD-10-CM | POA: Diagnosis not present

## 2017-05-16 MED ORDER — THIOTHIXENE 10 MG PO CAPS: 10.0000 mg | ORAL_CAPSULE | Freq: Two times a day (BID) | ORAL | 6 refills | Status: DC

## 2017-05-16 MED ORDER — LORAZEPAM 0.5 MG PO TABS: 0.5000 mg | ORAL_TABLET | Freq: Three times a day (TID) | ORAL | 5 refills | Status: DC

## 2017-05-16 NOTE — Telephone Encounter (Signed)
faxed and confirmed rx ativan .5mg  take 1 tablet by mouth 3 times daily #90 with 5 refills by dr. Gretel Acre ID #[id number] order # [id number]

## 2017-05-16 NOTE — Progress Notes (Signed)
BH MD/PA/NP OP Progress Note  05/16/2017 11:22 AM Kylie Saunders  MRN:  [id number]  Subjective:    Pt is a 56 yo female with h/o Schizophrenia presented for follow up accompanied  by the staff at the group home. Patient appeared calm during the interview. She reported that she has been doing well and she is also attending the day program at least 3 days per week. The day program is through the [facility]. Staff reported that she is involved in activities and will also go out over the weekends. She does not have any acute issues at this time. She appeared calm and alert during the interview. She currently denied having any perceptual disturbances. She denied having any suicidal homicidal ideations or plans. Staff reported that she takes her medications as prescribed and does not have any side effects at this time.     Chief Complaint:  Chief Complaint    Follow-up; Medication Reaction     Visit Diagnosis:     ICD-10-CM   1. Paranoid schizophrenia, chronic condition ([place]) F20.0   2. Intellectual disability F48     Past Medical History:  Past Medical History:  Diagnosis Date  . Acute pancreatitis   . Anxiety   . Cancer of body of uterus ([place]) 05/31/2012  . HLD (hyperlipidemia) 07/25/2012  . Hypertension   . Intellectual disability 07/25/2012  . Obesity   . Pancreatitis due to biliary obstruction   . Paranoid schizophrenia ([place])   . Right eye injury 11/03/2016    Past Surgical History:  Procedure Laterality Date  . ABDOMINAL HYSTERECTOMY    . CHOLECYSTECTOMY N/A 01/24/2017   Procedure: LAPAROSCOPIC CHOLECYSTECTOMY WITH INTRAOPERATIVE CHOLANGIOGRAM;  Surgeon: Florene Glen, MD;  Location: [facility] ORS;  Service: General;  Laterality: N/A;  . EYE SURGERY    . TONSILLECTOMY     Family History:  Family History  Problem Relation Age of Onset  . Diabetes Neg Hx   . Hypertension Neg Hx    Social History:  Social History   Social History  . Marital status: Single    Spouse name: N/A  .  Number of children: N/A  . Years of education: N/A   Social History Main Topics  . Smoking status: Never Smoker  . Smokeless tobacco: Never Used  . Alcohol use No  . Drug use: No  . Sexual activity: Not Currently   Other Topics Concern  . None   Social History Narrative  . None   Additional History:  Lives at  [facility]  group home  Assessment:   Musculoskeletal: Strength & Muscle Tone: within normal limits Gait & Station: normal Patient leans: N/A  Psychiatric Specialty Exam: Medication Refill  Pertinent negatives include no congestion.    Review of Systems  Constitutional: Negative for malaise/fatigue.  HENT: Negative for congestion and tinnitus.   Eyes: Negative for pain.  Respiratory: Negative for hemoptysis.   Cardiovascular: Negative for claudication.  Gastrointestinal: Negative for diarrhea.  Genitourinary: Negative for frequency.  Musculoskeletal: Negative for back pain.  Neurological: Negative for speech change.  Endo/Heme/Allergies: Negative for environmental allergies.  Psychiatric/Behavioral: Negative for depression and hallucinations. The patient does not have insomnia.   All other systems reviewed and are negative.   Blood pressure 125/82, pulse 90, temperature 97.8 F (36.6 C), temperature source Oral, weight 178 lb 3.2 oz (80.8 kg).Body mass index is 32.59 kg/m.  General Appearance: Casual  Eye Contact:  Fair  Speech:  Slow  Volume:  Decreased  Mood:  Anxious  Affect:  Congruent  Thought Process:  Coherent and Goal Directed  Orientation:  Full (Time, Place, and Person)  Thought Content:  WDL  Suicidal Thoughts:  No  Homicidal Thoughts:  No  Memory:  Immediate;   Fair  Judgement:  Fair  Insight:  Fair and Shallow  Psychomotor Activity:  Decreased  Concentration:  Fair  Recall:  [employer] of Knowledge: Poor  Language: Fair  Akathisia:  No  Handed:  Right  AIMS (if indicated):  none  Assets:  Communication Skills Social Support   ADL's:  Intact  Cognition: WNL  Sleep:  7   Is the patient at risk to self?  No. Has the patient been a risk to self in the past 6 months?  No. Has the patient been a risk to self within the distant past?  No. Is the patient a risk to others?  No. Has the patient been a risk to others in the past 6 months?  No. Has the patient been a risk to others within the distant past?  No.  Current Medications: Current Outpatient Prescriptions  Medication Sig Dispense Refill  . amLODipine (NORVASC) 5 MG tablet Take 5 mg by mouth daily.    . benztropine (COGENTIN) 2 MG tablet Take 1 tablet (2 mg total) by mouth daily. 30 tablet 6  . LORazepam (ATIVAN) 0.5 MG tablet Take 1 tablet (0.5 mg total) by mouth 3 (three) times daily. 90 tablet 5  . Polyvinyl Alcohol-Povidone (REFRESH OP) Apply 1 application to eye 2 (two) times daily. Right eye only    . simvastatin (ZOCOR) 40 MG tablet Take 40 mg by mouth daily.     Marland Kitchen thiothixene (NAVANE) 10 MG capsule Take 1 capsule (10 mg total) by mouth 2 (two) times daily. 60 capsule 6   No current facility-administered medications for this visit.     Medical Decision Making:  Established Problem, Stable/Improving (1)  Treatment Plan Summary:Medication management   Discussed with the staff and the patient about her medications. Refilled her medications for the next 6 months. She was given a prescription of lorazepam with 5 refills.Will fax the refill to her pharmacy from this office.  Follow-up in 6 months   More than 50% of the time spent in psychoeducation, counseling and coordination of care.    This note was generated in part or whole with voice recognition software. Voice regonition is usually quite accurate but there are transcription errors that can and very often do occur. I apologize for any typographical errors that were not detected and corrected.    Rainey Pines, MD  05/16/2017, 11:22 AM

## 2017-06-04 ENCOUNTER — Other Ambulatory Visit: Payer: Self-pay | Admitting: Psychiatry

## 2017-06-04 ENCOUNTER — Telehealth: Payer: Self-pay

## 2017-06-04 MED ORDER — BENZTROPINE MESYLATE 2 MG PO TABS: 2.0000 mg | ORAL_TABLET | Freq: Every day | ORAL | 6 refills | Status: DC

## 2017-06-04 NOTE — Telephone Encounter (Signed)
done

## 2017-06-04 NOTE — Telephone Encounter (Signed)
Medication refill request - Fax received from [facility] for a new Cogentin order, last provided 11/08/16 + 6 refills. Pt. seen 05/16/17 with no medication changes but no new order provided at that time.

## 2017-08-01 ENCOUNTER — Telehealth: Payer: Self-pay

## 2017-08-01 NOTE — Telephone Encounter (Signed)
pharmacy called states that pt navane is not in supply anymore states that the manufacture is having problems geeting the raw materials needed to make this medication.  according to the information medication maybe ready in 2019. pharmacy would like to find out if there is something else that you wanted to try.

## 2017-09-17 ENCOUNTER — Encounter: Payer: Self-pay | Admitting: Psychiatry

## 2017-09-17 ENCOUNTER — Ambulatory Visit (INDEPENDENT_AMBULATORY_CARE_PROVIDER_SITE_OTHER): Payer: Medicare HMO | Admitting: Psychiatry

## 2017-09-17 VITALS — BP 131/84 | HR 89 | Temp 97.9°F | Wt 183.0 lb

## 2017-09-17 DIAGNOSIS — F79 Unspecified intellectual disabilities: Secondary | ICD-10-CM | POA: Diagnosis not present

## 2017-09-17 DIAGNOSIS — F200 Paranoid schizophrenia: Secondary | ICD-10-CM | POA: Diagnosis not present

## 2017-09-17 MED ORDER — LORAZEPAM 0.5 MG PO TABS: 0.5000 mg | ORAL_TABLET | Freq: Three times a day (TID) | ORAL | 5 refills | Status: DC

## 2017-09-17 MED ORDER — BENZTROPINE MESYLATE 2 MG PO TABS: 2.0000 mg | ORAL_TABLET | Freq: Every day | ORAL | 6 refills | Status: DC

## 2017-09-17 MED ORDER — THIOTHIXENE 10 MG PO CAPS: 10.0000 mg | ORAL_CAPSULE | Freq: Every day | ORAL | 0 refills | Status: DC

## 2017-09-17 MED ORDER — RISPERIDONE 2 MG PO TABS: ORAL_TABLET | ORAL | 2 refills | Status: DC

## 2017-09-17 NOTE — Progress Notes (Signed)
BH MD/PA/NP OP Progress Note  09/17/2017 1:38 PM Kylie Saunders  MRN:  [id number]  Subjective:    Pt is a 56 yo female with h/o Schizophrenia presented for follow up accompanied  by the staff at the group home. Patient was seen for evaluation as she is currently stable on her medication thiothixene but there is the patient a short age of the medication. Staff reported that she does not have any mood symptoms at this time. We discussed about her medications and she needs to be switched to Risperdal. I have received a fax from the pharmacy and they want her to be started on the Risperdal. Discussed with the staff and they reported that they have received 10 pills of the thiothixene from the pharmacy and they are running out of the medication. Discussed with him about starting her on thiothixene once daily and will cross-taper with Risperdal at this time. They agreed with the plan. Patient is excited about the holidays as she will be going home over the Thanksgiving break.   She denied having any suicidal homicidal ideations or plans.      Chief Complaint:  Chief Complaint    Follow-up; Medication Problem; Medication Refill     Visit Diagnosis:     ICD-10-CM   1. Paranoid schizophrenia, chronic condition ([place]) F20.0   2. Intellectual disability F67     Past Medical History:  Past Medical History:  Diagnosis Date  . Acute pancreatitis   . Anxiety   . Cancer of body of uterus ([place]) 05/31/2012  . HLD (hyperlipidemia) 07/25/2012  . Hypertension   . Intellectual disability 07/25/2012  . Obesity   . Pancreatitis due to biliary obstruction   . Paranoid schizophrenia ([place])   . Right eye injury 11/03/2016    Past Surgical History:  Procedure Laterality Date  . ABDOMINAL HYSTERECTOMY    . CHOLECYSTECTOMY N/A 01/24/2017   Procedure: LAPAROSCOPIC CHOLECYSTECTOMY WITH INTRAOPERATIVE CHOLANGIOGRAM;  Surgeon: Florene Glen, MD;  Location: [facility] ORS;  Service: General;  Laterality: N/A;  . EYE  SURGERY    . TONSILLECTOMY     Family History:  Family History  Problem Relation Age of Onset  . Diabetes Neg Hx   . Hypertension Neg Hx    Social History:  Social History   Social History  . Marital status: Single    Spouse name: N/A  . Number of children: N/A  . Years of education: N/A   Social History Main Topics  . Smoking status: Never Smoker  . Smokeless tobacco: Never Used  . Alcohol use No  . Drug use: No  . Sexual activity: Not Currently   Other Topics Concern  . None   Social History Narrative  . None   Additional History:  Lives at  [facility]  group home  Assessment:   Musculoskeletal: Strength & Muscle Tone: within normal limits Gait & Station: normal Patient leans: N/A  Psychiatric Specialty Exam: Medication Refill  Pertinent negatives include no congestion.    Review of Systems  Constitutional: Negative for malaise/fatigue.  HENT: Negative for congestion and tinnitus.   Eyes: Negative for pain.  Respiratory: Negative for hemoptysis.   Cardiovascular: Negative for claudication.  Gastrointestinal: Negative for diarrhea.  Genitourinary: Negative for frequency.  Musculoskeletal: Negative for back pain.  Neurological: Negative for speech change.  Endo/Heme/Allergies: Negative for environmental allergies.  Psychiatric/Behavioral: Negative for depression and hallucinations. The patient does not have insomnia.   All other systems reviewed and are negative.   Blood pressure  131/84, pulse 89, temperature 97.9 F (36.6 C), temperature source Oral, weight 183 lb (83 kg).Body mass index is 33.47 kg/m.  General Appearance: Casual  Eye Contact:  Fair  Speech:  Slow  Volume:  Decreased  Mood:  Anxious  Affect:  Congruent  Thought Process:  Coherent and Goal Directed  Orientation:  Full (Time, Place, and Person)  Thought Content:  WDL  Suicidal Thoughts:  No  Homicidal Thoughts:  No  Memory:  Immediate;   Fair  Judgement:  Fair  Insight:  Fair and  Shallow  Psychomotor Activity:  Decreased  Concentration:  Fair  Recall:  [employer] of Knowledge: Poor  Language: Fair  Akathisia:  No  Handed:  Right  AIMS (if indicated):  none  Assets:  Communication Skills Social Support  ADL's:  Intact  Cognition: WNL  Sleep:  7   Is the patient at risk to self?  No. Has the patient been a risk to self in the past 6 months?  No. Has the patient been a risk to self within the distant past?  No. Is the patient a risk to others?  No. Has the patient been a risk to others in the past 6 months?  No. Has the patient been a risk to others within the distant past?  No.  Current Medications: Current Outpatient Prescriptions  Medication Sig Dispense Refill  . amLODipine (NORVASC) 5 MG tablet Take 5 mg by mouth daily.    . benztropine (COGENTIN) 2 MG tablet Take 1 tablet (2 mg total) by mouth daily. 30 tablet 6  . LORazepam (ATIVAN) 0.5 MG tablet Take 1 tablet (0.5 mg total) by mouth 3 (three) times daily. 90 tablet 5  . Polyvinyl Alcohol-Povidone (REFRESH OP) Apply 1 application to eye 2 (two) times daily. Right eye only    . simvastatin (ZOCOR) 40 MG tablet Take 40 mg by mouth daily.     Marland Kitchen thiothixene (NAVANE) 10 MG capsule Take 1 capsule (10 mg total) by mouth daily. Take 1 pill in am - 10 days - then stop. Pt has supply 10 capsule 0  . risperiDONE (RISPERDAL) 2 MG tablet Take 1 pill po qhs - 10 days, Start 1 pill po BID- 09/26/17 60 tablet 2   No current facility-administered medications for this visit.     Medical Decision Making:  Established Problem, Stable/Improving (1)  Treatment Plan Summary:Medication management   Discussed with the staff and the patient about her medications. Change thiothixene 10 mg by mouth daily x 10 days. Start Risperdal 2 mg daily at bedtime x 10 days. Increase Risperdal 2 mg by mouth twice a day as she will stop the thiothixene in 10 days and she agreed with the plan. Refilled her medications . She was given a  prescription of lorazepam with 5 refills Follow-up in 1 months   More than 50% of the time spent in psychoeducation, counseling and coordination of care.    This note was generated in part or whole with voice recognition software. Voice regonition is usually quite accurate but there are transcription errors that can and very often do occur. I apologize for any typographical errors that were not detected and corrected.    Rainey Pines, MD  09/17/2017, 1:38 PM

## 2017-10-15 ENCOUNTER — Telehealth: Payer: Self-pay

## 2017-10-15 ENCOUNTER — Other Ambulatory Visit: Payer: Self-pay | Admitting: Psychiatry

## 2017-10-15 ENCOUNTER — Ambulatory Visit: Payer: Medicare HMO | Admitting: Psychiatry

## 2017-10-15 DIAGNOSIS — F200 Paranoid schizophrenia: Secondary | ICD-10-CM

## 2017-10-15 MED ORDER — RISPERIDONE 2 MG PO TABS: 2.0000 mg | ORAL_TABLET | Freq: Two times a day (BID) | ORAL | 2 refills | Status: DC

## 2017-10-15 NOTE — Telephone Encounter (Signed)
sent 

## 2017-10-15 NOTE — Telephone Encounter (Signed)
Risperidone refill sent to patient's pharmacy. Patient was given lorazepam prescription with 5 refills during her last visit with Dr.Faheem.  Will not refill it.

## 2017-10-15 NOTE — Telephone Encounter (Signed)
pt was suppose to be seen today but dr. Gretel Acre left sick.  pt needs enough medicaiton  to do until her next appt. Marland Kitchen

## 2017-11-05 ENCOUNTER — Ambulatory Visit: Payer: Medicare HMO | Admitting: Psychiatry

## 2017-12-17 ENCOUNTER — Ambulatory Visit: Payer: Medicare HMO | Admitting: Psychiatry

## 2017-12-24 ENCOUNTER — Ambulatory Visit (INDEPENDENT_AMBULATORY_CARE_PROVIDER_SITE_OTHER): Payer: Medicare HMO | Admitting: Psychiatry

## 2017-12-24 ENCOUNTER — Encounter: Payer: Self-pay | Admitting: Psychiatry

## 2017-12-24 DIAGNOSIS — F200 Paranoid schizophrenia: Secondary | ICD-10-CM | POA: Diagnosis not present

## 2017-12-24 DIAGNOSIS — F79 Unspecified intellectual disabilities: Secondary | ICD-10-CM | POA: Diagnosis not present

## 2017-12-24 MED ORDER — RISPERIDONE 2 MG PO TABS: 2.0000 mg | ORAL_TABLET | Freq: Two times a day (BID) | ORAL | 5 refills | Status: DC

## 2017-12-24 MED ORDER — BENZTROPINE MESYLATE 2 MG PO TABS: 2.0000 mg | ORAL_TABLET | Freq: Every day | ORAL | 6 refills | Status: DC

## 2017-12-24 MED ORDER — LORAZEPAM 0.5 MG PO TABS: 0.5000 mg | ORAL_TABLET | Freq: Three times a day (TID) | ORAL | 5 refills | Status: DC

## 2017-12-24 NOTE — Progress Notes (Signed)
BH MD/PA/NP OP Progress Note  12/24/2017 2:30 PM Kylie Saunders  MRN:  [id number]  Subjective:    Pt is a 57 yo female with h/o Schizophrenia presented for follow up accompanied  by the staff at the group home. Patient has been having upper respiratory infection and she appeared sig. Staff reported that she is just coming from her primary care physician office who has started her on the medication and antibiotics. Patient is currently stable on Risperdal which was started after she was switched from [place]. She is doing well and is responding to the medication. She does not have any side effects. Staff is monitoring her closely. She is compliant with her medications. No acute symptoms noted at this time. She currently denied having any perceptual disturbances. She denied having any suicidal homicidal ideations or plans.       Chief Complaint:   Visit Diagnosis:     ICD-10-CM   1. Paranoid schizophrenia, chronic condition ([place]) F20.0   2. Intellectual disability F79   3. Schizophrenia, paranoid ([place]) F20.0 risperiDONE (RISPERDAL) 2 MG tablet    Past Medical History:  Past Medical History:  Diagnosis Date  . Acute pancreatitis   . Anxiety   . Cancer of body of uterus ([place]) 05/31/2012  . HLD (hyperlipidemia) 07/25/2012  . Hypertension   . Intellectual disability 07/25/2012  . Obesity   . Pancreatitis due to biliary obstruction   . Paranoid schizophrenia ([place])   . Right eye injury 11/03/2016    Past Surgical History:  Procedure Laterality Date  . ABDOMINAL HYSTERECTOMY    . CHOLECYSTECTOMY N/A 01/24/2017   Procedure: LAPAROSCOPIC CHOLECYSTECTOMY WITH INTRAOPERATIVE CHOLANGIOGRAM;  Surgeon: Florene Glen, MD;  Location: [facility] ORS;  Service: General;  Laterality: N/A;  . EYE SURGERY    . TONSILLECTOMY     Family History:  Family History  Problem Relation Age of Onset  . Diabetes Neg Hx   . Hypertension Neg Hx    Social History:  Social History   Socioeconomic History  .  Marital status: Single    Spouse name: Not on file  . Number of children: Not on file  . Years of education: Not on file  . Highest education level: Not on file  Social Needs  . Financial resource strain: Not on file  . Food insecurity - worry: Not on file  . Food insecurity - inability: Not on file  . Transportation needs - medical: Not on file  . Transportation needs - non-medical: Not on file  Occupational History  . Not on file  Tobacco Use  . Smoking status: Never Smoker  . Smokeless tobacco: Never Used  Substance and Sexual Activity  . Alcohol use: No    Alcohol/week: 0.0 oz  . Drug use: No  . Sexual activity: Not Currently  Other Topics Concern  . Not on file  Social History Narrative  . Not on file   Additional History:  Lives at  [facility]  group home  Assessment:   Musculoskeletal: Strength & Muscle Tone: within normal limits Gait & Station: normal Patient leans: N/A  Psychiatric Specialty Exam: Medication Refill  Pertinent negatives include no congestion.    Review of Systems  Constitutional: Negative for malaise/fatigue.  HENT: Negative for congestion and tinnitus.   Eyes: Negative for pain.  Respiratory: Negative for hemoptysis.   Cardiovascular: Negative for claudication.  Gastrointestinal: Negative for diarrhea.  Genitourinary: Negative for frequency.  Musculoskeletal: Negative for back pain.  Neurological: Negative for speech change.  Endo/Heme/Allergies: Negative for environmental allergies.  Psychiatric/Behavioral: Negative for depression and hallucinations. The patient does not have insomnia.   All other systems reviewed and are negative.   There were no vitals taken for this visit.There is no height or weight on file to calculate BMI.  General Appearance: Casual  Eye Contact:  Fair  Speech:  Slow  Volume:  Decreased  Mood:  Anxious  Affect:  Congruent  Thought Process:  Coherent and Goal Directed  Orientation:  Full (Time, Place, and  Person)  Thought Content:  WDL  Suicidal Thoughts:  No  Homicidal Thoughts:  No  Memory:  Immediate;   Fair  Judgement:  Fair  Insight:  Fair and Shallow  Psychomotor Activity:  Decreased  Concentration:  Fair  Recall:  [employer] of Knowledge: Poor  Language: Fair  Akathisia:  No  Handed:  Right  AIMS (if indicated):  none  Assets:  Communication Skills Social Support  ADL's:  Intact  Cognition: WNL  Sleep:  7   Is the patient at risk to self?  No. Has the patient been a risk to self in the past 6 months?  No. Has the patient been a risk to self within the distant past?  No. Is the patient a risk to others?  No. Has the patient been a risk to others in the past 6 months?  No. Has the patient been a risk to others within the distant past?  No.  Current Medications: Current Outpatient Medications  Medication Sig Dispense Refill  . amLODipine (NORVASC) 5 MG tablet Take 5 mg by mouth daily.    . benztropine (COGENTIN) 2 MG tablet Take 1 tablet (2 mg total) by mouth daily. 30 tablet 6  . LORazepam (ATIVAN) 0.5 MG tablet Take 1 tablet (0.5 mg total) by mouth 3 (three) times daily. 90 tablet 5  . Polyvinyl Alcohol-Povidone (REFRESH OP) Apply 1 application to eye 2 (two) times daily. Right eye only    . risperiDONE (RISPERDAL) 2 MG tablet Take 1 tablet (2 mg total) by mouth 2 (two) times daily. 60 tablet 5  . simvastatin (ZOCOR) 40 MG tablet Take 40 mg by mouth daily.      No current facility-administered medications for this visit.     Medical Decision Making:  Established Problem, Stable/Improving (1)  Treatment Plan Summary:Medication management   Continued Risperdal 2 mg by mouth twice a day  Refilled her medications . She was given a prescription of lorazepam with 5 refills Follow-up in 6 months   More than 50% of the time spent in psychoeducation, counseling and coordination of care.    This note was generated in part or whole with voice recognition software.  Voice regonition is usually quite accurate but there are transcription errors that can and very often do occur. I apologize for any typographical errors that were not detected and corrected.    Rainey Pines, MD  12/24/2017, 2:30 PM

## 2018-06-24 ENCOUNTER — Ambulatory Visit (INDEPENDENT_AMBULATORY_CARE_PROVIDER_SITE_OTHER): Payer: Medicare HMO | Admitting: Psychiatry

## 2018-06-24 ENCOUNTER — Other Ambulatory Visit: Payer: Self-pay

## 2018-06-24 ENCOUNTER — Encounter: Payer: Self-pay | Admitting: Psychiatry

## 2018-06-24 VITALS — BP 97/63 | HR 105 | Temp 98.7°F | Wt 199.6 lb

## 2018-06-24 DIAGNOSIS — F200 Paranoid schizophrenia: Secondary | ICD-10-CM

## 2018-06-24 DIAGNOSIS — F79 Unspecified intellectual disabilities: Secondary | ICD-10-CM | POA: Diagnosis not present

## 2018-06-24 MED ORDER — BENZTROPINE MESYLATE 2 MG PO TABS: 2.0000 mg | ORAL_TABLET | Freq: Every day | ORAL | 6 refills | Status: DC

## 2018-06-24 MED ORDER — RISPERIDONE 2 MG PO TABS: 2.0000 mg | ORAL_TABLET | Freq: Two times a day (BID) | ORAL | 5 refills | Status: DC

## 2018-06-24 MED ORDER — LORAZEPAM 0.5 MG PO TABS: 0.5000 mg | ORAL_TABLET | Freq: Two times a day (BID) | ORAL | 5 refills | Status: DC

## 2018-06-24 NOTE — Progress Notes (Signed)
BH MD/PA/NP OP Progress Note  06/24/2018 1:16 PM Kylie Saunders  MRN:  [id number]  Subjective:    Pt is a 57 yo female with h/o Schizophrenia presented for follow up accompanied  by the staff at the group home.  Staff reported that patient is doing well and was recently started on the blood pressure medications and is currently taking 2 pills.  Her blood pressure appeared low at this time.  She appears calm and alert and has been compliant on her medications.  She does not have any acute issues.  Her symptoms are stable.  She denied having any perceptual disturbances.  She denied having any suicidal homicidal ideations or plans.  Discussed about adjusting her medications and they agreed with the plan.    She has recently received a packet of lorazepam 0.5 mg 3 times daily.  We will decrease the dose to twice daily and staff agreed with the plan.    She gets along well with the patient's at the home.  No acute issues noted at this time.      Chief Complaint:  Chief Complaint    Follow-up; Medication Refill     Visit Diagnosis:     ICD-10-CM   1. Paranoid schizophrenia, chronic condition ([place]) F20.0   2. Intellectual disability F37     Past Medical History:  Past Medical History:  Diagnosis Date  . Acute pancreatitis   . Anxiety   . Cancer of body of uterus ([place]) 05/31/2012  . HLD (hyperlipidemia) 07/25/2012  . Hypertension   . Intellectual disability 07/25/2012  . Obesity   . Pancreatitis due to biliary obstruction   . Paranoid schizophrenia ([place])   . Right eye injury 11/03/2016    Past Surgical History:  Procedure Laterality Date  . ABDOMINAL HYSTERECTOMY    . CHOLECYSTECTOMY N/A 01/24/2017   Procedure: LAPAROSCOPIC CHOLECYSTECTOMY WITH INTRAOPERATIVE CHOLANGIOGRAM;  Surgeon: Florene Glen, MD;  Location: [facility] ORS;  Service: General;  Laterality: N/A;  . EYE SURGERY    . TONSILLECTOMY     Family History:  Family History  Problem Relation Age of Onset  . Diabetes Neg Hx    . Hypertension Neg Hx    Social History:  Social History   Socioeconomic History  . Marital status: Single    Spouse name: Not on file  . Number of children: Not on file  . Years of education: Not on file  . Highest education level: Not on file  Occupational History  . Not on file  Social Needs  . Financial resource strain: Not on file  . Food insecurity:    Worry: Not on file    Inability: Not on file  . Transportation needs:    Medical: Not on file    Non-medical: Not on file  Tobacco Use  . Smoking status: Never Smoker  . Smokeless tobacco: Never Used  Substance and Sexual Activity  . Alcohol use: No    Alcohol/week: 0.0 oz  . Drug use: No  . Sexual activity: Not Currently  Lifestyle  . Physical activity:    Days per week: Not on file    Minutes per session: Not on file  . Stress: Not on file  Relationships  . Social connections:    Talks on phone: Not on file    Gets together: Not on file    Attends religious service: Not on file    Active member of club or organization: Not on file    Attends meetings of  clubs or organizations: Not on file    Relationship status: Not on file  Other Topics Concern  . Not on file  Social History Narrative  . Not on file   Additional History:  Lives at  [facility]  group home  Assessment:   Musculoskeletal: Strength & Muscle Tone: within normal limits Gait & Station: normal Patient leans: N/A  Psychiatric Specialty Exam: Medication Refill  Pertinent negatives include no congestion.    Review of Systems  Constitutional: Negative for malaise/fatigue.  HENT: Negative for congestion and tinnitus.   Eyes: Negative for pain.  Respiratory: Negative for hemoptysis.   Cardiovascular: Negative for claudication.  Gastrointestinal: Negative for diarrhea.  Genitourinary: Negative for frequency.  Musculoskeletal: Negative for back pain.  Neurological: Negative for speech change.  Endo/Heme/Allergies: Negative for  environmental allergies.  Psychiatric/Behavioral: Negative for depression and hallucinations. The patient does not have insomnia.   All other systems reviewed and are negative.   Blood pressure 97/63, pulse (!) 105, temperature 98.7 F (37.1 C), temperature source Oral, weight 199 lb 9.6 oz (90.5 kg).Body mass index is 36.51 kg/m.  General Appearance: Casual  Eye Contact:  Fair  Speech:  Slow  Volume:  Decreased  Mood:  Anxious  Affect:  Congruent  Thought Process:  Coherent and Goal Directed  Orientation:  Full (Time, Place, and Person)  Thought Content:  WDL  Suicidal Thoughts:  No  Homicidal Thoughts:  No  Memory:  Immediate;   Fair  Judgement:  Fair  Insight:  Fair and Shallow  Psychomotor Activity:  Decreased  Concentration:  Fair  Recall:  [employer] of Knowledge: Poor  Language: Fair  Akathisia:  No  Handed:  Right  AIMS (if indicated):  none  Assets:  Communication Skills Social Support  ADL's:  Intact  Cognition: WNL  Sleep:  7   Is the patient at risk to self?  No. Has the patient been a risk to self in the past 6 months?  No. Has the patient been a risk to self within the distant past?  No. Is the patient a risk to others?  No. Has the patient been a risk to others in the past 6 months?  No. Has the patient been a risk to others within the distant past?  No.  Current Medications: Current Outpatient Medications  Medication Sig Dispense Refill  . amLODipine (NORVASC) 5 MG tablet Take 5 mg by mouth daily.    . benztropine (COGENTIN) 2 MG tablet Take 1 tablet (2 mg total) by mouth daily. 30 tablet 6  . fluticasone (FLONASE) 50 MCG/ACT nasal spray Place into both nostrils daily.    Marland Kitchen lisinopril-hydrochlorothiazide (PRINZIDE,ZESTORETIC) 20-12.5 MG tablet Take 0.5 tablets by mouth daily.    Marland Kitchen loratadine (CLARITIN) 10 MG tablet Take 10 mg by mouth daily.    Marland Kitchen LORazepam (ATIVAN) 0.5 MG tablet Take 1 tablet (0.5 mg total) by mouth 3 (three) times daily. 90 tablet 5   . Polyvinyl Alcohol-Povidone (REFRESH OP) Apply 1 application to eye 2 (two) times daily. Right eye only    . risperiDONE (RISPERDAL) 2 MG tablet Take 1 tablet (2 mg total) by mouth 2 (two) times daily. 60 tablet 5  . simvastatin (ZOCOR) 40 MG tablet Take 40 mg by mouth daily.     Marland Kitchen White Petrolatum-Mineral Oil (EYE LUBRICANT) OINT Administer 1 application to the right eye Two (2) times a day.     No current facility-administered medications for this visit.     Medical  Decision Making:  Established Problem, Stable/Improving (1)  Treatment Plan Summary:Medication management   Continued Risperdal 2 mg by mouth twice a day And Cogentin 2 mg at bedtime.  I will decrease the dose of lorazepam 0.5 mg p.o. twice daily.  She was given a prescription of lorazepam with 5 refills Follow-up in 6 months   More than 50% of the time spent in psychoeducation, counseling and coordination of care.    This note was generated in part or whole with voice recognition software. Voice regonition is usually quite accurate but there are transcription errors that can and very often do occur. I apologize for any typographical errors that were not detected and corrected.    Rainey Pines, MD  06/24/2018, 1:16 PM

## 2018-07-07 ENCOUNTER — Emergency Department
Admission: EM | Admit: 2018-07-07 | Discharge: 2018-07-08 | Disposition: A | Discharge status: Home / Self Care | Payer: Medicare HMO | Attending: Emergency Medicine | Admitting: Emergency Medicine

## 2018-07-07 ENCOUNTER — Other Ambulatory Visit: Payer: Self-pay

## 2018-07-07 ENCOUNTER — Emergency Department: Payer: Medicare HMO

## 2018-07-07 DIAGNOSIS — R079 Chest pain, unspecified: Secondary | ICD-10-CM | POA: Diagnosis present

## 2018-07-07 DIAGNOSIS — R0789 Other chest pain: Secondary | ICD-10-CM | POA: Diagnosis not present

## 2018-07-07 DIAGNOSIS — Z79899 Other long term (current) drug therapy: Secondary | ICD-10-CM | POA: Diagnosis not present

## 2018-07-07 DIAGNOSIS — Z8542 Personal history of malignant neoplasm of other parts of uterus: Secondary | ICD-10-CM | POA: Insufficient documentation

## 2018-07-07 DIAGNOSIS — I10 Essential (primary) hypertension: Secondary | ICD-10-CM | POA: Insufficient documentation

## 2018-07-07 LAB — COMPREHENSIVE METABOLIC PANEL
ALK PHOS: 68 U/L (ref 38–126)
ALT: 15 U/L (ref 0–44)
AST: 14 U/L — ABNORMAL LOW (ref 15–41)
Albumin: 3.5 g/dL (ref 3.5–5.0)
Anion gap: 9 (ref 5–15)
BUN: 16 mg/dL (ref 6–20)
CALCIUM: 8.6 mg/dL — AB (ref 8.9–10.3)
CO2: 28 mmol/L (ref 22–32)
CREATININE: 0.93 mg/dL (ref 0.44–1.00)
Chloride: 102 mmol/L (ref 98–111)
GFR calc Af Amer: >60 mL/min (ref >60)
Glucose, Bld: 105 mg/dL — ABNORMAL HIGH (ref 70–99)
Potassium: 3.7 mmol/L (ref 3.5–5.1)
Sodium: 139 mmol/L (ref 135–145)
Total Bilirubin: 0.4 mg/dL (ref 0.3–1.2)
Total Protein: 7.1 g/dL (ref 6.5–8.1)

## 2018-07-07 LAB — CBC WITH DIFFERENTIAL/PLATELET
Basophils Absolute: 0.1 K/uL (ref 0–0.1)
Basophils Relative: 1 %
Eosinophils Absolute: 0.3 K/uL (ref 0–0.7)
Eosinophils Relative: 3 %
HCT: 38.2 % (ref 35.0–47.0)
Hemoglobin: 13.2 g/dL (ref 12.0–16.0)
Lymphocytes Relative: 19 %
Lymphs Abs: 1.7 K/uL (ref 1.0–3.6)
MCH: 30.9 pg (ref 26.0–34.0)
MCHC: 34.4 g/dL (ref 32.0–36.0)
MCV: 89.7 fL (ref 80.0–100.0)
Monocytes Absolute: 0.8 K/uL (ref 0.2–0.9)
Monocytes Relative: 9 %
Neutro Abs: 5.8 K/uL (ref 1.4–6.5)
Neutrophils Relative %: 68 %
Platelets: 230 K/uL (ref 150–440)
RBC: 4.26 MIL/uL (ref 3.80–5.20)
RDW: 14.8 % — ABNORMAL HIGH (ref 11.5–14.5)
WBC: 8.7 K/uL (ref 3.6–11.0)

## 2018-07-07 LAB — TROPONIN I: Troponin I: <0.03 ng/mL

## 2018-07-07 LAB — LIPASE, BLOOD: LIPASE: 35 U/L (ref 11–51)

## 2018-07-07 MED ORDER — ACETAMINOPHEN 500 MG PO TABS
1000.0000 mg | ORAL_TABLET | Freq: Once | ORAL | Status: AC
  Administered 2018-07-08: 1000 mg via ORAL
  Filled 2018-07-07: qty 2

## 2018-07-07 NOTE — ED Provider Notes (Signed)
[facility] Emergency Department Provider Note  ____________________________________________   First MD Initiated Contact with Patient 07/07/18 2123     (approximate)  I have reviewed the triage vital signs and the nursing notes.   HISTORY  Chief Complaint Chest Pain  Level 5 caveat: History is limited by the patient's disability due to chronic schizophrenia.  She lives at a group home and has a legal guardian.  HPI Kylie Saunders is a 57 y.o. female with documented intellectual disability and paranoid schizophrenia who presents for evaluation of anterior chest pain for 2 weeks.  She cannot describe exactly how it feels except it may be dull.  Nothing in particular makes it better or worse.  She denies shortness of breath, nausea, vomiting, and abdominal pain.  She has been eating and drinking normally lately.  Eating does not make it any worse.  She denies fever and chills.  She did not tell anyone because she did not want to have to come to the hospital.  She told her mother tonight who called EMS.  She does not appear to be in any distress at this time.  Past Medical History:  Diagnosis Date  . Acute pancreatitis   . Anxiety   . Cancer of body of uterus ([place]) 05/31/2012  . HLD (hyperlipidemia) 07/25/2012  . Hypertension   . Intellectual disability 07/25/2012  . Obesity   . Pancreatitis due to biliary obstruction   . Paranoid schizophrenia ([place])   . Right eye injury 11/03/2016    Patient Active Problem List   Diagnosis Date Noted  . Biliary colic 38/88/2800  . Ruptured globe of right eye 12/22/2016  . Left upper quadrant pain   . Acute pancreatitis   . Abdominal pain 12/16/2016  . Paranoid schizophrenia ([place]) 11/03/2016  . Right eye injury 11/03/2016  . HLD (hyperlipidemia) 07/25/2012  . Intellectual disability 07/25/2012  . Cancer of body of uterus ([place]) 05/31/2012  . Malignant neoplasm of body of uterus ([place]) 05/31/2012    Past Surgical  History:  Procedure Laterality Date  . ABDOMINAL HYSTERECTOMY    . CHOLECYSTECTOMY N/A 01/24/2017   Procedure: LAPAROSCOPIC CHOLECYSTECTOMY WITH INTRAOPERATIVE CHOLANGIOGRAM;  Surgeon: Florene Glen, MD;  Location: [facility] ORS;  Service: General;  Laterality: N/A;  . EYE SURGERY    . TONSILLECTOMY      Prior to Admission medications   Medication Sig Start Date End Date Taking? Authorizing Provider  amLODipine (NORVASC) 5 MG tablet Take 5 mg by mouth daily.    [provider]  benztropine (COGENTIN) 2 MG tablet Take 1 tablet (2 mg total) by mouth daily. 06/24/18   Rainey Pines, MD  fluticasone (FLONASE) 50 MCG/ACT nasal spray Place into both nostrils daily.    [provider]  lisinopril-hydrochlorothiazide (PRINZIDE,ZESTORETIC) 20-12.5 MG tablet Take 0.5 tablets by mouth daily.    [provider]  loratadine (CLARITIN) 10 MG tablet Take 10 mg by mouth daily.    [provider]  LORazepam (ATIVAN) 0.5 MG tablet Take 1 tablet (0.5 mg total) by mouth 2 (two) times daily. 06/24/18   Rainey Pines, MD  Polyvinyl Alcohol-Povidone (REFRESH OP) Apply 1 application to eye 2 (two) times daily. Right eye only    [provider]  risperiDONE (RISPERDAL) 2 MG tablet Take 1 tablet (2 mg total) by mouth 2 (two) times daily. 06/24/18   Rainey Pines, MD  simvastatin (ZOCOR) 40 MG tablet Take 40 mg by mouth daily.  05/31/12   [provider]  White Petrolatum-Mineral Oil (EYE LUBRICANT) OINT Administer 1 application to the right eye Two (2) times a day. 11/04/16   [provider]    Allergies Patient has no known allergies.  Family History  Problem Relation Age of Onset  . Diabetes Neg Hx   . Hypertension Neg Hx     Social History Social History   Tobacco Use  . Smoking status: Never Smoker  . Smokeless tobacco: Never Used  Substance Use Topics  . Alcohol use: No    Alcohol/week: 0.0 standard drinks  . Drug use: No    Review of  Systems Level 5 caveat: History is limited by the patient's disability due to chronic schizophrenia.  She lives at a group home and has a legal guardian.  Constitutional: No fever/chills Eyes: No visual changes. Chronic right eye problem. ENT: No sore throat. Cardiovascular: CP as described above Respiratory: Denies shortness of breath. Gastrointestinal: No abdominal pain.  No nausea, no vomiting.  No diarrhea.  No constipation. Genitourinary: Negative for dysuria. Musculoskeletal: Negative for neck pain.  Negative for back pain. Integumentary: Negative for rash. Neurological: Negative for headaches, focal weakness or numbness.   ____________________________________________   PHYSICAL EXAM:  VITAL SIGNS: ED Triage Vitals  Enc Vitals Group     BP 07/07/18 2052 (!) 140/92     Pulse Rate 07/07/18 2052 74     Resp 07/07/18 2052 17     Temp --      Temp src --      SpO2 07/07/18 2052 100 %     Weight --      Height --      Head Circumference --      Peak Flow --      Pain Score 07/07/18 2130 8     Pain Loc --      Pain Edu? --      Excl. in [place]? --     Constitutional: Alert , apparently at her baseline. Eyes: Conjunctivae are normal.  Head: Atraumatic. Nose: No congestion/rhinnorhea. Mouth/Throat: Mucous membranes are moist. Neck: No stridor.  No meningeal signs.   Cardiovascular: Normal rate, regular rhythm. Good peripheral circulation. Grossly normal heart sounds. Respiratory: Normal respiratory effort.  No retractions. Lungs CTAB. Gastrointestinal: Soft and nontender. No distention.  Musculoskeletal: Mild tenderness to palpation of anterior chest wall. No lower extremity tenderness nor edema. No gross deformities of extremities. Neurologic:  Normal speech and language. No gross focal neurologic deficits are appreciated.  Skin:  Skin is warm, dry and intact. No rash noted. Psychiatric: No concerning psychiatric findings at this  time  ____________________________________________   LABS (all labs ordered are listed, but only abnormal results are displayed)  Labs Reviewed  COMPREHENSIVE METABOLIC PANEL - Abnormal; Notable for the following components:      Result Value   Glucose, Bld 105 (*)    Calcium 8.6 (*)    AST 14 (*)    All other components within normal limits  CBC WITH DIFFERENTIAL/PLATELET - Abnormal; Notable for the following components:   RDW 14.8 (*)    All other components within normal limits  LIPASE, BLOOD  TROPONIN I   ____________________________________________  EKG  ED ECG REPORT I, Hinda Kehr, the attending physician, personally viewed and interpreted this ECG.  Date: 07/07/2018 EKG Time: 20: 50 Rate: 75 Rhythm: normal sinus rhythm QRS Axis: normal Intervals: normal ST/T Wave abnormalities: normal Narrative Interpretation: no evidence of acute ischemia  ____________________________________________  RADIOLOGY Ursula Alert, personally viewed and  evaluated these images (plain radiographs) as part of my medical decision making, as well as reviewing the written report by the radiologist.  ED MD interpretation: No evidence of acute intrathoracic abnormality on chest x-ray  Official radiology report(s): Dg Chest 2 View  Result Date: 07/07/2018 CLINICAL DATA:  Chest pain for 2 weeks. EXAM: CHEST - 2 VIEW COMPARISON:  12/18/2016 FINDINGS: Cardiomediastinal silhouette is normal. Mediastinal contours appear intact. There is no evidence of focal airspace consolidation, pleural effusion or pneumothorax. Osseous structures are without acute abnormality. Spondylosis of the thoracic spine. Soft tissues are grossly normal. IMPRESSION: No active cardiopulmonary disease. Electronically Signed   By: Fidela Salisbury M.D.   On: 07/07/2018 21:18    ____________________________________________   PROCEDURES  Critical Care performed: No   Procedure(s) performed:    Procedures   ____________________________________________   INITIAL IMPRESSION / ASSESSMENT AND PLAN / ED COURSE  As part of my medical decision making, I reviewed the following data within the [facility] notes reviewed and incorporated, Labs reviewed , EKG interpreted , Old chart reviewed and Notes from prior ED visits    Differential diagnosis includes, but is not limited to, musculoskeletal pain, ACS, pneumonia, biliary colic, acid reflux or gastritis.  The patient's vital signs have been stable throughout her stay in the emergency department.  Her conference of metabolic panel is reassuring with no evidence of hyperbilirubinemia and normal LFTs.  Lipase is within normal limits.  Troponin is negative which is particularly reassuring given that her symptoms have been persistent for 2 weeks.  No leukocytosis and no anemia.  Chest x-ray is clear with no signs of pneumonia.  Both initial and repeat EKG demonstrated no signs of ischemia.  I do not have a strong estimation of his symptoms but she has a Wells score for pulmonary embolism is 0, no suggestion of ACS, some mild tenderness to palpation of the anterior chest wall.  There is no evidence of any acute emergent condition going on tonight.  I will recommend thousand milligrams of Tylenol every 6 hours and close outpatient follow-up.  Her nurse will discuss the case with the legal guardian and she will be discharged back to her group home.     ____________________________________________  FINAL CLINICAL IMPRESSION(S) / ED DIAGNOSES  Final diagnoses:  Atypical chest pain     MEDICATIONS GIVEN DURING THIS VISIT:  Medications  acetaminophen (TYLENOL) tablet 1,000 mg (1,000 mg Oral Given 07/08/18 0007)     ED Discharge Orders    None       Note:  This document was prepared using Dragon voice recognition software and may include unintentional dictation errors.    Hinda Kehr, MD 07/08/18 [phone]

## 2018-07-07 NOTE — Discharge Instructions (Signed)

## 2018-07-07 NOTE — ED Notes (Signed)
HIPAA compliant msg left at [facility] called at [phone]

## 2018-07-07 NOTE — ED Triage Notes (Signed)
Patient coming from [facility] group home for Chest pain X 2 weeks. Patient reports SOB, and weakness, denies other symptoms. EMS BP 85/61.

## 2018-07-07 NOTE — ED Notes (Signed)
Pt reports CP; EDP notified, EKG conducted

## 2018-07-08 NOTE — ED Notes (Signed)
Call to care giver, Earlie Server, [phone], otw to pick up pt

## 2018-07-08 NOTE — ED Notes (Signed)
Reports given to Dorothey, caregiver, samer signed for DC paperwork

## 2018-07-08 NOTE — ED Notes (Signed)
Peripheral IV discontinued. Catheter intact. No signs of infiltration or redness. Gauze applied to IV site.   Discharge instructions reviewed with patient's guardian/parent. Questions fielded by this RN. Patient's guardian/parent verbalizes understanding of instructions. Patient discharged home with guardian/parent in stable condition per [facility]. No acute distress noted at time of discharge.

## 2018-12-06 IMAGING — CT CT HEAD W/O CM
3 series · 15 of 46 positions shown, 18 images · non-contrast
Comparison: None.

CLINICAL DATA: Recent fall on to right face with loss of right
vision

EXAM:
CT HEAD WITHOUT CONTRAST
TECHNIQUE: Contiguous axial images were obtained from the base of the skull
through the vertex without intravenous contrast.

[Series 2: head wo · axial · 0.40mm/px · z∈[-169,-49]mm · 9 of 29 slices shown, 12 images]
[im 3/29  brain]
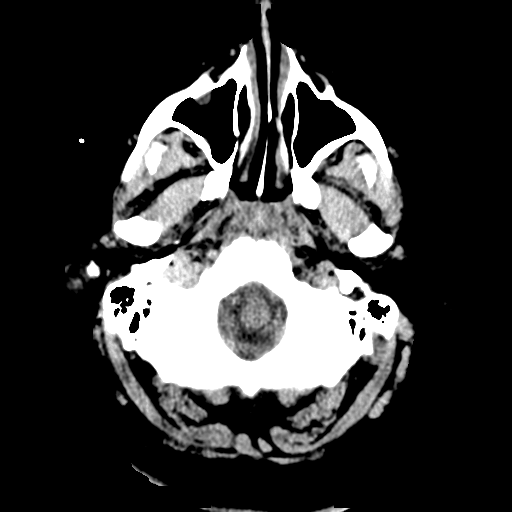
[im 3/29  bone]
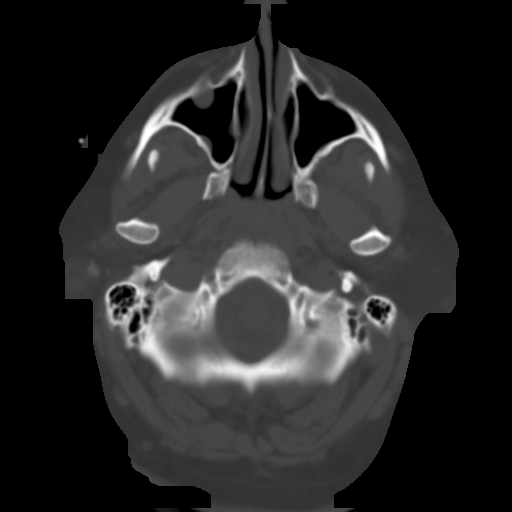
[im 6/29  brain]
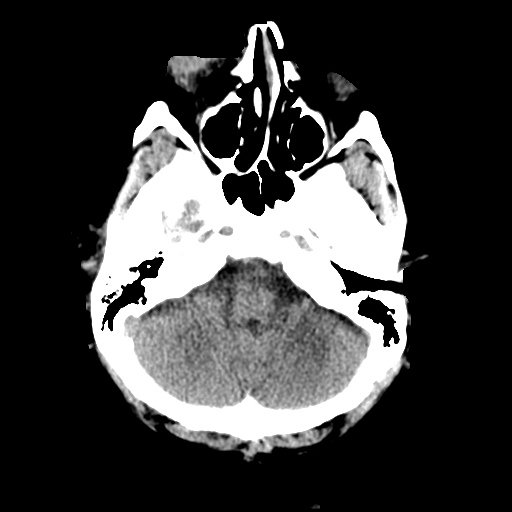
[im 9/29  brain]
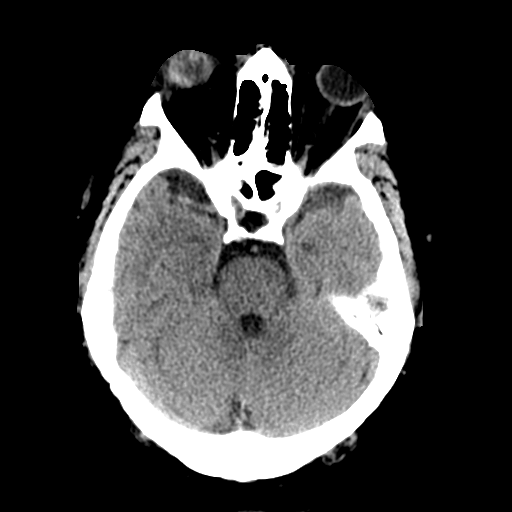
[im 12/29  brain]
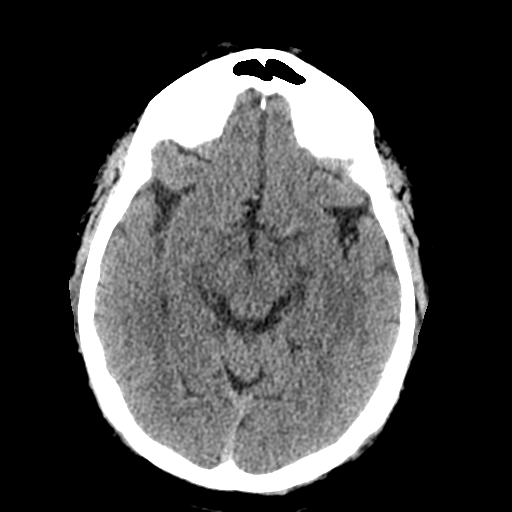
[im 15/29  brain]
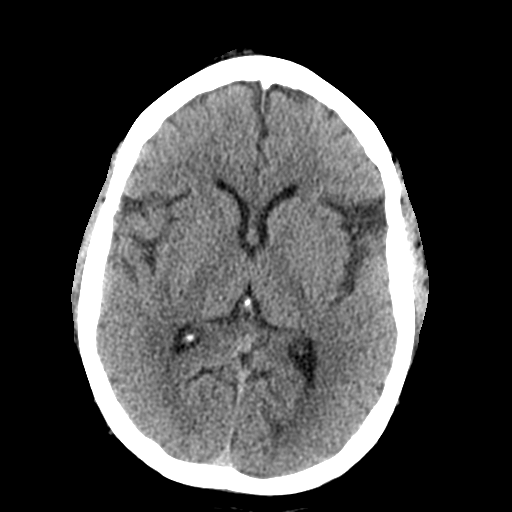
[im 15/29  bone]
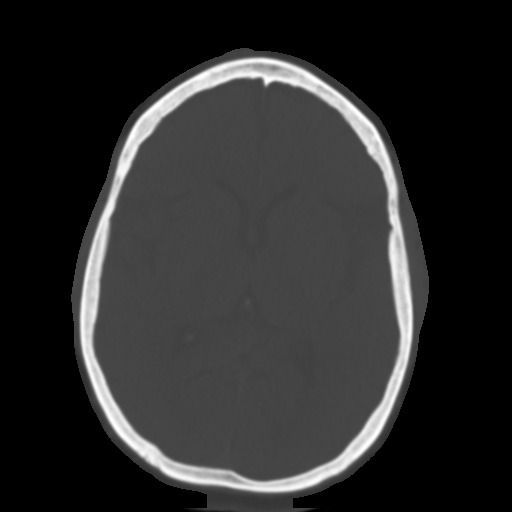
[im 18/29  brain]
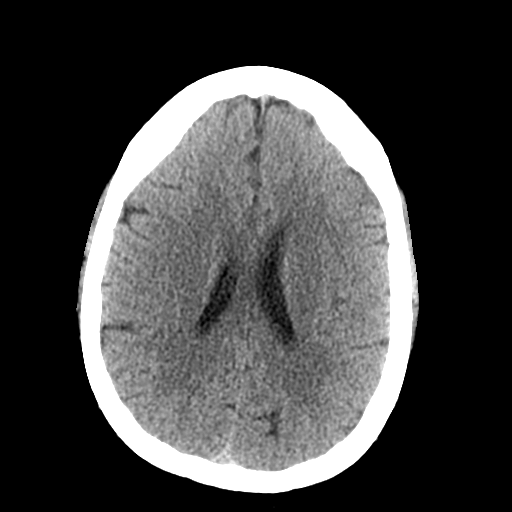
[im 21/29  brain]
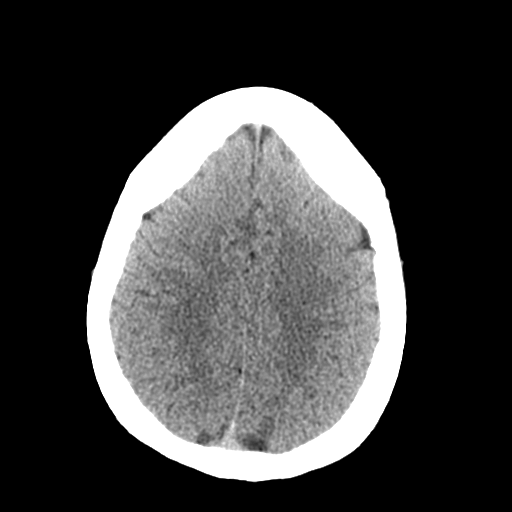
[im 24/29  brain]
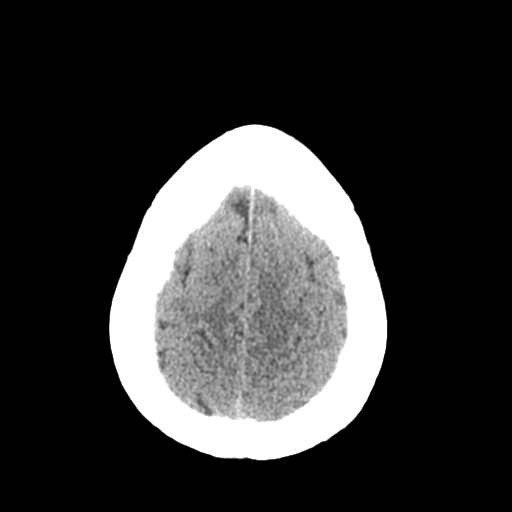
[im 27/29  brain]
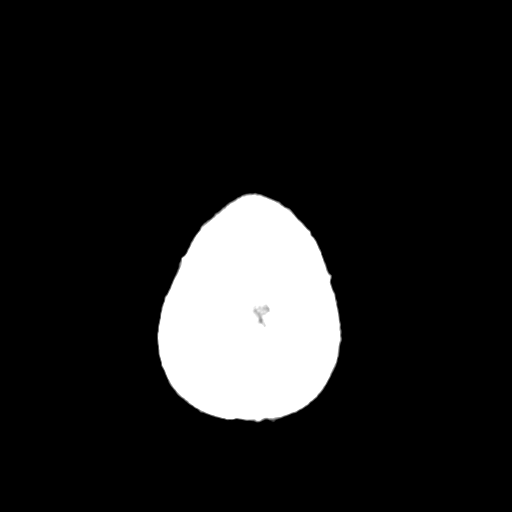
[im 27/29  bone]
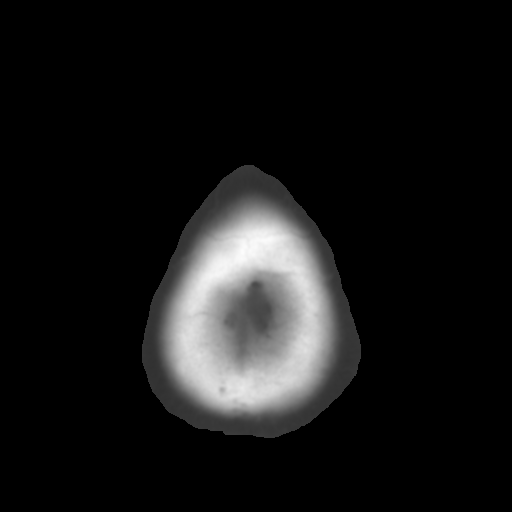

[Series 4: coronal soft tissue · coronal · 0.28mm/px · 3 of 69 slices shown]
[im 23/69  brain]
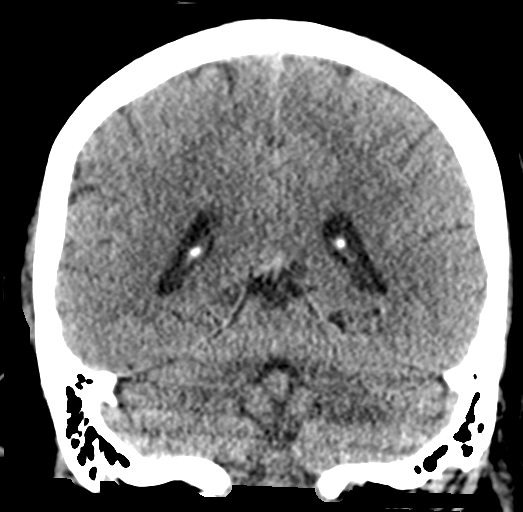
[im 31/69  brain]
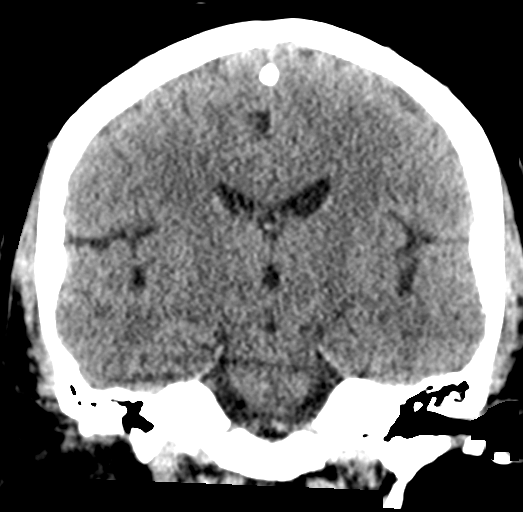
[im 38/69  brain]
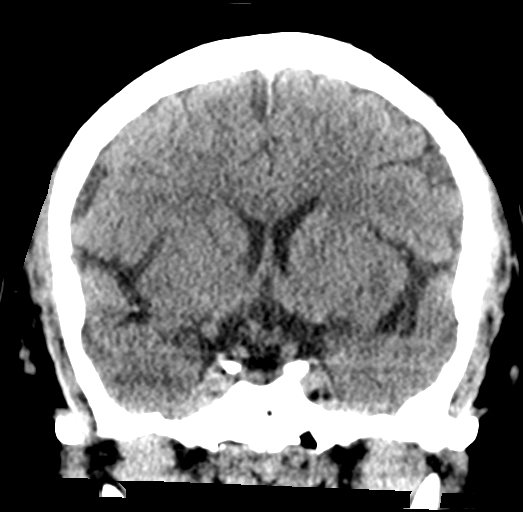

[Series 5: sagittal soft tissue · sagittal · 0.28mm/px · 3 of 50 slices shown]
[im 17/50  brain]
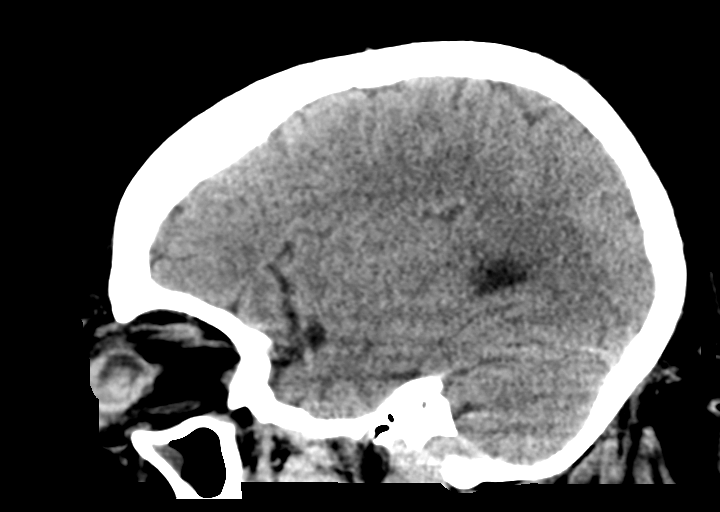
[im 25/50  brain]
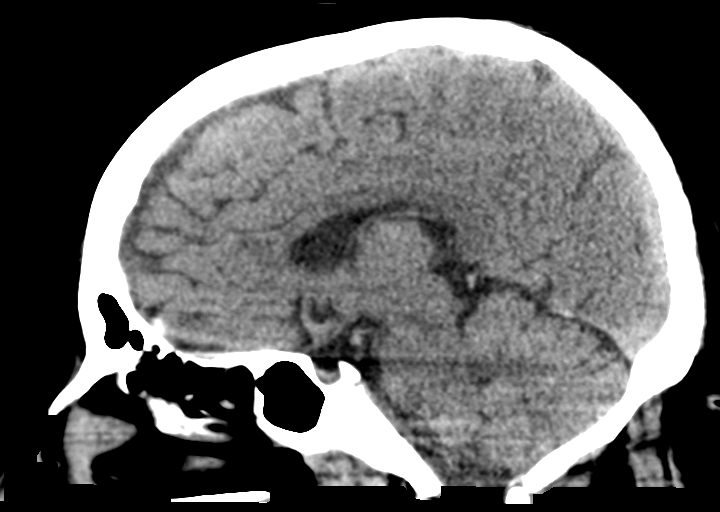
[im 33/50  brain]
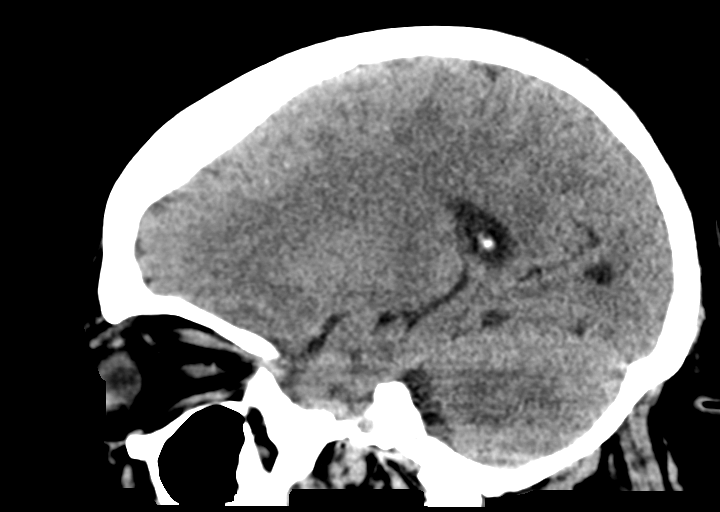

[15 of 46 positions shown; findings below may reference images not displayed]

FINDINGS: Brain: No evidence of acute infarction, hemorrhage, hydrocephalus,
extra-axial collection or mass lesion/mass effect.

Vascular: No hyperdense vessel or unexpected calcification.

Skull: Normal. Negative for fracture or focal lesion.

Sinuses/Orbits: The left globe is within normal limits. There is
considerable soft tissue swelling over the right orbit consistent
with the recent injury. Additionally there is hyperdense material
along the posterior margin of the globe diffusely likely
representing hemorrhage. This would correspond with the patient's
given clinical history of vision loss. No other orbital abnormality
on the right is noted.

Other: None.
IMPRESSION: Changes in the right periorbital region consistent with the recent
fall. Additionally there is apparent hemorrhage within the posterior
aspect of the globe on the right.

No acute intracranial abnormality noted.

## 2018-12-23 ENCOUNTER — Encounter: Payer: Self-pay | Admitting: Psychiatry

## 2018-12-23 ENCOUNTER — Ambulatory Visit (INDEPENDENT_AMBULATORY_CARE_PROVIDER_SITE_OTHER): Payer: Medicare HMO | Admitting: Psychiatry

## 2018-12-23 DIAGNOSIS — F200 Paranoid schizophrenia: Secondary | ICD-10-CM

## 2018-12-23 DIAGNOSIS — F79 Unspecified intellectual disabilities: Secondary | ICD-10-CM | POA: Diagnosis not present

## 2018-12-23 MED ORDER — LORAZEPAM 0.5 MG PO TABS: 0.5000 mg | ORAL_TABLET | Freq: Two times a day (BID) | ORAL | 5 refills | Status: DC

## 2018-12-23 MED ORDER — BENZTROPINE MESYLATE 2 MG PO TABS: 2.0000 mg | ORAL_TABLET | Freq: Every day | ORAL | 6 refills | Status: DC

## 2018-12-23 MED ORDER — RISPERIDONE 2 MG PO TABS: 2.0000 mg | ORAL_TABLET | Freq: Two times a day (BID) | ORAL | 5 refills | Status: DC

## 2018-12-23 NOTE — Progress Notes (Signed)
BH MD/PA/NP OP Progress Note  12/23/2018 12:35 PM Kylie Saunders  MRN:  [id number]  Subjective:    Pt is a 58 yo female with h/o Schizophrenia presented for follow up accompanied  by the staff at the group home.  Staff reported that patient is doing well.  Staff reported that she has been picking on her face on her hands and her primary care physician has suggested that her medications need to be evaluated.  Discussed with patient about starting her on Luvox but she refused.  She reported that she does not want to starting any new medications at this time.  Staff reported that she has been compliant with her medication and her lorazepam is now twice daily and they have not noticed any change in her behavior.  She takes medications as prescribed.  She denied having any perceptual disturbances.  She appeared calm and alert during the interview.   She has also been going to the day program on a regular basis and has been taking her medications as prescribed.  She is getting her blood work done at the primary care physician office and they are monitoring her regularly.  She sleeps well at night.  No change in her weight at this time.  Her blood pressure remains stable.  She denied having any suicidal homicidal ideations or plans.     She gets along well with the patient's at the home.  No acute issues noted at this time.      Chief Complaint:   Visit Diagnosis:     ICD-10-CM   1. Paranoid schizophrenia, chronic condition ([place]) F20.0   2. Intellectual disability F75     Past Medical History:  Past Medical History:  Diagnosis Date  . Acute pancreatitis   . Anxiety   . Cancer of body of uterus ([place]) 05/31/2012  . HLD (hyperlipidemia) 07/25/2012  . Hypertension   . Intellectual disability 07/25/2012  . Obesity   . Pancreatitis due to biliary obstruction   . Paranoid schizophrenia ([place])   . Right eye injury 11/03/2016    Past Surgical History:  Procedure Laterality Date  . ABDOMINAL  HYSTERECTOMY    . CHOLECYSTECTOMY N/A 01/24/2017   Procedure: LAPAROSCOPIC CHOLECYSTECTOMY WITH INTRAOPERATIVE CHOLANGIOGRAM;  Surgeon: Florene Glen, MD;  Location: [facility] ORS;  Service: General;  Laterality: N/A;  . EYE SURGERY    . TONSILLECTOMY     Family History:  Family History  Problem Relation Age of Onset  . Diabetes Neg Hx   . Hypertension Neg Hx    Social History:  Social History   Socioeconomic History  . Marital status: Single    Spouse name: Not on file  . Number of children: Not on file  . Years of education: Not on file  . Highest education level: Not on file  Occupational History  . Not on file  Social Needs  . Financial resource strain: Not on file  . Food insecurity:    Worry: Not on file    Inability: Not on file  . Transportation needs:    Medical: Not on file    Non-medical: Not on file  Tobacco Use  . Smoking status: Never Smoker  . Smokeless tobacco: Never Used  Substance and Sexual Activity  . Alcohol use: No    Alcohol/week: 0.0 standard drinks  . Drug use: No  . Sexual activity: Not Currently  Lifestyle  . Physical activity:    Days per week: Not on file    Minutes  per session: Not on file  . Stress: Not on file  Relationships  . Social connections:    Talks on phone: Not on file    Gets together: Not on file    Attends religious service: Not on file    Active member of club or organization: Not on file    Attends meetings of clubs or organizations: Not on file    Relationship status: Not on file  Other Topics Concern  . Not on file  Social History Narrative  . Not on file   Additional History:  Lives at  [facility]  group home  Assessment:   Musculoskeletal: Strength & Muscle Tone: within normal limits Gait & Station: normal Patient leans: N/A  Psychiatric Specialty Exam: Medication Refill  Pertinent negatives include no congestion.    Review of Systems  Constitutional: Negative for malaise/fatigue.  HENT: Negative for  congestion and tinnitus.   Eyes: Negative for pain.  Respiratory: Negative for hemoptysis.   Cardiovascular: Negative for claudication.  Gastrointestinal: Negative for diarrhea.  Genitourinary: Negative for frequency.  Musculoskeletal: Negative for back pain.  Neurological: Negative for speech change.  Endo/Heme/Allergies: Negative for environmental allergies.  Psychiatric/Behavioral: Negative for depression and hallucinations. The patient does not have insomnia.   All other systems reviewed and are negative.   There were no vitals taken for this visit.There is no height or weight on file to calculate BMI.  General Appearance: Casual  Eye Contact:  Fair  Speech:  Slow  Volume:  Decreased  Mood:  Anxious  Affect:  Congruent  Thought Process:  Coherent and Goal Directed  Orientation:  Full (Time, Place, and Person)  Thought Content:  WDL  Suicidal Thoughts:  No  Homicidal Thoughts:  No  Memory:  Immediate;   Fair  Judgement:  Fair  Insight:  Fair and Shallow  Psychomotor Activity:  Decreased  Concentration:  Fair  Recall:  [employer] of Knowledge: Poor  Language: Fair  Akathisia:  No  Handed:  Right  AIMS (if indicated):  none  Assets:  Communication Skills Social Support  ADL's:  Intact  Cognition: WNL  Sleep:  7   Is the patient at risk to self?  No. Has the patient been a risk to self in the past 6 months?  No. Has the patient been a risk to self within the distant past?  No. Is the patient a risk to others?  No. Has the patient been a risk to others in the past 6 months?  No. Has the patient been a risk to others within the distant past?  No.  Current Medications: Current Outpatient Medications  Medication Sig Dispense Refill  . amLODipine (NORVASC) 5 MG tablet Take 5 mg by mouth daily.    . benztropine (COGENTIN) 2 MG tablet Take 1 tablet (2 mg total) by mouth daily. 30 tablet 6  . fluticasone (FLONASE) 50 MCG/ACT nasal spray Place into both nostrils daily.     Marland Kitchen lisinopril-hydrochlorothiazide (PRINZIDE,ZESTORETIC) 20-12.5 MG tablet Take 0.5 tablets by mouth daily.    Marland Kitchen loratadine (CLARITIN) 10 MG tablet Take 10 mg by mouth daily.    Marland Kitchen LORazepam (ATIVAN) 0.5 MG tablet Take 1 tablet (0.5 mg total) by mouth 2 (two) times daily. 60 tablet 5  . Polyvinyl Alcohol-Povidone (REFRESH OP) Apply 1 application to eye 2 (two) times daily. Right eye only    . risperiDONE (RISPERDAL) 2 MG tablet Take 1 tablet (2 mg total) by mouth 2 (two) times daily. 60 tablet  5  . simvastatin (ZOCOR) 40 MG tablet Take 40 mg by mouth daily.     Marland Kitchen White Petrolatum-Mineral Oil (EYE LUBRICANT) OINT Administer 1 application to the right eye Two (2) times a day.     No current facility-administered medications for this visit.     Medical Decision Making:  Established Problem, Stable/Improving (1)  Treatment Plan Summary:Medication management   Continued Risperdal 2 mg by mouth twice a day And Cogentin 2 mg at bedtime.  I will decrease the dose of lorazepam 0.5 mg p.o. twice daily.  She was given a prescription of lorazepam with 5 refills Follow-up in 6 months   More than 50% of the time spent in psychoeducation, counseling and coordination of care.    This note was generated in part or whole with voice recognition software. Voice regonition is usually quite accurate but there are transcription errors that can and very often do occur. I apologize for any typographical errors that were not detected and corrected.    Rainey Pines, MD  12/23/2018, 12:35 PM

## 2019-07-30 ENCOUNTER — Other Ambulatory Visit: Payer: Self-pay | Admitting: Psychiatry

## 2019-07-31 ENCOUNTER — Other Ambulatory Visit: Payer: Self-pay

## 2019-07-31 MED ORDER — LORAZEPAM 0.5 MG PO TABS: 0.5000 mg | ORAL_TABLET | Freq: Two times a day (BID) | ORAL | 0 refills | Status: DC

## 2019-07-31 NOTE — Progress Notes (Unsigned)
Patient is scheduled for an appointment on Monday, she is out of her Lorazepam, after reviewing patients chart and consulting with Dr. Shea Evans I called in a 5 day supply (10 tabs) just to get her to her appointment.

## 2019-08-04 ENCOUNTER — Ambulatory Visit (INDEPENDENT_AMBULATORY_CARE_PROVIDER_SITE_OTHER): Payer: Medicare HMO | Admitting: Psychiatry

## 2019-08-04 ENCOUNTER — Encounter: Payer: Self-pay | Admitting: Psychiatry

## 2019-08-04 ENCOUNTER — Other Ambulatory Visit: Payer: Self-pay

## 2019-08-04 DIAGNOSIS — F79 Unspecified intellectual disabilities: Secondary | ICD-10-CM

## 2019-08-04 DIAGNOSIS — F200 Paranoid schizophrenia: Secondary | ICD-10-CM

## 2019-08-04 MED ORDER — BENZTROPINE MESYLATE 2 MG PO TABS: 2.0000 mg | ORAL_TABLET | Freq: Every day | ORAL | 6 refills | Status: DC

## 2019-08-04 MED ORDER — RISPERIDONE 2 MG PO TABS: 2.0000 mg | ORAL_TABLET | Freq: Two times a day (BID) | ORAL | 5 refills | Status: DC

## 2019-08-04 MED ORDER — LORAZEPAM 0.5 MG PO TABS: 0.5000 mg | ORAL_TABLET | Freq: Two times a day (BID) | ORAL | 1 refills | Status: DC

## 2019-08-04 NOTE — Progress Notes (Signed)
Patient ID: Kylie Saunders, female   DOB: [birth year], 58 y.o.   MRN: [id number]  Patient is a 58 year old female with history of Schizophrenia intellectual disabilityWho was evaluated for follow-up. She appeared calm and alert over the phone. She currently lives in the group home. Staff member reported that she has been doing well and no other acute symptoms noted. She has been compliant with her medications. She spends time at the group home. She is involved in activities at the group home. She has been taking her medications as prescribed and no paranoid delusional thinking noted.  Plan. I called her medications at the pharmacy. She will follow-up in three months earlier depending on her symptoms. I connected with patient via telemedicine application and verified that I am speaking with the correct person using two identifiers.  I discussed the limitations of evaluation and management by telemedicine and the availability of in person appointments. The patient expressed understanding and agreed to proceed.   I discussed the assessment and treatment plan with the patient. The patient was provided an opportunity to ask questions and all were answered. The patient agreed with the plan and demonstrated an understanding of the instructions.   The patient was advised to call back or seek an in-person evaluation if the symptoms worsen or if the condition fails to improve as anticipated.   I provided 15 minutes of non-face-to-face time during this encounter.

## 2019-08-05 ENCOUNTER — Other Ambulatory Visit: Payer: Self-pay | Admitting: Psychiatry

## 2019-08-05 ENCOUNTER — Telehealth: Payer: Self-pay

## 2019-08-05 MED ORDER — LORAZEPAM 0.5 MG PO TABS: 0.5000 mg | ORAL_TABLET | Freq: Two times a day (BID) | ORAL | 5 refills | Status: DC

## 2019-08-05 NOTE — Telephone Encounter (Signed)
I am not sure why she was given only 10 pills . I would recommend discussing with Dr.Faheem. I can give few pills until next Monday. Please let me know if this is OK .

## 2019-08-05 NOTE — Telephone Encounter (Signed)
Called in Lorazepam - 0.5mg  po BID #60 with 5 refills -to the pharmacy

## 2019-08-05 NOTE — Telephone Encounter (Signed)
they did not fill the 10 because pt usually takes it twice a day and with 90 day supply.  pt already missed am dose.

## 2019-08-05 NOTE — Telephone Encounter (Signed)
pt was seen yesterday and Dr. Gretel Acre did patient rx wrong for ativan. pt care giver states that pt has been takin 3-4 years now ativan .5mg  twice a day and that dr. Gretel Acre has been given pt a 90 day supply but when they go the new rx it was #10 .  they need to know why she made these changes. And they need enough refills to do until they can find another provider since dr. Gretel Acre last day is oct 1.

## 2020-01-05 ENCOUNTER — Telehealth: Payer: Self-pay

## 2020-01-05 NOTE — Telephone Encounter (Signed)
  Received fax from [facility] pt needs refills on medication  LORazepam (ATIVAN) 0.5 MG tablet Medication Date: 08/05/2019 Department: [facility] Ordering/Authorizing: Rainey Pines, MD  Order Providers  Prescribing Provider Encounter Provider  Rainey Pines, MD Rainey Pines, MD  Outpatient Medication Detail   Disp Refills Start End   LORazepam (ATIVAN) 0.5 MG tablet 60 tablet 5 08/05/2019    Sig - Route: Take 1 tablet (0.5 mg total) by mouth 2 (two) times daily. - Oral   Class: Phone In   [place], [place] - [place]

## 2020-01-06 MED ORDER — LORAZEPAM 0.5 MG PO TABS: 0.5000 mg | ORAL_TABLET | Freq: Two times a day (BID) | ORAL | 0 refills | Status: DC

## 2020-01-06 NOTE — Telephone Encounter (Signed)
Done

## 2020-01-13 ENCOUNTER — Ambulatory Visit (INDEPENDENT_AMBULATORY_CARE_PROVIDER_SITE_OTHER): Payer: Medicare HMO | Admitting: Psychiatry

## 2020-01-13 ENCOUNTER — Encounter: Payer: Self-pay | Admitting: Psychiatry

## 2020-01-13 ENCOUNTER — Other Ambulatory Visit: Payer: Self-pay

## 2020-01-13 DIAGNOSIS — F200 Paranoid schizophrenia: Secondary | ICD-10-CM

## 2020-01-13 DIAGNOSIS — F79 Unspecified intellectual disabilities: Secondary | ICD-10-CM | POA: Diagnosis not present

## 2020-01-13 MED ORDER — LORAZEPAM 0.5 MG PO TABS: 0.5000 mg | ORAL_TABLET | Freq: Two times a day (BID) | ORAL | 3 refills | Status: DC

## 2020-01-13 MED ORDER — BENZTROPINE MESYLATE 2 MG PO TABS: 2.0000 mg | ORAL_TABLET | Freq: Every day | ORAL | 3 refills | Status: DC

## 2020-01-13 MED ORDER — RISPERIDONE 2 MG PO TABS: 2.0000 mg | ORAL_TABLET | Freq: Two times a day (BID) | ORAL | 3 refills | Status: DC

## 2020-01-13 MED ORDER — RISPERIDONE 2 MG PO TABS
2.0000 mg | ORAL_TABLET | Freq: Two times a day (BID) | ORAL | 3 refills | Status: DC
Start: 2020-01-13 — End: 2020-01-13

## 2020-01-13 NOTE — Progress Notes (Signed)
[facility] MD OP Progress Note  I connected with  Kylie Saunders on 01/13/20 by a video enabled telemedicine application and verified that I am speaking with the correct person using two identifiers.   I discussed the limitations of evaluation and management by telemedicine. The patient expressed understanding and agreed to proceed.  The session was switched to phone encounter/visit due to audio/technical issues.    01/13/2020 10:47 AM Kylie Saunders  MRN:  [id number]  Chief Complaint: " I am doing fine."  HPI: Patient is a resident of a assisted living facility.  Staff informed that patient has been doing well and they have no behavioral concerns about her.  Patient informed that she is okay and denied any acute issues or concerns.  She informed that she has been living in the facility for the past 7 years.  Prior to living there she used to live in a living facility back in [place] and she came to [place] with her brother in 2013.  She informed that her mother lives in [place] and she talks to her every Sunday. Patient denied any auditory or visual hallucinations.  She denied any paranoid ideations or delusions. She informed that she sleeps well and denied any nightmares.  Visit Diagnosis:    ICD-10-CM   1. Paranoid schizophrenia, chronic condition ([place])  F20.0   2. Intellectual disability  F74     Past Psychiatric History: Schizophrenia  Past Medical History:  Past Medical History:  Diagnosis Date  . Acute pancreatitis   . Anxiety   . Cancer of body of uterus ([place]) 05/31/2012  . HLD (hyperlipidemia) 07/25/2012  . Hypertension   . Intellectual disability 07/25/2012  . Obesity   . Pancreatitis due to biliary obstruction   . Paranoid schizophrenia ([place])   . Right eye injury 11/03/2016    Past Surgical History:  Procedure Laterality Date  . ABDOMINAL HYSTERECTOMY    . CHOLECYSTECTOMY N/A 01/24/2017   Procedure: LAPAROSCOPIC CHOLECYSTECTOMY WITH INTRAOPERATIVE CHOLANGIOGRAM;  Surgeon: Florene Glen, MD;  Location: [facility] ORS;  Service: General;  Laterality: N/A;  . EYE SURGERY    . TONSILLECTOMY      Family Psychiatric History: denied  Family History:  Family History  Problem Relation Age of Onset  . Diabetes Neg Hx   . Hypertension Neg Hx     Social History:  Social History   Socioeconomic History  . Marital status: Single    Spouse name: Not on file  . Number of children: Not on file  . Years of education: Not on file  . Highest education level: Not on file  Occupational History  . Not on file  Tobacco Use  . Smoking status: Never Smoker  . Smokeless tobacco: Never Used  Substance and Sexual Activity  . Alcohol use: No    Alcohol/week: 0.0 standard drinks  . Drug use: No  . Sexual activity: Not Currently  Other Topics Concern  . Not on file  Social History Narrative  . Not on file   Social Determinants of Health   Financial Resource Strain:   . Difficulty of Paying Living Expenses: Not on file  Food Insecurity:   . Worried About Charity fundraiser in the Last Year: Not on file  . Ran Out of Food in the Last Year: Not on file  Transportation Needs:   . Lack of Transportation (Medical): Not on file  . Lack of Transportation (Non-Medical): Not on file  Physical Activity:   . Days of Exercise  per Week: Not on file  . Minutes of Exercise per Session: Not on file  Stress:   . Feeling of Stress : Not on file  Social Connections:   . Frequency of Communication with Friends and Family: Not on file  . Frequency of Social Gatherings with Friends and Family: Not on file  . Attends Religious Services: Not on file  . Active Member of Clubs or Organizations: Not on file  . Attends Archivist Meetings: Not on file  . Marital Status: Not on file    Allergies: No Known Allergies  Metabolic Disorder Labs: No results found for: HGBA1C, MPG No results found for: PROLACTIN Lab Results  Component Value Date   CHOL 165 06/05/2016   TRIG 192 (H)  06/05/2016   HDL 39 (L) 06/05/2016   LDLCALC 88 06/05/2016   Lab Results  Component Value Date   TSH 2.610 06/05/2016    Therapeutic Level Labs: No results found for: LITHIUM No results found for: VALPROATE No components found for:  CBMZ  Current Medications: Current Outpatient Medications  Medication Sig Dispense Refill  . amLODipine (NORVASC) 5 MG tablet Take 5 mg by mouth daily.    . benztropine (COGENTIN) 2 MG tablet Take 1 tablet (2 mg total) by mouth daily. 30 tablet 6  . fluticasone (FLONASE) 50 MCG/ACT nasal spray Place into both nostrils daily.    Marland Kitchen lisinopril-hydrochlorothiazide (PRINZIDE,ZESTORETIC) 20-12.5 MG tablet Take 0.5 tablets by mouth daily.    Marland Kitchen loratadine (CLARITIN) 10 MG tablet Take 10 mg by mouth daily.    Marland Kitchen LORazepam (ATIVAN) 0.5 MG tablet Take 1 tablet (0.5 mg total) by mouth 2 (two) times daily. 60 tablet 0  . Polyvinyl Alcohol-Povidone (REFRESH OP) Apply 1 application to eye 2 (two) times daily. Right eye only    . risperiDONE (RISPERDAL) 2 MG tablet Take 1 tablet (2 mg total) by mouth 2 (two) times daily. 60 tablet 5  . simvastatin (ZOCOR) 40 MG tablet Take 40 mg by mouth daily.     Marland Kitchen White Petrolatum-Mineral Oil (EYE LUBRICANT) OINT Administer 1 application to the right eye Two (2) times a day.     No current facility-administered medications for this visit.     Musculoskeletal: Strength & Muscle Tone: unable to assess due to telemed visit Gait & Station: unable to assess due to telemed visit Patient leans: unable to assess due to telemed visit   Psychiatric Specialty Exam: Review of Systems  There were no vitals taken for this visit.There is no height or weight on file to calculate BMI.  General Appearance: Fairly Groomed  Eye Contact:  Fair  Speech:  Clear and Coherent and Normal Rate  Volume:  Normal  Mood:  Euthymic  Affect:  Restricted  Thought Process:  Goal Directed and Descriptions of Associations: Intact  Orientation:  Full (Time,  Place, and Person)  Thought Content: Logical   Suicidal Thoughts:  No  Homicidal Thoughts:  No  Memory:  Immediate;   Good Recent;   Good  Judgement:  Fair  Insight:  Fair  Psychomotor Activity:  Normal  Concentration:  Concentration: Good and Attention Span: Good  Recall:  Good  Fund of Knowledge: Fair  Language: Good  Akathisia:  Negative  Handed:  Right  AIMS (if indicated): not done  Assets:  Communication Skills Desire for Improvement Financial Resources/Insurance Housing Social Support  ADL's:  Intact  Cognition: WNL  Sleep:  Good     Assessment and Plan: 59 year old female with  history of schizophrenia, intellectual disability residing in a assisted living facility now stable on her current medication regimen.  1. Paranoid schizophrenia, chronic condition ([facility])  risperiDONE (RISPERDAL) 2 MG tablet; Take 1 tablet (2 mg total) by mouth 2 (two) times daily.  Dispense: 60 tablet; Refill: 3 Benztropine 2 mg daily Lorazepam 0.5 mg BID    2. Intellectual disability   Continue same medication regimen. Follow up in 3 months.   [place] Crane, MD 01/13/2020, 10:47 AM

## 2020-04-07 ENCOUNTER — Telehealth (INDEPENDENT_AMBULATORY_CARE_PROVIDER_SITE_OTHER): Payer: Medicare HMO | Admitting: Psychiatry

## 2020-04-07 ENCOUNTER — Other Ambulatory Visit: Payer: Self-pay

## 2020-04-07 ENCOUNTER — Encounter: Payer: Self-pay | Admitting: Psychiatry

## 2020-04-07 DIAGNOSIS — F79 Unspecified intellectual disabilities: Secondary | ICD-10-CM

## 2020-04-07 DIAGNOSIS — F200 Paranoid schizophrenia: Secondary | ICD-10-CM

## 2020-04-07 MED ORDER — BENZTROPINE MESYLATE 2 MG PO TABS: 2.0000 mg | ORAL_TABLET | Freq: Every day | ORAL | 3 refills | Status: DC

## 2020-04-07 MED ORDER — LORAZEPAM 0.5 MG PO TABS: 0.5000 mg | ORAL_TABLET | Freq: Two times a day (BID) | ORAL | 3 refills | Status: DC

## 2020-04-07 MED ORDER — RISPERIDONE 2 MG PO TABS: 2.0000 mg | ORAL_TABLET | Freq: Two times a day (BID) | ORAL | 3 refills | Status: DC

## 2020-04-07 NOTE — Progress Notes (Addendum)
[facility] [facility] MD OP Progress Note  Virtual Visit via Video Note  I connected with Kylie Saunders on 04/07/20 at  2:00 PM EDT by a video enabled telemedicine application and verified that I am speaking with the correct person using two identifiers.  Location: Patient: Home Provider: Clinic   I discussed the limitations of evaluation and management by telemedicine and the availability of in person appointments. The patient expressed understanding and agreed to proceed.  I provided 12 minutes of non-face-to-face time during this encounter.     04/07/2020 2:54 PM Kylie Saunders  MRN:  [id number]  Chief Complaint: " Things are going great."  HPI: Patient is a resident of a assisted living facility.  Today she was observed sitting in the day area with facility staff. She reports that she is doing well. She notes that Risperidone and ativan is effective in managing her psychiatric condition. She denies adverse effects of medication. Today she denies SI/HI/VAH. She endorse adequate sleep. Facility staff reports that she is compliant with her medication and gets along well with staff and other residents. Patient is agreeable to continue current medication regimen. No other concerns noted at this time.      Visit Diagnosis:    ICD-10-CM   1. Paranoid schizophrenia, chronic condition ([place])  F20.0   2. Schizophrenia, paranoid ([place])  F20.0 benztropine (COGENTIN) 2 MG tablet    LORazepam (ATIVAN) 0.5 MG tablet    risperiDONE (RISPERDAL) 2 MG tablet    Past Psychiatric History: Schizophrenia  Past Medical History:  Past Medical History:  Diagnosis Date  . Acute pancreatitis   . Anxiety   . Cancer of body of uterus ([place]) 05/31/2012  . HLD (hyperlipidemia) 07/25/2012  . Hypertension   . Intellectual disability 07/25/2012  . Obesity   . Pancreatitis due to biliary obstruction   . Paranoid schizophrenia ([place])   . Right eye injury 11/03/2016    Past Surgical History:  Procedure Laterality Date  .  ABDOMINAL HYSTERECTOMY    . CHOLECYSTECTOMY N/A 01/24/2017   Procedure: LAPAROSCOPIC CHOLECYSTECTOMY WITH INTRAOPERATIVE CHOLANGIOGRAM;  Surgeon: Florene Glen, MD;  Location: [facility] ORS;  Service: General;  Laterality: N/A;  . EYE SURGERY    . TONSILLECTOMY      Family Psychiatric History: denied  Family History:  Family History  Problem Relation Age of Onset  . Diabetes Neg Hx   . Hypertension Neg Hx     Social History:  Social History   Socioeconomic History  . Marital status: Single    Spouse name: Not on file  . Number of children: Not on file  . Years of education: Not on file  . Highest education level: Not on file  Occupational History  . Not on file  Tobacco Use  . Smoking status: Never Smoker  . Smokeless tobacco: Never Used  Substance and Sexual Activity  . Alcohol use: No    Alcohol/week: 0.0 standard drinks  . Drug use: No  . Sexual activity: Not Currently  Other Topics Concern  . Not on file  Social History Narrative  . Not on file   Social Determinants of Health   Financial Resource Strain:   . Difficulty of Paying Living Expenses:   Food Insecurity:   . Worried About Charity fundraiser in the Last Year:   . Arboriculturist in the Last Year:   Transportation Needs:   . Film/video editor (Medical):   Marland Kitchen Lack of Transportation (Non-Medical):   Physical Activity:   .  Days of Exercise per Week:   . Minutes of Exercise per Session:   Stress:   . Feeling of Stress :   Social Connections:   . Frequency of Communication with Friends and Family:   . Frequency of Social Gatherings with Friends and Family:   . Attends Religious Services:   . Active Member of Clubs or Organizations:   . Attends Archivist Meetings:   Marland Kitchen Marital Status:     Allergies: No Known Allergies  Metabolic Disorder Labs: No results found for: HGBA1C, MPG No results found for: PROLACTIN Lab Results  Component Value Date   CHOL 165 06/05/2016   TRIG 192 (H)  06/05/2016   HDL 39 (L) 06/05/2016   LDLCALC 88 06/05/2016   Lab Results  Component Value Date   TSH 2.610 06/05/2016    Therapeutic Level Labs: No results found for: LITHIUM No results found for: VALPROATE No components found for:  CBMZ  Current Medications: Current Outpatient Medications  Medication Sig Dispense Refill  . amLODipine (NORVASC) 5 MG tablet Take 5 mg by mouth daily.    . benztropine (COGENTIN) 2 MG tablet Take 1 tablet (2 mg total) by mouth daily. 30 tablet 3  . fluticasone (FLONASE) 50 MCG/ACT nasal spray Place into both nostrils daily.    Marland Kitchen lisinopril-hydrochlorothiazide (PRINZIDE,ZESTORETIC) 20-12.5 MG tablet Take 0.5 tablets by mouth daily.    Marland Kitchen loratadine (CLARITIN) 10 MG tablet Take 10 mg by mouth daily.    Marland Kitchen LORazepam (ATIVAN) 0.5 MG tablet Take 1 tablet (0.5 mg total) by mouth 2 (two) times daily. 60 tablet 3  . Polyvinyl Alcohol-Povidone (REFRESH OP) Apply 1 application to eye 2 (two) times daily. Right eye only    . risperiDONE (RISPERDAL) 2 MG tablet Take 1 tablet (2 mg total) by mouth 2 (two) times daily. 60 tablet 3  . simvastatin (ZOCOR) 40 MG tablet Take 40 mg by mouth daily.     Marland Kitchen White Petrolatum-Mineral Oil (EYE LUBRICANT) OINT Administer 1 application to the right eye Two (2) times a day.     No current facility-administered medications for this visit.     Musculoskeletal: Strength & Muscle Tone: unable to assess due to telemed visit Gait & Station: unable to assess due to telemed visit Patient leans: unable to assess due to telemed visit   Psychiatric Specialty Exam: Review of Systems  There were no vitals taken for this visit.There is no height or weight on file to calculate BMI.  General Appearance: Fairly Groomed  Eye Contact:  Fair  Speech:  Clear and Coherent and Normal Rate  Volume:  Normal  Mood:  Euthymic  Affect:  Restricted  Thought Process:  Goal Directed and Descriptions of Associations: Intact  Orientation:  Full (Time,  Place, and Person)  Thought Content: Logical   Suicidal Thoughts:  No  Homicidal Thoughts:  No  Memory:  Immediate;   Good Recent;   Good  Judgement:  Fair  Insight:  Fair  Psychomotor Activity:  Normal  Concentration:  Concentration: Good and Attention Span: Good  Recall:  Good  Fund of Knowledge: Fair  Language: Good  Akathisia:  Negative  Handed:  Right  AIMS (if indicated): not done  Assets:  Communication Skills Desire for Improvement Financial Resources/Insurance Housing Social Support  ADL's:  Intact  Cognition: WNL  Sleep:  Good     Assessment and Plan: Patient reports that she is doing well on current medication regimen and is agreeable to continue.  1. Schizophrenia, paranoid ([place])  - benztropine (COGENTIN) 2 MG tablet; Take 1 tablet (2 mg total) by mouth daily.  Dispense: 30 tablet; Refill: 3 - LORazepam (ATIVAN) 0.5 MG tablet; Take 1 tablet (0.5 mg total) by mouth 2 (two) times daily.  Dispense: 60 tablet; Refill: 3 - risperiDONE (RISPERDAL) 2 MG tablet; Take 1 tablet (2 mg total) by mouth 2 (two) times daily.  Dispense: 60 tablet; Refill: 3  2. Intellectual disability   Continue same medication regimen. Follow up in 3 months.   Salley Slaughter, NP 04/07/2020, 2:54 PM    I saw and managed the patient with NP Ms. Zigmund Gottron.  [place] Crane, MD 04/07/2020 3:00 PM

## 2020-05-25 ENCOUNTER — Other Ambulatory Visit: Payer: Self-pay | Admitting: Psychiatry

## 2020-05-25 DIAGNOSIS — F200 Paranoid schizophrenia: Secondary | ICD-10-CM

## 2020-05-27 ENCOUNTER — Telehealth (HOSPITAL_COMMUNITY): Payer: Self-pay | Admitting: *Deleted

## 2020-05-27 ENCOUNTER — Other Ambulatory Visit (HOSPITAL_COMMUNITY): Payer: Self-pay | Admitting: Psychiatry

## 2020-05-27 DIAGNOSIS — F200 Paranoid schizophrenia: Secondary | ICD-10-CM

## 2020-05-27 MED ORDER — RISPERIDONE 2 MG PO TABS: 2.0000 mg | ORAL_TABLET | Freq: Two times a day (BID) | ORAL | 3 refills | Status: DC

## 2020-05-27 MED ORDER — BENZTROPINE MESYLATE 2 MG PO TABS: 2.0000 mg | ORAL_TABLET | Freq: Every day | ORAL | 3 refills | Status: DC

## 2020-05-27 NOTE — Telephone Encounter (Signed)
Patients cogentin and Risperdal was reordered. Provider unable to reorder ativan as system notes that medication is pending at preferred pharmacy.

## 2020-05-27 NOTE — Telephone Encounter (Signed)
Request from [facility] requesting RX for Ativan for patient. Checked against our chart and she should have this med for 4 months along with the other RXs generated from the agency. Called pharmacy, they have no RX or refills for any of her psych meds. Pharmacist asked writer to clarify the address, address we have was for [address] and she responded they havent been at that address for years. Will consult Eulis Canner for new RX to be called in.

## 2020-06-30 ENCOUNTER — Telehealth: Payer: Medicare HMO | Admitting: Psychiatry

## 2020-08-31 ENCOUNTER — Telehealth (INDEPENDENT_AMBULATORY_CARE_PROVIDER_SITE_OTHER): Payer: Medicare HMO | Admitting: Psychiatry

## 2020-08-31 ENCOUNTER — Other Ambulatory Visit: Payer: Self-pay

## 2020-08-31 ENCOUNTER — Encounter (HOSPITAL_COMMUNITY): Payer: Self-pay | Admitting: Psychiatry

## 2020-08-31 DIAGNOSIS — F200 Paranoid schizophrenia: Secondary | ICD-10-CM

## 2020-08-31 MED ORDER — BENZTROPINE MESYLATE 2 MG PO TABS: 2.0000 mg | ORAL_TABLET | Freq: Every day | ORAL | 5 refills | Status: DC

## 2020-08-31 MED ORDER — LORAZEPAM 0.5 MG PO TABS: 0.5000 mg | ORAL_TABLET | Freq: Two times a day (BID) | ORAL | 5 refills | Status: DC

## 2020-08-31 MED ORDER — RISPERIDONE 2 MG PO TABS: 2.0000 mg | ORAL_TABLET | Freq: Two times a day (BID) | ORAL | 5 refills | Status: DC

## 2020-08-31 NOTE — Progress Notes (Signed)
[facility] MD OP Progress Note  Virtual Visit via Video Note  I connected with Kylie Saunders on 08/31/20 at  4:40 PM EDT by a video enabled telemedicine application and verified that I am speaking with the correct person using two identifiers.  Location: Patient: Home Provider: Clinic   I discussed the limitations of evaluation and management by telemedicine and the availability of in person appointments. The patient expressed understanding and agreed to proceed.  I provided 12 minutes of non-face-to-face time during this encounter.     08/31/2020 3:37 PM Kylie Saunders  MRN:  [id number]  Chief Complaint: " I am okay, I do not like it when you change the color of the medicine."  HPI: Patient seen with the staff.  Staff and patient informed the patient is doing well.  They denied any behavioral concerns.  Patient stated that she sleeps well at night and denied any intrusive hallucinations or delusions. Patient complained about the color of the tablets and capsules that she takes to be different.  Staff stated that they have tried to explain the patient's numerous times that it is not the provider but the pharmaceutical company that provides the medications to the pharmacy that is responsible for change in the color of the medicines. Other than that patient is stable and doing well.    Visit Diagnosis:  No diagnosis found.  Past Psychiatric History: Schizophrenia  Past Medical History:  Past Medical History:  Diagnosis Date  . Acute pancreatitis   . Anxiety   . Cancer of body of uterus ([place]) 05/31/2012  . HLD (hyperlipidemia) 07/25/2012  . Hypertension   . Intellectual disability 07/25/2012  . Obesity   . Pancreatitis due to biliary obstruction   . Paranoid schizophrenia ([place])   . Right eye injury 11/03/2016    Past Surgical History:  Procedure Laterality Date  . ABDOMINAL HYSTERECTOMY    . CHOLECYSTECTOMY N/A 01/24/2017   Procedure: LAPAROSCOPIC CHOLECYSTECTOMY WITH INTRAOPERATIVE  CHOLANGIOGRAM;  Surgeon: Florene Glen, MD;  Location: [facility] ORS;  Service: General;  Laterality: N/A;  . EYE SURGERY    . TONSILLECTOMY      Family Psychiatric History: denied  Family History:  Family History  Problem Relation Age of Onset  . Diabetes Neg Hx   . Hypertension Neg Hx     Social History:  Social History   Socioeconomic History  . Marital status: Single    Spouse name: Not on file  . Number of children: Not on file  . Years of education: Not on file  . Highest education level: Not on file  Occupational History  . Not on file  Tobacco Use  . Smoking status: Never Smoker  . Smokeless tobacco: Never Used  Vaping Use  . Vaping Use: Never used  Substance and Sexual Activity  . Alcohol use: No    Alcohol/week: 0.0 standard drinks  . Drug use: No  . Sexual activity: Not Currently  Other Topics Concern  . Not on file  Social History Narrative  . Not on file   Social Determinants of Health   Financial Resource Strain:   . Difficulty of Paying Living Expenses: Not on file  Food Insecurity:   . Worried About Charity fundraiser in the Last Year: Not on file  . Ran Out of Food in the Last Year: Not on file  Transportation Needs:   . Lack of Transportation (Medical): Not on file  . Lack of Transportation (Non-Medical): Not on file  Physical Activity:   .  Days of Exercise per Week: Not on file  . Minutes of Exercise per Session: Not on file  Stress:   . Feeling of Stress : Not on file  Social Connections:   . Frequency of Communication with Friends and Family: Not on file  . Frequency of Social Gatherings with Friends and Family: Not on file  . Attends Religious Services: Not on file  . Active Member of Clubs or Organizations: Not on file  . Attends Archivist Meetings: Not on file  . Marital Status: Not on file    Allergies: No Known Allergies  Metabolic Disorder Labs: No results found for: HGBA1C, MPG No results found for:  PROLACTIN Lab Results  Component Value Date   CHOL 165 06/05/2016   TRIG 192 (H) 06/05/2016   HDL 39 (L) 06/05/2016   LDLCALC 88 06/05/2016   Lab Results  Component Value Date   TSH 2.610 06/05/2016    Therapeutic Level Labs: No results found for: LITHIUM No results found for: VALPROATE No components found for:  CBMZ  Current Medications: Current Outpatient Medications  Medication Sig Dispense Refill  . amLODipine (NORVASC) 5 MG tablet Take 5 mg by mouth daily.    . benztropine (COGENTIN) 2 MG tablet Take 1 tablet (2 mg total) by mouth daily. 30 tablet 3  . fluticasone (FLONASE) 50 MCG/ACT nasal spray Place into both nostrils daily.    Marland Kitchen lisinopril-hydrochlorothiazide (PRINZIDE,ZESTORETIC) 20-12.5 MG tablet Take 0.5 tablets by mouth daily.    Marland Kitchen loratadine (CLARITIN) 10 MG tablet Take 10 mg by mouth daily.    Marland Kitchen LORazepam (ATIVAN) 0.5 MG tablet TAKE 1 TABLET BY MOUTH TWICE DAILY SCHEDULED 60 tablet 4  . Polyvinyl Alcohol-Povidone (REFRESH OP) Apply 1 application to eye 2 (two) times daily. Right eye only    . risperiDONE (RISPERDAL) 2 MG tablet Take 1 tablet (2 mg total) by mouth 2 (two) times daily. 60 tablet 3  . simvastatin (ZOCOR) 40 MG tablet Take 40 mg by mouth daily.     Marland Kitchen White Petrolatum-Mineral Oil (EYE LUBRICANT) OINT Administer 1 application to the right eye Two (2) times a day.     No current facility-administered medications for this visit.     Musculoskeletal: Strength & Muscle Tone: unable to assess due to telemed visit Gait & Station: unable to assess due to telemed visit Patient leans: unable to assess due to telemed visit   Psychiatric Specialty Exam: Review of Systems  There were no vitals taken for this visit.There is no height or weight on file to calculate BMI.  General Appearance: Fairly Groomed  Eye Contact:  Fair  Speech:  Clear and Coherent and Normal Rate  Volume:  Normal  Mood:  Euthymic  Affect:  Restricted  Thought Process:  Goal  Directed and Descriptions of Associations: Intact  Orientation:  Full (Time, Place, and Person)  Thought Content: Logical   Suicidal Thoughts:  No  Homicidal Thoughts:  No  Memory:  Immediate;   Good Recent;   Good  Judgement:  Fair  Insight:  Fair  Psychomotor Activity:  Normal  Concentration:  Concentration: Good and Attention Span: Good  Recall:  Good  Fund of Knowledge: Fair  Language: Good  Akathisia:  Negative  Handed:  Right  AIMS (if indicated): not done  Assets:  Communication Skills Desire for Improvement Financial Resources/Insurance Housing Social Support  ADL's:  Intact  Cognition: WNL  Sleep:  Good     Assessment and Plan: Patient reports that she  is doing well on current medication regimen and is agreeable to continue.    1. Schizophrenia, paranoid ([place])  - benztropine (COGENTIN) 2 MG tablet; Take 1 tablet (2 mg total) by mouth daily.  Dispense: 30 tablet; Refill: 3 - LORazepam (ATIVAN) 0.5 MG tablet; Take 1 tablet (0.5 mg total) by mouth 2 (two) times daily.  Dispense: 60 tablet; Refill: 3 - risperiDONE (RISPERDAL) 2 MG tablet; Take 1 tablet (2 mg total) by mouth 2 (two) times daily.  Dispense: 60 tablet; Refill: 3  2. Intellectual disability   Continue same medication regimen. Follow up in 6 months.   [place] Crane, MD 08/31/2020, 3:37 PM

## 2020-09-01 ENCOUNTER — Telehealth (HOSPITAL_COMMUNITY): Payer: Medicare HMO | Admitting: Psychiatry

## 2020-12-14 ENCOUNTER — Ambulatory Visit (INDEPENDENT_AMBULATORY_CARE_PROVIDER_SITE_OTHER): Payer: Medicare HMO | Admitting: Internal Medicine

## 2020-12-14 ENCOUNTER — Other Ambulatory Visit: Payer: Self-pay

## 2020-12-14 DIAGNOSIS — Z8616 Personal history of COVID-19: Secondary | ICD-10-CM

## 2020-12-14 DIAGNOSIS — Z1152 Encounter for screening for COVID-19: Secondary | ICD-10-CM

## 2020-12-14 HISTORY — DX: Personal history of COVID-19: Z86.16

## 2020-12-14 LAB — POC COVID19 BINAXNOW: SARS Coronavirus 2 Ag: POSITIVE — AB

## 2021-02-24 ENCOUNTER — Encounter (HOSPITAL_COMMUNITY): Payer: Self-pay | Admitting: Psychiatry

## 2021-02-24 ENCOUNTER — Telehealth (INDEPENDENT_AMBULATORY_CARE_PROVIDER_SITE_OTHER): Payer: Medicare HMO | Admitting: Psychiatry

## 2021-02-24 ENCOUNTER — Other Ambulatory Visit: Payer: Self-pay

## 2021-02-24 DIAGNOSIS — F200 Paranoid schizophrenia: Secondary | ICD-10-CM | POA: Diagnosis not present

## 2021-02-24 DIAGNOSIS — F79 Unspecified intellectual disabilities: Secondary | ICD-10-CM

## 2021-02-24 MED ORDER — RISPERIDONE 2 MG PO TABS: 2.0000 mg | ORAL_TABLET | Freq: Two times a day (BID) | ORAL | 3 refills | Status: DC

## 2021-02-24 MED ORDER — LORAZEPAM 0.5 MG PO TABS: 0.5000 mg | ORAL_TABLET | Freq: Two times a day (BID) | ORAL | 3 refills | Status: DC

## 2021-02-24 MED ORDER — BENZTROPINE MESYLATE 2 MG PO TABS: 2.0000 mg | ORAL_TABLET | Freq: Every day | ORAL | 3 refills | Status: DC

## 2021-02-24 NOTE — Progress Notes (Signed)
[facility] MD OP Progress Note  Virtual Visit via Telephone Note  I connected with Kylie Saunders on 02/24/21 at  2:40 PM EDT by telephone and verified that I am speaking with the correct person using two identifiers.  Location: Patient: home Provider: Clinic   I discussed the limitations, risks, security and privacy concerns of performing an evaluation and management service by telephone and the availability of in person appointments. I also discussed with the patient that there may be a patient responsible charge related to this service. The patient expressed understanding and agreed to proceed.   I provided 16 minutes of non-face-to-face time during this encounter.      02/24/2021 1:45 PM Kylie Saunders  MRN:  [id number]  Chief Complaint: " I am fine. "  HPI: Pt was contacted on the residential facility's owner's phone.  The owner informed the patient is doing well and she denied any concerns about her. She is doing well.  She excitedly informed that she went to dollar store few days ago and then found out that the price is gone up.  She stated that she was quite upset because of that.  She stated that she loves going to dollar store but now it is not cheap anymore. She then reluctantly stated that she has a lump on her right breast.  She stated that it has been there for quite some time but she has not told anyone about it.  She stated that she was scared that if she tell someone about it and they were in a car of her breast. The residential facility owner stated that this was the first time she was hearing about it.  She informed that she recently had her seen by her primary care provider and patient never mentioned about it at that time. The owner stated that she is going to contact her PCP immediately and get him scheduled appointment for evaluation of this lump. Patient apologized to the owner and stated that she was scared and that is why she had this from her.  The owner verbalized her  understanding to her. Patient and the owner denied any other concerns.     Visit Diagnosis:    ICD-10-CM   1. Schizophrenia, paranoid ([place])  F20.0   2. Intellectual disability  F47     Past Psychiatric History: Schizophrenia  Past Medical History:  Past Medical History:  Diagnosis Date  . Acute pancreatitis   . Anxiety   . Cancer of body of uterus ([place]) 05/31/2012  . HLD (hyperlipidemia) 07/25/2012  . Hypertension   . Intellectual disability 07/25/2012  . Obesity   . Pancreatitis due to biliary obstruction   . Paranoid schizophrenia ([place])   . Right eye injury 11/03/2016    Past Surgical History:  Procedure Laterality Date  . ABDOMINAL HYSTERECTOMY    . CHOLECYSTECTOMY N/A 01/24/2017   Procedure: LAPAROSCOPIC CHOLECYSTECTOMY WITH INTRAOPERATIVE CHOLANGIOGRAM;  Surgeon: Florene Glen, MD;  Location: [facility] ORS;  Service: General;  Laterality: N/A;  . EYE SURGERY    . TONSILLECTOMY      Family Psychiatric History: denied  Family History:  Family History  Problem Relation Age of Onset  . Diabetes Neg Hx   . Hypertension Neg Hx     Social History:  Social History   Socioeconomic History  . Marital status: Single    Spouse name: Not on file  . Number of children: Not on file  . Years of education: Not on file  . Highest education  level: Not on file  Occupational History  . Not on file  Tobacco Use  . Smoking status: Never Smoker  . Smokeless tobacco: Never Used  Vaping Use  . Vaping Use: Never used  Substance and Sexual Activity  . Alcohol use: No    Alcohol/week: 0.0 standard drinks  . Drug use: No  . Sexual activity: Not Currently  Other Topics Concern  . Not on file  Social History Narrative  . Not on file   Social Determinants of Health   Financial Resource Strain: Not on file  Food Insecurity: Not on file  Transportation Needs: Not on file  Physical Activity: Not on file  Stress: Not on file  Social Connections: Not on file    Allergies: No  Known Allergies  Metabolic Disorder Labs: No results found for: HGBA1C, MPG No results found for: PROLACTIN Lab Results  Component Value Date   CHOL 165 06/05/2016   TRIG 192 (H) 06/05/2016   HDL 39 (L) 06/05/2016   [place] 88 06/05/2016   Lab Results  Component Value Date   TSH 2.610 06/05/2016    Therapeutic Level Labs: No results found for: LITHIUM No results found for: VALPROATE No components found for:  CBMZ  Current Medications: Current Outpatient Medications  Medication Sig Dispense Refill  . amLODipine (NORVASC) 5 MG tablet Take 5 mg by mouth daily.    . benztropine (COGENTIN) 2 MG tablet Take 1 tablet (2 mg total) by mouth daily. 30 tablet 5  . fluticasone (FLONASE) 50 MCG/ACT nasal spray Place into both nostrils daily.    Marland Kitchen lisinopril-hydrochlorothiazide (PRINZIDE,ZESTORETIC) 20-12.5 MG tablet Take 0.5 tablets by mouth daily.    Marland Kitchen loratadine (CLARITIN) 10 MG tablet Take 10 mg by mouth daily.    Marland Kitchen LORazepam (ATIVAN) 0.5 MG tablet Take 1 tablet (0.5 mg total) by mouth 2 (two) times daily. 60 tablet 5  . Polyvinyl Alcohol-Povidone (REFRESH OP) Apply 1 application to eye 2 (two) times daily. Right eye only    . risperiDONE (RISPERDAL) 2 MG tablet Take 1 tablet (2 mg total) by mouth 2 (two) times daily. 60 tablet 5  . simvastatin (ZOCOR) 40 MG tablet Take 40 mg by mouth daily.     Marland Kitchen White Petrolatum-Mineral Oil (EYE LUBRICANT) OINT Administer 1 application to the right eye Two (2) times a day.     No current facility-administered medications for this visit.     Musculoskeletal: Strength & Muscle Tone: unable to assess due to telemed visit Gait & Station: unable to assess due to telemed visit Patient leans: unable to assess due to telemed visit   Psychiatric Specialty Exam: Review of Systems  There were no vitals taken for this visit.There is no height or weight on file to calculate BMI.  General Appearance: Unable to assess due to phone visit  Eye Contact:   Unable to assess d/t phone visit  Speech:  Clear and Coherent and Normal Rate  Volume:  Normal  Mood:  Euthymic  Affect:  Restricted  Thought Process:  Goal Directed and Descriptions of Associations: Intact  Orientation:  Full (Time, Place, and Person)  Thought Content: Logical   Suicidal Thoughts:  No  Homicidal Thoughts:  No  Memory:  Immediate;   Good Recent;   Good  Judgement:  Poor  Insight:  Lacking  Psychomotor Activity:  Normal  Concentration:  Concentration: Good and Attention Span: Good  Recall:  Good  Fund of Knowledge: Fair  Language: Good  Akathisia:  Negative  Handed:  Right  AIMS (if indicated): not done  Assets:  Communication Skills Desire for Improvement Financial Resources/Insurance Housing Social Support  ADL's:  Intact  Cognition: Impaired,  Mild  Sleep:  Good     Assessment and Plan: Patient is stable from psychiatry standpoint however she just revealed that she has had a lump on her right breast for quite some time.  Stated this with anyone in the past because she was worried that they might cut off her right breast.  The residential facility's owner is going to contact patient's PCPs office for an appointment to evaluate this.   1. Schizophrenia, paranoid ([place])  - benztropine (COGENTIN) 2 MG tablet; Take 1 tablet (2 mg total) by mouth daily.  Dispense: 30 tablet; Refill: 3 - LORazepam (ATIVAN) 0.5 MG tablet; Take 1 tablet (0.5 mg total) by mouth 2 (two) times daily.  Dispense: 60 tablet; Refill: 3 - risperiDONE (RISPERDAL) 2 MG tablet; Take 1 tablet (2 mg total) by mouth 2 (two) times daily.  Dispense: 60 tablet; Refill: 3  2. Intellectual disability   Continue same medication regimen. Follow up in 4 months. Residential facility owner to contact patient's PCP to get evaluation of her right breast lump.  [place] Crane, MD 02/24/2021, 1:45 PM

## 2021-06-07 ENCOUNTER — Other Ambulatory Visit: Payer: Self-pay | Admitting: Family Medicine

## 2021-06-08 ENCOUNTER — Other Ambulatory Visit: Payer: Self-pay | Admitting: Family Medicine

## 2021-06-08 DIAGNOSIS — N6341 Unspecified lump in right breast, subareolar: Secondary | ICD-10-CM

## 2021-06-08 DIAGNOSIS — Z13820 Encounter for screening for osteoporosis: Secondary | ICD-10-CM

## 2021-06-13 NOTE — Progress Notes (Signed)
Virtual Visit via Telephone Note  I connected with Kylie Saunders on 06/16/21 at  3:00 PM EDT by telephone and verified that I am speaking with the correct person using two identifiers.  Location: Patient: group home Provider: office Persons participated in the visit- patient, provider    I discussed the limitations, risks, security and privacy concerns of performing an evaluation and management service by telephone and the availability of in person appointments. I also discussed with the patient that there may be a patient responsible charge related to this service. The patient expressed understanding and agreed to proceed.   I discussed the assessment and treatment plan with the patient. The patient was provided an opportunity to ask questions and all were answered. The patient agreed with the plan and demonstrated an understanding of the instructions.   The patient was advised to call back or seek an in-person evaluation if the symptoms worsen or if the condition fails to improve as anticipated.  I provided 20 minutes of non-face-to-face time during this encounter.   Kylie Clay, MD     [facility] MD/PA/NP OP Progress Note  06/16/2021 3:35 PM Kylie Saunders  MRN:  [id number]  Chief Complaint:  Chief Complaint   Follow-up; Schizophrenia    HPI:  Kylie Saunders is a 60 y.o. year old female with a history of Paranoid schizophrenia , intellectual disability , uterine cancer, hypertension, hyperlipidemia, history of pancreatitis due to biliary obstruction, who is transferred from [facility].   This is a follow-up appointment for schizophrenia.  She states that she has been doing good.  She states that the staffs at the group home are treating her very well.  She enjoys watching TV and DVD.  She also enjoys going outside at times.  She has been living in the current group home for the past two years. She moved from another group home as the owner sold the house.   She grew up in [place], and  moved with her brother, who lives in [place].  She states that she has not contacted with them.  She declined to talking details.  She has an upcoming appointment for her breast lump.  She denies any other concerns at this time.   She denies feeling depressed or anxiety.  She sleeps well.  She denies any issues with appetite.  She denies SI, HI.  She denies any hallucinations or paranoia.   Kylie Saunders, staff at group home presents to the interview.  She states that Mega is a very the client.  She is always trying to help others.  She never has any issues.  She sleeps well and eats well.  No known behavior issues.  The staff feels comfortable to stay on the medication as it is.   Medication- Risperidone 2 mg twice a day, benztropine 2 mg daily, clonazepam 0.5 mg twice a day   Daily routine: watch tv, DVD Exercise: walk at times Employment: never employed Support: talks with her mother in [place] every Sunday Household: at [facility] [facility] for 2 years (was in another group home, and the owner sold this house) Marital status: Number of children:  Education- 10 th grade, she dropped out as she did not want to go to school as she did not see the need She grew up in [place]. Her mother lives in [place]   Visit Diagnosis:    ICD-10-CM   1. Intellectual disability  F79     2. Schizophrenia, paranoid ([place])  F20.0 benztropine (COGENTIN)  2 MG tablet    LORazepam (ATIVAN) 0.5 MG tablet    risperiDONE (RISPERDAL) 2 MG tablet      Past Psychiatric History:  Outpatient:  Psychiatry admission: denies Previous suicide attempt: denies Past trials of medication:  History of violence:    Past Medical History:  Past Medical History:  Diagnosis Date   Acute pancreatitis    Anxiety    Cancer of body of uterus ([place]) 05/31/2012   HLD (hyperlipidemia) 07/25/2012   Hypertension    Intellectual disability 07/25/2012   Obesity    Pancreatitis due to biliary obstruction    Paranoid schizophrenia ([place])     Right eye injury 11/03/2016    Past Surgical History:  Procedure Laterality Date   ABDOMINAL HYSTERECTOMY     CHOLECYSTECTOMY N/A 01/24/2017   Procedure: LAPAROSCOPIC CHOLECYSTECTOMY WITH INTRAOPERATIVE CHOLANGIOGRAM;  Surgeon: Florene Glen, MD;  Location: [facility] ORS;  Service: General;  Laterality: N/A;   EYE SURGERY     TONSILLECTOMY      Family Psychiatric History: Please see initial evaluation for full details. I have reviewed the history. No updates at this time.     Family History:  Family History  Problem Relation Age of Onset   Diabetes Neg Hx    Hypertension Neg Hx     Social History:  Social History   Socioeconomic History   Marital status: Single    Spouse name: Not on file   Number of children: Not on file   Years of education: Not on file   Highest education level: Not on file  Occupational History   Not on file  Tobacco Use   Smoking status: Never   Smokeless tobacco: Never  Vaping Use   Vaping Use: Never used  Substance and Sexual Activity   Alcohol use: No    Alcohol/week: 0.0 standard drinks   Drug use: No   Sexual activity: Not Currently  Other Topics Concern   Not on file  Social History Narrative   Not on file   Social Determinants of Health   Financial Resource Strain: Not on file  Food Insecurity: Not on file  Transportation Needs: Not on file  Physical Activity: Not on file  Stress: Not on file  Social Connections: Not on file    Allergies: No Known Allergies  Metabolic Disorder Labs: No results found for: HGBA1C, MPG No results found for: PROLACTIN Lab Results  Component Value Date   CHOL 165 06/05/2016   TRIG 192 (H) 06/05/2016   HDL 39 (L) 06/05/2016   [place] 88 06/05/2016   Lab Results  Component Value Date   TSH 2.610 06/05/2016    Therapeutic Level Labs: No results found for: LITHIUM No results found for: VALPROATE No components found for:  CBMZ  Current Medications: Current Outpatient Medications   Medication Sig Dispense Refill   amLODipine (NORVASC) 5 MG tablet Take 5 mg by mouth daily.     [START ON 06/26/2021] benztropine (COGENTIN) 2 MG tablet Take 1 tablet (2 mg total) by mouth daily. 30 tablet 2   fluticasone (FLONASE) 50 MCG/ACT nasal spray Place into both nostrils daily.     lisinopril-hydrochlorothiazide (PRINZIDE,ZESTORETIC) 20-12.5 MG tablet Take 0.5 tablets by mouth daily.     loratadine (CLARITIN) 10 MG tablet Take 10 mg by mouth daily.     [START ON 06/26/2021] LORazepam (ATIVAN) 0.5 MG tablet Take 1 tablet (0.5 mg total) by mouth 2 (two) times daily. 60 tablet 2   Polyvinyl Alcohol-Povidone (REFRESH OP) Apply  1 application to eye 2 (two) times daily. Right eye only     [START ON 06/26/2021] risperiDONE (RISPERDAL) 2 MG tablet Take 1 tablet (2 mg total) by mouth 2 (two) times daily. 60 tablet 2   simvastatin (ZOCOR) 40 MG tablet Take 40 mg by mouth daily.      White Petrolatum-Mineral Oil (EYE LUBRICANT) OINT Administer 1 application to the right eye Two (2) times a day.     No current facility-administered medications for this visit.     Musculoskeletal: Strength & Muscle Tone:  N/A Gait & Station:  N/A Patient leans: N/A  Psychiatric Specialty Exam: Review of Systems  Psychiatric/Behavioral: Negative.    All other systems reviewed and are negative.  There were no vitals taken for this visit.There is no height or weight on file to calculate BMI.  General Appearance: NA  Eye Contact:  NA  Speech:  Clear and Coherent  Volume:  Normal  Mood:   good  Affect:  NA  Thought Process:  Coherent  Orientation:  Full (Time, Place, and Person)  Thought Content: Logical   Suicidal Thoughts:  No  Homicidal Thoughts:  No  Memory:  Immediate;   Good  Judgement:  Good  Insight:  Present  Psychomotor Activity:  Normal  Concentration:  Concentration: Good and Attention Span: Good  Recall:  Good  Fund of Knowledge: Good  Language: Good  Akathisia:  No  Handed:  Right  AIMS  (if indicated): not done  Assets:  Communication Skills Desire for Improvement  ADL's:  Intact  Cognition: WNL  Sleep:  Good   Screenings: PHQ2-9    Flowsheet Row Video Visit from 06/16/2021 in [place]  PHQ-2 Total Score 0      Flowsheet Row Video Visit from 06/16/2021 in [place] No Risk        Assessment and Plan:  [place] is a 60 y.o. year old female with a history of Paranoid schizophrenia , intellectual disability , uterine cancer, hypertension, hyperlipidemia, history of pancreatitis due to biliary obstruction, who is transferred from [facility].   1. Schizophrenia, paranoid ([place]) 2. Intellectual disability She denies any psychotic symptoms, and has no known behavior issues.  Will continue current dose of Risperdal to target schizophrenia.  Will continue Risperdal for EPS with plan to taper it off if able in the future.  Will continue clonazepam as needed for anxiety/irritability.  Discussed potential risk of metabolic side effect and EPS.  The staff is advised to send PCP record for collateral.   Plan Continue Risperidone 2 mg twice a day Continue benztropine 2 mg daily Continue clonazepam 0.5 mg twice a day Next appointment- 10/24 at 9:30 for 30 mins, in person - She sees Dr. Maryjane Hurter, PCP   The patient demonstrates the following risk factors for suicide: Chronic risk factors for suicide include: psychiatric disorder of intellectual disability . Acute risk factors for suicide include: N/A. Protective factors for this patient include: positive social support and hope for the future. Considering these factors, the overall suicide risk at this point appears to be low. Patient is appropriate for outpatient follow up.        Kylie Clay, MD 06/16/2021, 3:35 PM

## 2021-06-16 ENCOUNTER — Other Ambulatory Visit: Payer: Self-pay

## 2021-06-16 ENCOUNTER — Encounter: Payer: Self-pay | Admitting: Psychiatry

## 2021-06-16 ENCOUNTER — Telehealth (INDEPENDENT_AMBULATORY_CARE_PROVIDER_SITE_OTHER): Payer: Medicare HMO | Admitting: Psychiatry

## 2021-06-16 DIAGNOSIS — F200 Paranoid schizophrenia: Secondary | ICD-10-CM | POA: Diagnosis not present

## 2021-06-16 DIAGNOSIS — F79 Unspecified intellectual disabilities: Secondary | ICD-10-CM

## 2021-06-16 MED ORDER — RISPERIDONE 2 MG PO TABS: 2.0000 mg | ORAL_TABLET | Freq: Two times a day (BID) | ORAL | 2 refills | Status: DC

## 2021-06-16 MED ORDER — BENZTROPINE MESYLATE 2 MG PO TABS: 2.0000 mg | ORAL_TABLET | Freq: Every day | ORAL | 2 refills | Status: DC

## 2021-06-16 MED ORDER — LORAZEPAM 0.5 MG PO TABS: 0.5000 mg | ORAL_TABLET | Freq: Two times a day (BID) | ORAL | 2 refills | Status: AC

## 2021-06-16 NOTE — Patient Instructions (Addendum)
Continue Risperidone 2 mg twice a day Continue benztropine 2 mg daily Continue clonazepam 0.5 mg twice a day Next appointment- 10/24 at 9:30, in person  The next visit will be in person visit. Please arrive 15 mins before the scheduled time.  [facility]  Address: [address], [place], [place] [address]

## 2021-06-20 ENCOUNTER — Ambulatory Visit
Admission: RE | Admit: 2021-06-20 | Discharge: 2021-06-20 | Disposition: A | Discharge status: Home / Self Care | Payer: Medicare HMO | Source: Ambulatory Visit | Attending: Family Medicine | Admitting: Family Medicine

## 2021-06-20 ENCOUNTER — Other Ambulatory Visit: Payer: Self-pay

## 2021-06-20 DIAGNOSIS — Z13820 Encounter for screening for osteoporosis: Secondary | ICD-10-CM | POA: Diagnosis present

## 2021-06-20 DIAGNOSIS — N6341 Unspecified lump in right breast, subareolar: Secondary | ICD-10-CM

## 2021-06-20 DIAGNOSIS — R234 Changes in skin texture: Secondary | ICD-10-CM | POA: Insufficient documentation

## 2021-06-20 DIAGNOSIS — Z780 Asymptomatic menopausal state: Secondary | ICD-10-CM | POA: Insufficient documentation

## 2021-06-23 ENCOUNTER — Telehealth: Payer: Medicare HMO | Admitting: Psychiatry

## 2021-06-29 ENCOUNTER — Other Ambulatory Visit: Payer: Self-pay | Admitting: Family Medicine

## 2021-06-29 DIAGNOSIS — N6310 Unspecified lump in the right breast, unspecified quadrant: Secondary | ICD-10-CM

## 2021-06-29 DIAGNOSIS — R928 Other abnormal and inconclusive findings on diagnostic imaging of breast: Secondary | ICD-10-CM

## 2021-07-07 ENCOUNTER — Ambulatory Visit
Admission: RE | Admit: 2021-07-07 | Discharge: 2021-07-07 | Disposition: A | Discharge status: Home / Self Care | Payer: Medicare HMO | Source: Ambulatory Visit | Attending: Family Medicine | Admitting: Family Medicine

## 2021-07-07 ENCOUNTER — Other Ambulatory Visit: Payer: Self-pay

## 2021-07-07 DIAGNOSIS — R928 Other abnormal and inconclusive findings on diagnostic imaging of breast: Secondary | ICD-10-CM

## 2021-07-07 DIAGNOSIS — N6310 Unspecified lump in the right breast, unspecified quadrant: Secondary | ICD-10-CM

## 2021-07-07 HISTORY — PX: BREAST BIOPSY: SHX20

## 2021-07-11 ENCOUNTER — Encounter: Payer: Self-pay | Admitting: *Deleted

## 2021-07-11 DIAGNOSIS — C50911 Malignant neoplasm of unspecified site of right female breast: Secondary | ICD-10-CM

## 2021-07-11 NOTE — Progress Notes (Signed)
Notified by Electa Sniff, RN of [facility] that patient had been notified of her new diagnosis of breast cancer and is ready for navigation.  I called and spoke to Kylie Saunders her caregiver, and her brother Kylie Saunders, who is her legal guardian and reviewed her pathology and need a surgical consult and medical oncologist.  Informed by them that the patient would not understand and that Kylie Saunders would make decisions and consent for her care.  They requested that she stay local for care.  I have scheduled her to see Dr. Tasia Catchings for medical oncology on Monday 07/18/21 @ 11:15 and Dr. Christian Mate on 07/19/28 @ 11:30.  Brother wanted to wait until final path would be available for review at the time of her appointment.

## 2021-07-14 LAB — SURGICAL PATHOLOGY

## 2021-07-18 ENCOUNTER — Other Ambulatory Visit: Payer: Self-pay

## 2021-07-18 ENCOUNTER — Encounter (INDEPENDENT_AMBULATORY_CARE_PROVIDER_SITE_OTHER): Payer: Self-pay

## 2021-07-18 ENCOUNTER — Encounter: Payer: Self-pay | Admitting: Oncology

## 2021-07-18 ENCOUNTER — Inpatient Hospital Stay: Payer: Medicare HMO | Attending: Oncology | Admitting: Oncology

## 2021-07-18 ENCOUNTER — Inpatient Hospital Stay: Payer: Medicare HMO

## 2021-07-18 VITALS — BP 104/64 | HR 87 | Temp 97.6°F | Resp 16 | Wt 179.7 lb

## 2021-07-18 DIAGNOSIS — Z170 Estrogen receptor positive status [ER+]: Secondary | ICD-10-CM | POA: Diagnosis not present

## 2021-07-18 DIAGNOSIS — C50911 Malignant neoplasm of unspecified site of right female breast: Secondary | ICD-10-CM

## 2021-07-18 DIAGNOSIS — Z7189 Other specified counseling: Secondary | ICD-10-CM

## 2021-07-18 DIAGNOSIS — C50811 Malignant neoplasm of overlapping sites of right female breast: Secondary | ICD-10-CM | POA: Insufficient documentation

## 2021-07-18 LAB — CBC WITH DIFFERENTIAL/PLATELET
Abs Immature Granulocytes: 0.02 10*3/uL (ref 0.00–0.07)
Basophils Absolute: 0 10*3/uL (ref 0.0–0.1)
Basophils Relative: 1 %
Eosinophils Absolute: 0.3 10*3/uL (ref 0.0–0.5)
Eosinophils Relative: 3 %
HCT: 38.6 % (ref 36.0–46.0)
Hemoglobin: 12.4 g/dL (ref 12.0–15.0)
Immature Granulocytes: 0 %
Lymphocytes Relative: 21 %
Lymphs Abs: 1.8 10*3/uL (ref 0.7–4.0)
MCH: 29.2 pg (ref 26.0–34.0)
MCHC: 32.1 g/dL (ref 30.0–36.0)
MCV: 91 fL (ref 80.0–100.0)
Monocytes Absolute: 0.8 10*3/uL (ref 0.1–1.0)
Monocytes Relative: 9 %
Neutro Abs: 5.8 10*3/uL (ref 1.7–7.7)
Neutrophils Relative %: 66 %
Platelets: 220 10*3/uL (ref 150–400)
RBC: 4.24 MIL/uL (ref 3.87–5.11)
RDW: 14.1 % (ref 11.5–15.5)
WBC: 8.7 10*3/uL (ref 4.0–10.5)
nRBC: 0 % (ref 0.0–0.2)

## 2021-07-18 LAB — COMPREHENSIVE METABOLIC PANEL
ALT: 14 U/L (ref 0–44)
AST: 14 U/L — ABNORMAL LOW (ref 15–41)
Albumin: 4 g/dL (ref 3.5–5.0)
Alkaline Phosphatase: 69 U/L (ref 38–126)
Anion gap: 9 (ref 5–15)
BUN: 9 mg/dL (ref 8–23)
CO2: 31 mmol/L (ref 22–32)
Calcium: 8.8 mg/dL — ABNORMAL LOW (ref 8.9–10.3)
Chloride: 97 mmol/L — ABNORMAL LOW (ref 98–111)
Creatinine, Ser: 0.83 mg/dL (ref 0.44–1.00)
GFR, Estimated: >60 mL/min (ref >60)
Glucose, Bld: 100 mg/dL — ABNORMAL HIGH (ref 70–99)
Potassium: 3.7 mmol/L (ref 3.5–5.1)
Sodium: 137 mmol/L (ref 135–145)
Total Bilirubin: 0.4 mg/dL (ref 0.3–1.2)
Total Protein: 7.6 g/dL (ref 6.5–8.1)

## 2021-07-18 NOTE — Progress Notes (Signed)
Hematology/Oncology Consult note [facility] [phone] [phone]   Patient Care Team: Novella Rob, FNP as PCP - General (Family Medicine)  REFERRING PROVIDER: Novella Rob, FNP  CHIEF COMPLAINTS/REASON FOR VISIT:  Evaluation of right breast cancer  HISTORY OF PRESENTING ILLNESS:   Kylie Saunders is a  60 y.o.  female with PMH listed below was seen in consultation at the request of  Novella Rob, FNP  for evaluation of right breast cancer.   Patient lives in a group home. Accompanied by group home staff Rogers,Dorothea. Mr.Patrick Leonetti is her legal guardian.  Patient felt right breast mass last year and initially she did not tell anyone until recently, she told her group home staff.  06/20/2021 bilateral diagnostic breast mammogram showed medial right breast highly suspicious 1.3cm with overlying skin dimpling and skin thickening concerning for liver skin involvement.  Normal right axillary ultrasound. No mass in left breast.   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  ER 90%, PR 90%, HER2 negative.  Patient denies any family history of breast cancer. Patient has a history of uterus cancer, status post hysterectomy.  Details unknown. She has paranoid schizophrenia and intellectual disability.  Patient follows up with psychiatry.  Review of Systems  Unable to perform ROS: Other (Schizophrenia and intellectual disability.)  Constitutional:  Negative for fatigue.  Respiratory:  Negative for cough and shortness of breath.   Gastrointestinal:  Negative for abdominal distention and abdominal pain.  Neurological:  Negative for headaches and light-headedness.  Hematological:  Negative for adenopathy.  Psychiatric/Behavioral:  Negative for confusion.    MEDICAL HISTORY:  Past Medical History:  Diagnosis Date   Acute  pancreatitis    Anxiety    Cancer of body of uterus ([place]) 05/31/2012   HLD (hyperlipidemia) 07/25/2012   Hypertension    Intellectual disability 07/25/2012   Obesity    Pancreatitis due to biliary obstruction    Paranoid schizophrenia ([place])    Right eye injury 11/03/2016    SURGICAL HISTORY: Past Surgical History:  Procedure Laterality Date   ABDOMINAL HYSTERECTOMY     BREAST BIOPSY Right 07/07/2021   [place] bx mass, venus marker, path pending   CHOLECYSTECTOMY N/A 01/24/2017   Procedure: LAPAROSCOPIC CHOLECYSTECTOMY WITH INTRAOPERATIVE CHOLANGIOGRAM;  Surgeon: Florene Glen, MD;  Location: [facility] ORS;  Service: General;  Laterality: N/A;   EYE SURGERY     TONSILLECTOMY      SOCIAL HISTORY: Social History   Socioeconomic History   Marital status: Single    Spouse name: Not on file   Number of children: Not on file   Years of education: Not on file   Highest education level: Not on file  Occupational History   Not on file  Tobacco Use   Smoking status: Never   Smokeless tobacco: Never  Vaping Use   Vaping Use: Never used  Substance and Sexual Activity   Alcohol use: No    Alcohol/week: 0.0 standard drinks   Drug use: No   Sexual activity: Not Currently  Other Topics Concern   Not on file  Social History Narrative   Not on file   Social Determinants of Health   Financial Resource Strain: Not on file  Food Insecurity: Not on file  Transportation Needs: Not on file  Physical Activity: Not on file  Stress: Not on file  Social Connections: Not on file  Intimate Partner Violence: Not  on file    FAMILY HISTORY: Family History  Problem Relation Age of Onset   Diabetes Neg Hx    Hypertension Neg Hx     ALLERGIES:  has No Known Allergies.  MEDICATIONS:  Current Outpatient Medications  Medication Sig Dispense Refill   amLODipine (NORVASC) 5 MG tablet Take 5 mg by mouth daily.     aspirin 81 MG chewable tablet CHEW ONE TABLET BY MOUTH EVERY DAY     benztropine  (COGENTIN) 2 MG tablet Take 1 tablet (2 mg total) by mouth daily. 30 tablet 2   lisinopril-hydrochlorothiazide (PRINZIDE,ZESTORETIC) 20-12.5 MG tablet Take 0.5 tablets by mouth daily.     loratadine (CLARITIN) 10 MG tablet Take 10 mg by mouth daily.     LORazepam (ATIVAN) 0.5 MG tablet Take 1 tablet (0.5 mg total) by mouth 2 (two) times daily. 60 tablet 2   omeprazole (PRILOSEC) 40 MG capsule Take 40 mg by mouth daily.     Polyvinyl Alcohol-Povidone (REFRESH OP) Apply 1 application to eye 2 (two) times daily. Right eye only     risperiDONE (RISPERDAL) 2 MG tablet Take 1 tablet (2 mg total) by mouth 2 (two) times daily. 60 tablet 2   simvastatin (ZOCOR) 40 MG tablet Take 40 mg by mouth daily.      thiothixene (NAVANE) 10 MG capsule Take by mouth.     White Petrolatum-Mineral Oil (EYE LUBRICANT) OINT Administer 1 application to the right eye Two (2) times a day.     acetaminophen (TYLENOL) 325 MG tablet Take by mouth. (Patient not taking: Reported on 07/18/2021)     fluticasone (FLONASE) 50 MCG/ACT nasal spray Place into both nostrils daily. (Patient not taking: Reported on 07/18/2021)     No current facility-administered medications for this visit.     PHYSICAL EXAMINATION: ECOG PERFORMANCE STATUS: 1 - Symptomatic but completely ambulatory Vitals:   07/18/21 1142  BP: 104/64  Pulse: 87  Resp: 16  Temp: 97.6 F (36.4 C)  SpO2: 96%   Filed Weights   07/18/21 1142  Weight: 179 lb 11.2 oz (81.5 kg)    Physical Exam Constitutional:      General: She is not in acute distress. HENT:     Head: Normocephalic and atraumatic.  Eyes:     General: No scleral icterus. Cardiovascular:     Rate and Rhythm: Normal rate and regular rhythm.     Heart sounds: Normal heart sounds.  Pulmonary:     Effort: Pulmonary effort is normal. No respiratory distress.     Breath sounds: No wheezing.  Abdominal:     General: Bowel sounds are normal. There is no distension.     Palpations: Abdomen is soft.   Musculoskeletal:        General: No deformity. Normal range of motion.     Cervical back: Normal range of motion and neck supple.  Skin:    General: Skin is warm and dry.     Findings: No erythema or rash.  Neurological:     Mental Status: She is alert and oriented to person, place, and time. Mental status is at baseline.     Cranial Nerves: No cranial nerve deficit.     Coordination: Coordination normal.   Breast exam was performed in seated and lying down position. Right breast lower inner quadrant,  about 2cm palpable mass, focal skin dimpling "peau d'orange'' , < 1/3 of her right breast, no erythematous changes, nipple discharge. no mass in left breast.  No evidence  of axillary adenopathy bilaterally.   LABORATORY DATA:  I have reviewed the data as listed Lab Results  Component Value Date   WBC 8.7 07/18/2021   HGB 12.4 07/18/2021   HCT 38.6 07/18/2021   MCV 91.0 07/18/2021   PLT 220 07/18/2021   Recent Labs    07/18/21 1219  NA 137  K 3.7  CL 97*  CO2 31  GLUCOSE 100*  BUN 9  CREATININE 0.83  CALCIUM 8.8*  GFRNONAA >60  PROT 7.6  ALBUMIN 4.0  AST 14*  ALT 14  ALKPHOS 69  BILITOT 0.4   Iron/TIBC/Ferritin/ %Sat No results found for: IRON, TIBC, FERRITIN, IRONPCTSAT    RADIOGRAPHIC STUDIES: I have personally reviewed the radiological images as listed and agreed with the findings in the report. DG BONE DENSITY (DXA)  Result Date: 06/20/2021 EXAM: DUAL X-RAY ABSORPTIOMETRY (DXA) FOR BONE MINERAL DENSITY IMPRESSION: Your patient Kylie Saunders completed a BMD test on 06/20/2021 using the [place] (software version: 14.10) manufactured by [employer]. The following summarizes the results of our evaluation. Technologist: MTB PATIENT BIOGRAPHICAL: Name: Kylie, Saunders Patient ID: [id number] Birth Date: [birth year] Height: 62.0 in. Gender: Female Exam Date: 06/20/2021 Weight: 199.6 lbs. Indications: Caucasian, Hysterectomy, Postmenopausal  Fractures: Treatments: DENSITOMETRY RESULTS: Site         Region     Measured Date Measured Age WHO Classification Young Adult T-score BMD         %Change vs. Previous Significant Change (*) AP Spine L2-L4 (L3) 4/20/2019 62.2 Normal 3.0 1.594 g/cm2 - - DualFemur Neck Right 06/20/2021 62.2 Normal 1.5 1.242 g/cm2 - - DualFemur Total Mean 06/20/2021 62.2 Normal 2.4 1.315 g/cm2 - - Left Forearm Radius 33% 06[age]20/2021 62.2 Normal 0.3 0.898 g/cm2 - - ASSESSMENT: The BMD measured at Forearm Radius 33% is 0.898 g/cm2 with a T-score of 0.3. This patient is considered normal according to [facility]) criteria. The scan quality is good. L-1 and L-3 were excluded due to degenerative changes. [facility] Pharmacologist [facility]) criteria for post-menopausal, Caucasian Women: Normal:                   T-score at or above -1 SD Osteopenia/low bone mass: T-score between -1 and -2.5 SD Osteoporosis:             T-score at or below -2.5 SD RECOMMENDATIONS: 1. All patients should optimize calcium and vitamin D intake. 2. Consider FDA-approved medical therapies in postmenopausal women and men aged 81 years and older, based on the following: a. A hip or vertebral(clinical or morphometric) fracture b. T-score < -2.5 at the femoral neck or spine after appropriate evaluation to exclude secondary causes c. Low bone mass (T-score between -1.0 and -2.5 at the femoral neck or spine) and a 10-year probability of a hip fracture > 3% or a 10-year probability of a major osteoporosis-related fracture > 20% based on the [place]-adapted WHO algorithm 3. Clinician judgment and/or patient preferences may indicate treatment for people with 10-year fracture probabilities above or below these levels FOLLOW-UP: People with diagnosed cases of osteoporosis or at high risk for fracture should have regular bone mineral density tests. For patients eligible for Medicare, routine testing is allowed once every 2 years. The testing frequency can be  increased to one year for patients who have rapidly progressing disease, those who are receiving or discontinuing medical therapy to restore bone mass, or have additional risk factors. I have reviewed this report, and agree with the  above findings. [facility] Electronically Signed   By: Rolm Baptise M.D.   On: 06/20/2021 15:35   US BREAST LTD UNI RIGHT INC AXILLA  Result Date: 06/20/2021 CLINICAL DATA:  60 year old female presenting for evaluation of a right breast lump. Patient presents with her caregiver, she lives in a group home. Patient reports the mass in the right breast has been present for approximately 1 year. EXAM: DIGITAL DIAGNOSTIC BILATERAL MAMMOGRAM WITH TOMOSYNTHESIS AND CAD; ULTRASOUND RIGHT BREAST LIMITED TECHNIQUE: Bilateral digital diagnostic mammography and breast tomosynthesis was performed. The images were evaluated with computer-aided detection.; Targeted ultrasound examination of the right breast was performed COMPARISON:  Previous exam(s). ACR Breast Density Category c: The breast tissue is heterogeneously dense, which may obscure small masses. FINDINGS: Mammogram: Right breast: A skin BB marks the site of concern reported by the patient in the lower inner right breast. A spot tangential view of this area is performed in addition to standard views. At the palpable site there is an irregular spiculated mass with distortion measuring approximately 1.4 cm. There is evidence of skin retraction. Left breast: No suspicious mass, distortion, or microcalcifications are identified to suggest presence of malignancy. On physical exam at the site of concern in the lower inner right breast there is skin dimpling. No appreciable erythema or discoloration. There is a fixed discrete mass. Ultrasound: Targeted ultrasound performed at the palpable site in the right breast at 3 o'clock 1 cm from the nipple demonstrating an irregular hypoechoic mass measuring 1.2 x 1.3 x 1.1 cm. There is  extension to the overlying skin. There is mild overlying skin thickening. Targeted ultrasound the right axilla demonstrates normal lymph nodes. IMPRESSION: 1. At the palpable site of concern in the medial right breast there is a highly suspicious mass measuring 1.3 cm with overlying skin dimpling and skin thickening concerning for skin involvement. 2.  Normal right axillary ultrasound. 3.  No mammographic evidence of malignancy in the left breast. RECOMMENDATION: Ultrasound-guided core needle biopsy of the right breast mass at 3 o'clock. I have discussed the findings and recommendations with the patient and her caregiver who agree to proceed with biopsy. The patient will be contacted by our scheduler to arrange the biopsy appointment. BI-RADS CATEGORY  5: Highly suggestive of malignancy. Electronically Signed   By: Audie Pinto M.D.   On: 06/20/2021 15:04  MM DIAG BREAST TOMO BILATERAL  Result Date: 06/20/2021 CLINICAL DATA:  60 year old female presenting for evaluation of a right breast lump. Patient presents with her caregiver, she lives in a group home. Patient reports the mass in the right breast has been present for approximately 1 year. EXAM: DIGITAL DIAGNOSTIC BILATERAL MAMMOGRAM WITH TOMOSYNTHESIS AND CAD; ULTRASOUND RIGHT BREAST LIMITED TECHNIQUE: Bilateral digital diagnostic mammography and breast tomosynthesis was performed. The images were evaluated with computer-aided detection.; Targeted ultrasound examination of the right breast was performed COMPARISON:  Previous exam(s). ACR Breast Density Category c: The breast tissue is heterogeneously dense, which may obscure small masses. FINDINGS: Mammogram: Right breast: A skin BB marks the site of concern reported by the patient in the lower inner right breast. A spot tangential view of this area is performed in addition to standard views. At the palpable site there is an irregular spiculated mass with distortion measuring approximately 1.4 cm. There  is evidence of skin retraction. Left breast: No suspicious mass, distortion, or microcalcifications are identified to suggest presence of malignancy. On physical exam at the site of concern in the lower inner right breast  there is skin dimpling. No appreciable erythema or discoloration. There is a fixed discrete mass. Ultrasound: Targeted ultrasound performed at the palpable site in the right breast at 3 o'clock 1 cm from the nipple demonstrating an irregular hypoechoic mass measuring 1.2 x 1.3 x 1.1 cm. There is extension to the overlying skin. There is mild overlying skin thickening. Targeted ultrasound the right axilla demonstrates normal lymph nodes. IMPRESSION: 1. At the palpable site of concern in the medial right breast there is a highly suspicious mass measuring 1.3 cm with overlying skin dimpling and skin thickening concerning for skin involvement. 2.  Normal right axillary ultrasound. 3.  No mammographic evidence of malignancy in the left breast. RECOMMENDATION: Ultrasound-guided core needle biopsy of the right breast mass at 3 o'clock. I have discussed the findings and recommendations with the patient and her caregiver who agree to proceed with biopsy. The patient will be contacted by our scheduler to arrange the biopsy appointment. BI-RADS CATEGORY  5: Highly suggestive of malignancy. Electronically Signed   By: Audie Pinto M.D.   On: 06/20/2021 15:04  MM CLIP PLACEMENT RIGHT  Result Date: 07/07/2021 CLINICAL DATA:  Status post ultrasound-guided core biopsy of right breast mass EXAM: 3D DIAGNOSTIC right MAMMOGRAM POST ultrasound BIOPSY COMPARISON:  Prior films FINDINGS: 3D Mammographic images were obtained following ultrasound guided biopsy of mass at right breast 3 o'clock. The biopsy marking clip is in expected position at the site of biopsy. IMPRESSION: Appropriate positioning of the venous shaped biopsy marking clip at the site of biopsy in the mass of concern. Final Assessment: Post  Procedure Mammograms for Marker Placement Electronically Signed   By: Abelardo Diesel M.D.   On: 07/07/2021 13:46  [place] RT BREAST BX W LOC DEV 1ST LESION IMG BX SPEC US GUIDE  Addendum Date: 07/08/2021   ADDENDUM REPORT: 07/08/2021 11:44 ADDENDUM: PATHOLOGY revealed: A. BREAST, RIGHT AT 3:00, 1 CM FROM THE NIPPLE; ULTRASOUND-GUIDED CORE NEEDLE BIOPSY: - INVASIVE MAMMARY CARCINOMA, NO SPECIAL TYPE. Size of invasive carcinoma: 13 mm in this sample. Grade 3. Ductal carcinoma in situ: Not identified. Lymphovascular invasion: Not identified. Pathology results are CONCORDANT with imaging findings, per Dr. Abelardo Diesel. Pathology results and recommendations were discussed with patient via telephone on 07/08/2021. Patient reported doing well after the biopsy with no adverse symptoms, and only slight tenderness at the site. Post biopsy care instructions were reviewed, questions were answered and my direct phone number was provided. Patient was instructed to call [facility] for any additional questions or concerns related to biopsy site. Recommend surgical consultation: Request for surgical consultation relayed to Al Pimple RN and Tanya Nones RN at [facility] by Electa Sniff RN on 07/08/2021. Pathology results reported by Electa Sniff RN on 07/08/2021. Electronically Signed   By: Abelardo Diesel M.D.   On: 07/08/2021 11:44   Result Date: 07/08/2021 CLINICAL DATA:  RIGHT BREAST MASS FOR BIOPSY EXAM: ULTRASOUND GUIDED RIGHT BREAST CORE NEEDLE BIOPSY COMPARISON:  Previous exam(s). PROCEDURE: I met with the patient and we discussed the procedure of ultrasound-guided biopsy, including benefits and alternatives. We discussed the high likelihood of a successful procedure. We discussed the risks of the procedure, including infection, bleeding, tissue injury, clip migration, and inadequate sampling. Informed written consent was given. The usual time-out protocol was performed immediately prior to the  procedure. Lesion quadrant: RIGHT BREAST 3 O'CLOCK Using sterile technique and 1% Lidocaine as local anesthetic, under direct ultrasound visualization, a 12 gauge spring-loaded device was used to  perform biopsy of mass at right breast 3 o'clock using a medial approach. At the conclusion of the procedure venus tissue marker clip was deployed into the biopsy cavity. Follow up 2 view mammogram was performed and dictated separately. IMPRESSION: Ultrasound guided biopsy of right breast. No apparent complications. Electronically Signed: By: Abelardo Diesel M.D. On: 07/07/2021 13:37      ASSESSMENT & PLAN:  1. Malignant neoplasm of overlapping sites of right breast in female, estrogen receptor positive ([place])   2. Goals of care, counseling/discussion   Cancer Staging Malignant neoplasm of overlapping sites of right breast in female, estrogen receptor positive ([place]) Staging form: Breast, AJCC 8th Edition - Clinical stage from 07/18/2021: Stage IIIB (cT4b, cN0, cM0, G3, ER+, PR+, HER2-) - Signed by Earlie Server, MD on 07/18/2021   Right invasive breast carcinoma, ER+. PR+, HER2- Clinically she has some peau d'orange changes, indicating Skin involvement- ?T4b lesion.  Recommend skin biopsy.  Mostly like she will benefit from chemotherapy before or after surgery.  It will be challenging due to her underlying schizophrenia and intelligence disability. Discussed her case with her legal guardian Saralyn Pilar who is interested in chemotherapy option and would like another discussion after his case is discussed. If she will need mastectomy anyway, then I favor surgery first, followed by chemotherapy.  She has surgery appointment this week and I will further discuss with surgeon and present her case on breast tumor board. I will send oncotype Dx,   Orders Placed This Encounter  Procedures   CBC with Differential/Platelet    Standing Status:   Future    Number of Occurrences:   1    Standing Expiration Date:   07/18/2022    Comprehensive metabolic panel    Standing Status:   Future    Number of Occurrences:   1    Standing Expiration Date:   07/18/2022   Cancer antigen 27.29    Standing Status:   Future    Number of Occurrences:   1    Standing Expiration Date:   07/18/2022   Cancer antigen 15-3    Standing Status:   Future    Number of Occurrences:   1    Standing Expiration Date:   07/18/2022    All questions were answered. The patient knows to call the clinic with any problems questions or concerns.  cc Novella Rob, FNP    Return of visit: 2 weeks to go over tumor board recommendation.  Thank you for this kind referral and the opportunity to participate in the care of this patient. A copy of today's note is routed to referring provider    Earlie Server, MD, PhD Hematology Oncology [facility] at [facility] Pager- [phone] 07/18/2021

## 2021-07-19 ENCOUNTER — Ambulatory Visit: Payer: Medicare HMO | Admitting: Surgery

## 2021-07-19 LAB — CANCER ANTIGEN 15-3: CA 15-3: 19.8 U/mL (ref 0.0–25.0)

## 2021-07-19 LAB — CANCER ANTIGEN 27.29: CA 27.29: 21.6 U/mL (ref 0.0–38.6)

## 2021-07-21 ENCOUNTER — Telehealth: Payer: Self-pay | Admitting: Surgery

## 2021-07-21 ENCOUNTER — Encounter: Payer: Self-pay | Admitting: Surgery

## 2021-07-21 ENCOUNTER — Ambulatory Visit (INDEPENDENT_AMBULATORY_CARE_PROVIDER_SITE_OTHER): Payer: Medicare HMO | Admitting: Surgery

## 2021-07-21 ENCOUNTER — Other Ambulatory Visit: Payer: Self-pay

## 2021-07-21 VITALS — BP 114/78 | HR 94 | Temp 98.9°F | Wt 178.8 lb

## 2021-07-21 DIAGNOSIS — C50811 Malignant neoplasm of overlapping sites of right female breast: Secondary | ICD-10-CM

## 2021-07-21 DIAGNOSIS — Z170 Estrogen receptor positive status [ER+]: Secondary | ICD-10-CM | POA: Diagnosis not present

## 2021-07-21 NOTE — H&P (View-Only) (Signed)
Patient ID: Kylie Saunders, female   DOB: 06/03/1961, 60 y.o.   MRN: [id number]  Chief Complaint: Right breast cancer  History of Present Illness Kylie Saunders is a 60 y.o. female with a year history of right breast mass.  She lives in a group home due to the issues noted in her past medical history below. Mass has progressed, she obtained a percutaneous biopsy confirming malignancy.  Ultrasound of the right axilla did not show any lymphadenopathy.  No appreciable disease on imaging in the left breast.  There appears to be some dermal involvement.  We spent an extensive period of time discussing her risks in compromising her cancer treatment, along with her unwillingness to allow her breast to be removed.  Her brother Saralyn Pilar was available by phone as well as her associated case [employer]. She reports she has never been pregnant, and began menstruating at the age of 41.  She denies any family history of breast cancer.  She underwent a hysterectomy in 2013.  She never utilized birth control or hormonal therapy.  She denies any history of pain. 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   Past Medical History Past Medical History:  Diagnosis Date   Acute pancreatitis    Anxiety    Cancer of body of uterus ([place]) 05/31/2012   Endometrial cancer, had hysterectomy w/BSO 07/25/12   HLD (hyperlipidemia) 07/25/2012   Hypertension    Intellectual disability 07/25/2012   Obesity    Pancreatitis due to biliary obstruction    Paranoid schizophrenia ([place])    Right eye injury 11/03/2016      Past Surgical History:  Procedure Laterality Date   ABDOMINAL HYSTERECTOMY  07/25/2012   with BSO   BREAST BIOPSY Right 07/07/2021   [place] bx mass, venus marker, path pending   CHOLECYSTECTOMY N/A 01/24/2017   Procedure: LAPAROSCOPIC CHOLECYSTECTOMY WITH INTRAOPERATIVE CHOLANGIOGRAM;  Surgeon: Florene Glen, MD;  Location: [facility] ORS;  Service: General;   Laterality: N/A;   EYE SURGERY     TONSILLECTOMY      No Known Allergies  Current Outpatient Medications  Medication Sig Dispense Refill   amLODipine (NORVASC) 5 MG tablet Take 5 mg by mouth daily.     aspirin 81 MG chewable tablet CHEW ONE TABLET BY MOUTH EVERY DAY     benztropine (COGENTIN) 2 MG tablet Take 1 tablet (2 mg total) by mouth daily. 30 tablet 2   lisinopril-hydrochlorothiazide (PRINZIDE,ZESTORETIC) 20-12.5 MG tablet Take 0.5 tablets by mouth daily.     loratadine (CLARITIN) 10 MG tablet Take 10 mg by mouth daily.     LORazepam (ATIVAN) 0.5 MG tablet Take 1 tablet (0.5 mg total) by mouth 2 (two) times daily. 60 tablet 2   omeprazole (PRILOSEC) 40 MG capsule Take 40 mg by mouth daily.     Polyvinyl Alcohol-Povidone (REFRESH OP) Apply 1 application to eye 2 (two) times daily. Right eye only     risperiDONE (RISPERDAL) 2 MG tablet Take 1 tablet (2 mg total) by mouth 2 (two) times daily. 60 tablet 2   simvastatin (ZOCOR) 40 MG tablet Take 40 mg by mouth daily.      No current facility-administered medications for this visit.    Family History Family History  Problem Relation Age of Onset   Diabetes Neg Hx    Hypertension Neg Hx       Social History Social History   Tobacco Use   Smoking status: Never  Smokeless tobacco: Never  Vaping Use   Vaping Use: Never used  Substance Use Topics   Alcohol use: No    Alcohol/week: 0.0 standard drinks   Drug use: No        Review of Systems  Constitutional: Negative.   HENT: Negative.    Eyes: Negative.   Respiratory: Negative.    Cardiovascular: Negative.   Gastrointestinal: Negative.   Genitourinary: Negative.   Skin:  Positive for itching (She picks at the skin).  Neurological: Negative.   Psychiatric/Behavioral:  Positive for hallucinations (Paranoid schizophrenia,) and memory loss (Cognitive disability). The patient is nervous/anxious.   All other systems reviewed and are negative.    Physical Exam Blood  pressure 114/78, pulse 94, temperature 98.9 F (37.2 C), weight 178 lb 12.8 oz (81.1 kg), SpO2 95 %. Last Weight  Most recent update: 07/21/2021  2:36 PM    Weight  81.1 kg (178 lb 12.8 oz)             CONSTITUTIONAL: Well developed, and nourished, appropriately responsive and aware without distress.  Obviously quite anxious, and somewhat emotional when discussing the need to remove the breast in order to provide adequate cancer treatment, and hopefully life lengthening treatment. EYES: Sclera non-icteric.  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 EARS, NOSE, MOUTH AND THROAT: Mask worn.   The oropharynx is clear. Oral mucosa is pink and moist.   Hearing is intact to voice.  NECK: Trachea is midline, and there is no jugular venous distension.  LYMPH NODES:  Lymph nodes in the neck are not enlarged. RESPIRATORY:  Lungs are clear, and breath sounds are equal bilaterally. Normal respiratory effort without pathologic use of accessory muscles. CARDIOVASCULAR: Heart is regular in rate and rhythm. GI: The abdomen is soft, nontender, and nondistended.  Well rounded with prior laparoscopic scars. GU: Breast exam is notable for a vague density of the medial inferior right breast, some dermal changes and dimpling in the vicinity approaching the nipple areolar complex.  The skin changes do not appear consistent with inflammatory cancer, there is no pink hue and there is no remarkable edema associated.  The mass does not feel fixed to the underlying tissues.  No appreciable palpable axillary adenopathy present.  The left breast is unremarkable, no suspicious or dominant nodularity whatsoever. MUSCULOSKELETAL:  Symmetrical muscle tone appreciated in all four extremities.    SKIN: Skin turgor is normal. No pathologic skin lesions appreciated.  NEUROLOGIC:  Motor and sensation appear grossly normal.  Cranial nerves are grossly without defect. PSYCH:  Alert and oriented to person, place and time.  Affect is appropriate for situation.  Data Reviewed I have personally reviewed what is currently available of the patient's imaging, recent labs and medical records.   Labs:  CBC Latest Ref Rng & Units 07/18/2021 07/07/2018 01/25/2017  WBC 4.0 - 10.5 K/uL 8.7 8.7 8.6  Hemoglobin 12.0 - 15.0 g/dL 12.4 13.2 12.5  Hematocrit 36.0 - 46.0 % 38.6 38.2 37.7  Platelets 150 - 400 K/uL 220 230 208   CMP Latest Ref Rng & Units 07/18/2021 07/07/2018 01/25/2017  Glucose 70 - 99 mg/dL 100(H) 105(H) 136(H)  BUN 8 - 23 mg/dL $Remove'9 16 10  'qEZxzOt$ Creatinine 0.44 - 1.00 mg/dL 0.83 0.93 0.87  Sodium 135 - 145 mmol/L 137 139 135  Potassium 3.5 - 5.1 mmol/L 3.7 3.7 3.9  Chloride 98 - 111 mmol/L 97(L) 102 103  CO2 22 - 32 mmol/L $RemoveB'31 28 25  'FKPRKRDB$ Calcium 8.9 -  10.3 mg/dL 8.8(L) 8.6(L) 9.0  Total Protein 6.5 - 8.1 g/dL 7.6 7.1 7.1  Total Bilirubin 0.3 - 1.2 mg/dL 0.4 0.4 0.3  Alkaline Phos 38 - 126 U/L 69 68 61  AST 15 - 41 U/L 14(L) 14(L) 26  ALT 0 - 44 U/L $Remo'14 15 27   'RWXKj$ SURGICAL PATHOLOGY   * THIS IS AN ADDENDUM REPORT *  CASE: [phone]  PATIENT: [facility]  Surgical Pathology Report  *Addendum *   Reason for Addendum #1:  Breast Biomarker Results   Specimen Submitted:  A. Breast, right   Clinical History: Right Br 3 oc mass - expect cancer. Venus tissue  marker clip placed following ultrasound guided core needle biopsy of  RIGHT breast at 3 o'clock.     DIAGNOSIS:  A. BREAST, RIGHT AT 3:00, 1 CM FROM THE NIPPLE; ULTRASOUND-GUIDED CORE  NEEDLE BIOPSY:  - INVASIVE MAMMARY CARCINOMA, NO SPECIAL TYPE.   Size of invasive carcinoma: 13 mm in this sample  Histologic grade of invasive carcinoma: Grade 3                       Glandular/tubular differentiation score: 3                       Nuclear pleomorphism score: 2                       Mitotic rate score: 3                       Total score: 8  Ductal carcinoma in situ: Not identified  Lymphovascular invasion: Not identified   ER/PR/HER2:  Immunohistochemistry will be performed on block A1, with  reflex to [place] for HER2 2+. The results will be reported in an addendum.   GROSS DESCRIPTION:  A. Labeled: Right breast 3:00 1 cm from nipple  Received: Formalin  Time/date in fixative: Collected and placed in formalin at 1:25 PM on  07/07/2021  Cold ischemic time: Less than 1 minute  Total fixation time: Approximately 6.5 hours  Core pieces: 3 cores and 2 additional fragments  Size: Range from 0.5-2.2 cm in length and range from 0.2-0.3 cm in  diameter  Description: Received are cores and fragments of yellow and white  fibrofatty tissue.  The 2 additional fragments range from 0.4 to 0.5 cm  in greatest dimension.  Ink color: Black  Entirely submitted in cassettes 1-2 with 2 cores in cassette 1 and 1  core with the remaining fragments in cassette 2.   RB 07/07/2021     Final Diagnosis performed by Allena Napoleon, MD.   Electronically signed  07/08/2021 10:44:53AM  The electronic signature indicates that the named Attending Pathologist  has evaluated the specimen  Technical component performed at [facility], [address], [place],  [place] [address] Lab: [phone] Dir: Rush Farmer, MD, MMM   Professional component performed at [facility], [facility], [place], [place],  [address] Lab: [phone]  Dir: Dellia Nims. Rubinas, MD   ADDENDUM:  CASE SUMMARY: BREAST BIOMARKER TESTS  Estrogen Receptor (ER) Status: POSITIVE          Percentage of cells with nuclear positivity: >90%          Average intensity of staining: Strong   Progesterone Receptor (PgR) Status: POSITIVE          Percentage of cells with nuclear positivity: >90%  Average intensity of staining: Strong   HER2 (by immunohistochemistry): NEGATIVE (Score 0)   Cold Ischemia and Fixation Times: Meet requirements specified in latest  version of the ASCO/CAP guidelines     Imaging: Radiology review:  CLINICAL DATA:   60 year old female presenting for evaluation of a right breast lump. Patient presents with her caregiver, she lives in a group home. Patient reports the mass in the right breast has been present for approximately 1 year.   EXAM: DIGITAL DIAGNOSTIC BILATERAL MAMMOGRAM WITH TOMOSYNTHESIS AND CAD; ULTRASOUND RIGHT BREAST LIMITED   TECHNIQUE: Bilateral digital diagnostic mammography and breast tomosynthesis was performed. The images were evaluated with computer-aided detection.; Targeted ultrasound examination of the right breast was performed   COMPARISON:  Previous exam(s).   ACR Breast Density Category c: The breast tissue is heterogeneously dense, which may obscure small masses.   FINDINGS: Mammogram:   Right breast: A skin BB marks the site of concern reported by the patient in the lower inner right breast. A spot tangential view of this area is performed in addition to standard views. At the palpable site there is an irregular spiculated mass with distortion measuring approximately 1.4 cm. There is evidence of skin retraction.   Left breast: No suspicious mass, distortion, or microcalcifications are identified to suggest presence of malignancy.   On physical exam at the site of concern in the lower inner right breast there is skin dimpling. No appreciable erythema or discoloration. There is a fixed discrete mass.   Ultrasound:   Targeted ultrasound performed at the palpable site in the right breast at 3 o'clock 1 cm from the nipple demonstrating an irregular hypoechoic mass measuring 1.2 x 1.3 x 1.1 cm. There is extension to the overlying skin. There is mild overlying skin thickening.   Targeted ultrasound the right axilla demonstrates normal lymph nodes.   IMPRESSION: 1. At the palpable site of concern in the medial right breast there is a highly suspicious mass measuring 1.3 cm with overlying skin dimpling and skin thickening concerning for skin involvement.    2.  Normal right axillary ultrasound.   3.  No mammographic evidence of malignancy in the left breast.   RECOMMENDATION: Ultrasound-guided core needle biopsy of the right breast mass at 3 o'clock.   I have discussed the findings and recommendations with the patient and her caregiver who agree to proceed with biopsy. The patient will be contacted by our scheduler to arrange the biopsy appointment.   BI-RADS CATEGORY  5: Highly suggestive of malignancy.     Electronically Signed   By: Audie Pinto M.D.   On: 06/20/2021 15:04 Within last 24 hrs: No results found.  Assessment    Advanced right breast cancer, with local dermal involvement. Patient Active Problem List   Diagnosis Date Noted   Malignant neoplasm of overlapping sites of right breast in female, estrogen receptor positive ([place]) 83/66/2947   Biliary colic 65/46/5035   Ruptured globe of right eye 12/22/2016   Left upper quadrant pain    Acute pancreatitis    Abdominal pain 12/16/2016   Schizophrenia, paranoid ([place]) 11/03/2016   Right eye injury 11/03/2016   HLD (hyperlipidemia) 07/25/2012   Intellectual disability 07/25/2012   Cancer of body of uterus ([place]) 05/31/2012   Malignant neoplasm of body of uterus ([place]) 05/31/2012    Plan    Right total mastectomy with right sentinel lymph node biopsy.  I discussed the available options with the patient, and others present. The risk of recurrence is  similar between mastectomy and lumpectomy with radiation.   I also discussed that we would need to do a sentinel lymph node biopsy to check the nodes.   Explained to the patient that after her surgical treatment additional treatment will depend on her prognostic indicators and stage.   We feel it is prudent not to attempt preoperative neoadjuvant, as she may not be willing to comply with chemotherapy anyway.  She is also been quite adamant against any kind of radiation and mastectomy alone is something she opposes. However  its very clear based upon her relative breast size and the size of this mass and the dermal involvement, that breast conservation treatments not an option.  I doubt that any neoadjuvant therapy would diminish the size of this mass especially considering the involvement of the overlying skin. In discussion with Dr. Tasia Catchings we think considering her social situation proceeding with mastectomy has her best interest involved in order to revoke the best local control.  I discussed risks of bleeding, infection, damage to surrounding tissues, having positive margins, needing further resection, damage to nerves causing arm numbness or difficulty raising arm, causing lymphedema in the arm; as well as anesthesia risks of MI, stroke, prolonged ventilation, pulmonary embolism, thrombosis and even death.   Patient was given the opportunity to ask questions and have them answered.  They would like to proceed with right total mastectomy with sentinel lymph node biopsy.    Face-to-face time spent with the patient and accompanying care providers(if present) was 60 minutes, with more than 50% of the time spent counseling, educating, and coordinating care of the patient.    These notes generated with voice recognition software. I apologize for typographical errors.  Ronny Bacon M.D., FACS 07/21/2021, 3:36 PM

## 2021-07-21 NOTE — Progress Notes (Addendum)
Patient ID: Kylie Saunders, female   DOB: 03-Nov-1961, 60 y.o.   MRN: [id number]  Chief Complaint: Right breast cancer  History of Present Illness Kylie Saunders is a 60 y.o. female with a year history of right breast mass.  She lives in a group home due to the issues noted in her past medical history below. Mass has progressed, she obtained a percutaneous biopsy confirming malignancy.  Ultrasound of the right axilla did not show any lymphadenopathy.  No appreciable disease on imaging in the left breast.  There appears to be some dermal involvement.  We spent an extensive period of time discussing her risks in compromising her cancer treatment, along with her unwillingness to allow her breast to be removed.  Her brother Kylie Saunders was available by phone as well as her associated case Production designer, theatre/television/film. She reports she has never been pregnant, and began menstruating at the age of 70.  She denies any family history of breast cancer.  She underwent a hysterectomy in 2013.  She never utilized birth control or hormonal therapy.  She denies any history of pain. 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   Past Medical History Past Medical History:  Diagnosis Date   Acute pancreatitis    Anxiety    Cancer of body of uterus (HCC) 05/31/2012   Endometrial cancer, had hysterectomy w/BSO 07/25/12   HLD (hyperlipidemia) 07/25/2012   Hypertension    Intellectual disability 07/25/2012   Obesity    Pancreatitis due to biliary obstruction    Paranoid schizophrenia (HCC)    Right eye injury 11/03/2016      Past Surgical History:  Procedure Laterality Date   ABDOMINAL HYSTERECTOMY  07/25/2012   with BSO   BREAST BIOPSY Right 07/07/2021   [place] bx mass, venus marker, path pending   CHOLECYSTECTOMY N/A 01/24/2017   Procedure: LAPAROSCOPIC CHOLECYSTECTOMY WITH INTRAOPERATIVE CHOLANGIOGRAM;  Surgeon: Lattie Haw, MD;  Location: [facility] ORS;  Service: General;   Laterality: N/A;   EYE SURGERY     TONSILLECTOMY      No Known Allergies  Current Outpatient Medications  Medication Sig Dispense Refill   amLODipine (NORVASC) 5 MG tablet Take 5 mg by mouth daily.     aspirin 81 MG chewable tablet CHEW ONE TABLET BY MOUTH EVERY DAY     benztropine (COGENTIN) 2 MG tablet Take 1 tablet (2 mg total) by mouth daily. 30 tablet 2   lisinopril-hydrochlorothiazide (PRINZIDE,ZESTORETIC) 20-12.5 MG tablet Take 0.5 tablets by mouth daily.     loratadine (CLARITIN) 10 MG tablet Take 10 mg by mouth daily.     LORazepam (ATIVAN) 0.5 MG tablet Take 1 tablet (0.5 mg total) by mouth 2 (two) times daily. 60 tablet 2   omeprazole (PRILOSEC) 40 MG capsule Take 40 mg by mouth daily.     Polyvinyl Alcohol-Povidone (REFRESH OP) Apply 1 application to eye 2 (two) times daily. Right eye only     risperiDONE (RISPERDAL) 2 MG tablet Take 1 tablet (2 mg total) by mouth 2 (two) times daily. 60 tablet 2   simvastatin (ZOCOR) 40 MG tablet Take 40 mg by mouth daily.      No current facility-administered medications for this visit.    Family History Family History  Problem Relation Age of Onset   Diabetes Neg Hx    Hypertension Neg Hx       Social History Social History   Tobacco Use   Smoking status: Never  Smokeless tobacco: Never  Vaping Use   Vaping Use: Never used  Substance Use Topics   Alcohol use: No    Alcohol/week: 0.0 standard drinks   Drug use: No        Review of Systems  Constitutional: Negative.   HENT: Negative.    Eyes: Negative.   Respiratory: Negative.    Cardiovascular: Negative.   Gastrointestinal: Negative.   Genitourinary: Negative.   Skin:  Positive for itching (She picks at the skin).  Neurological: Negative.   Psychiatric/Behavioral:  Positive for hallucinations (Paranoid schizophrenia,) and memory loss (Cognitive disability). The patient is nervous/anxious.   All other systems reviewed and are negative.    Physical Exam Blood  pressure 114/78, pulse 94, temperature 98.9 F (37.2 C), weight 178 lb 12.8 oz (81.1 kg), SpO2 95 %. Last Weight  Most recent update: 07/21/2021  2:36 PM    Weight  81.1 kg (178 lb 12.8 oz)             CONSTITUTIONAL: Well developed, and nourished, appropriately responsive and aware without distress.  Obviously quite anxious, and somewhat emotional when discussing the need to remove the breast in order to provide adequate cancer treatment, and hopefully life lengthening treatment. EYES: Sclera non-icteric.  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 EARS, NOSE, MOUTH AND THROAT: Mask worn.   The oropharynx is clear. Oral mucosa is pink and moist.   Hearing is intact to voice.  NECK: Trachea is midline, and there is no jugular venous distension.  LYMPH NODES:  Lymph nodes in the neck are not enlarged. RESPIRATORY:  Lungs are clear, and breath sounds are equal bilaterally. Normal respiratory effort without pathologic use of accessory muscles. CARDIOVASCULAR: Heart is regular in rate and rhythm. GI: The abdomen is soft, nontender, and nondistended.  Well rounded with prior laparoscopic scars. GU: Breast exam is notable for a vague density of the medial inferior right breast, some dermal changes and dimpling in the vicinity approaching the nipple areolar complex.  The skin changes do not appear consistent with inflammatory cancer, there is no pink hue and there is no remarkable edema associated.  The mass does not feel fixed to the underlying tissues.  No appreciable palpable axillary adenopathy present.  The left breast is unremarkable, no suspicious or dominant nodularity whatsoever. MUSCULOSKELETAL:  Symmetrical muscle tone appreciated in all four extremities.    SKIN: Skin turgor is normal. No pathologic skin lesions appreciated.  NEUROLOGIC:  Motor and sensation appear grossly normal.  Cranial nerves are grossly without defect. PSYCH:  Alert and oriented to person, place and time.  Affect is appropriate for situation.  Data Reviewed I have personally reviewed what is currently available of the patient's imaging, recent labs and medical records.   Labs:  CBC Latest Ref Rng & Units 07/18/2021 07/07/2018 01/25/2017  WBC 4.0 - 10.5 K/uL 8.7 8.7 8.6  Hemoglobin 12.0 - 15.0 g/dL 12.4 13.2 12.5  Hematocrit 36.0 - 46.0 % 38.6 38.2 37.7  Platelets 150 - 400 K/uL 220 230 208   CMP Latest Ref Rng & Units 07/18/2021 07/07/2018 01/25/2017  Glucose 70 - 99 mg/dL 100(H) 105(H) 136(H)  BUN 8 - 23 mg/dL $Remove'9 16 10  'ASTkNWC$ Creatinine 0.44 - 1.00 mg/dL 0.83 0.93 0.87  Sodium 135 - 145 mmol/L 137 139 135  Potassium 3.5 - 5.1 mmol/L 3.7 3.7 3.9  Chloride 98 - 111 mmol/L 97(L) 102 103  CO2 22 - 32 mmol/L $RemoveB'31 28 25  'RdpgrujC$ Calcium 8.9 -  10.3 mg/dL 8.8(L) 8.6(L) 9.0  Total Protein 6.5 - 8.1 g/dL 7.6 7.1 7.1  Total Bilirubin 0.3 - 1.2 mg/dL 0.4 0.4 0.3  Alkaline Phos 38 - 126 U/L 69 68 61  AST 15 - 41 U/L 14(L) 14(L) 26  ALT 0 - 44 U/L $Remo'14 15 27   'pFgml$ SURGICAL PATHOLOGY   * THIS IS AN ADDENDUM REPORT *  CASE: [id number]  PATIENT: [facility]  Surgical Pathology Report  *Addendum *   Reason for Addendum #1:  Breast Biomarker Results   Specimen Submitted:  A. Breast, right   Clinical History: Right Br 3 oc mass - expect cancer. Venus tissue  marker clip placed following ultrasound guided core needle biopsy of  RIGHT breast at 3 o'clock.     DIAGNOSIS:  A. BREAST, RIGHT AT 3:00, 1 CM FROM THE NIPPLE; ULTRASOUND-GUIDED CORE  NEEDLE BIOPSY:  - INVASIVE MAMMARY CARCINOMA, NO SPECIAL TYPE.   Size of invasive carcinoma: 13 mm in this sample  Histologic grade of invasive carcinoma: Grade 3                       Glandular/tubular differentiation score: 3                       Nuclear pleomorphism score: 2                       Mitotic rate score: 3                       Total score: 8  Ductal carcinoma in situ: Not identified  Lymphovascular invasion: Not identified   ER/PR/HER2:  Immunohistochemistry will be performed on block A1, with  reflex to Rembrandt for HER2 2+. The results will be reported in an addendum.   GROSS DESCRIPTION:  A. Labeled: Right breast 3:00 1 cm from nipple  Received: Formalin  Time/date in fixative: Collected and placed in formalin at 1:25 PM on  07/07/2021  Cold ischemic time: Less than 1 minute  Total fixation time: Approximately 6.5 hours  Core pieces: 3 cores and 2 additional fragments  Size: Range from 0.5-2.2 cm in length and range from 0.2-0.3 cm in  diameter  Description: Received are cores and fragments of yellow and white  fibrofatty tissue.  The 2 additional fragments range from 0.4 to 0.5 cm  in greatest dimension.  Ink color: Black  Entirely submitted in cassettes 1-2 with 2 cores in cassette 1 and 1  core with the remaining fragments in cassette 2.   RB 07/07/2021     Final Diagnosis performed by Allena Napoleon, MD.   Electronically signed  07/08/2021 10:44:53AM  The electronic signature indicates that the named Attending Pathologist  has evaluated the specimen  Technical component performed at [facility], [address] [place],  [place] [address] Lab: [phone] Dir: Rush Farmer, MD, MMM   Professional component performed at [facility], [facility], [place], [place],  [address] Lab: [phone]  Dir: Dellia Nims. Rubinas, MD   ADDENDUM:  CASE SUMMARY: BREAST BIOMARKER TESTS  Estrogen Receptor (ER) Status: POSITIVE          Percentage of cells with nuclear positivity: >90%          Average intensity of staining: Strong   Progesterone Receptor (PgR) Status: POSITIVE          Percentage of cells with nuclear positivity: >90%  Average intensity of staining: Strong   HER2 (by immunohistochemistry): NEGATIVE (Score 0)   Cold Ischemia and Fixation Times: Meet requirements specified in latest  version of the ASCO/CAP guidelines     Imaging: Radiology review:  CLINICAL DATA:   60 year old female presenting for evaluation of a right breast lump. Patient presents with her caregiver, she lives in a group home. Patient reports the mass in the right breast has been present for approximately 1 year.   EXAM: DIGITAL DIAGNOSTIC BILATERAL MAMMOGRAM WITH TOMOSYNTHESIS AND CAD; ULTRASOUND RIGHT BREAST LIMITED   TECHNIQUE: Bilateral digital diagnostic mammography and breast tomosynthesis was performed. The images were evaluated with computer-aided detection.; Targeted ultrasound examination of the right breast was performed   COMPARISON:  Previous exam(s).   ACR Breast Density Category c: The breast tissue is heterogeneously dense, which may obscure small masses.   FINDINGS: Mammogram:   Right breast: A skin BB marks the site of concern reported by the patient in the lower inner right breast. A spot tangential view of this area is performed in addition to standard views. At the palpable site there is an irregular spiculated mass with distortion measuring approximately 1.4 cm. There is evidence of skin retraction.   Left breast: No suspicious mass, distortion, or microcalcifications are identified to suggest presence of malignancy.   On physical exam at the site of concern in the lower inner right breast there is skin dimpling. No appreciable erythema or discoloration. There is a fixed discrete mass.   Ultrasound:   Targeted ultrasound performed at the palpable site in the right breast at 3 o'clock 1 cm from the nipple demonstrating an irregular hypoechoic mass measuring 1.2 x 1.3 x 1.1 cm. There is extension to the overlying skin. There is mild overlying skin thickening.   Targeted ultrasound the right axilla demonstrates normal lymph nodes.   IMPRESSION: 1. At the palpable site of concern in the medial right breast there is a highly suspicious mass measuring 1.3 cm with overlying skin dimpling and skin thickening concerning for skin involvement.    2.  Normal right axillary ultrasound.   3.  No mammographic evidence of malignancy in the left breast.   RECOMMENDATION: Ultrasound-guided core needle biopsy of the right breast mass at 3 o'clock.   I have discussed the findings and recommendations with the patient and her caregiver who agree to proceed with biopsy. The patient will be contacted by our scheduler to arrange the biopsy appointment.   BI-RADS CATEGORY  5: Highly suggestive of malignancy.     Electronically Signed   By: Audie Pinto M.D.   On: 06/20/2021 15:04 Within last 24 hrs: No results found.  Assessment    Advanced right breast cancer, with local dermal involvement. Patient Active Problem List   Diagnosis Date Noted   Malignant neoplasm of overlapping sites of right breast in female, estrogen receptor positive ([place]) 16/08/9603   Biliary colic 54/07/8118   Ruptured globe of right eye 12/22/2016   Left upper quadrant pain    Acute pancreatitis    Abdominal pain 12/16/2016   Schizophrenia, paranoid ([place]) 11/03/2016   Right eye injury 11/03/2016   HLD (hyperlipidemia) 07/25/2012   Intellectual disability 07/25/2012   Cancer of body of uterus ([place]) 05/31/2012   Malignant neoplasm of body of uterus ([place]) 05/31/2012    Plan    Right total mastectomy with right sentinel lymph node biopsy.  I discussed the available options with the patient, and others present. The risk of recurrence is  similar between mastectomy and lumpectomy with radiation.   I also discussed that we would need to do a sentinel lymph node biopsy to check the nodes.   Explained to the patient that after her surgical treatment additional treatment will depend on her prognostic indicators and stage.   We feel it is prudent not to attempt preoperative neoadjuvant, as she may not be willing to comply with chemotherapy anyway.  She is also been quite adamant against any kind of radiation and mastectomy alone is something she opposes. However  its very clear based upon her relative breast size and the size of this mass and the dermal involvement, that breast conservation treatments not an option.  I doubt that any neoadjuvant therapy would diminish the size of this mass especially considering the involvement of the overlying skin. In discussion with Dr. Tasia Catchings we think considering her social situation proceeding with mastectomy has her best interest involved in order to revoke the best local control.  I discussed risks of bleeding, infection, damage to surrounding tissues, having positive margins, needing further resection, damage to nerves causing arm numbness or difficulty raising arm, causing lymphedema in the arm; as well as anesthesia risks of MI, stroke, prolonged ventilation, pulmonary embolism, thrombosis and even death.   Patient was given the opportunity to ask questions and have them answered.  They would like to proceed with right total mastectomy with sentinel lymph node biopsy.    Face-to-face time spent with the patient and accompanying care providers(if present) was 60 minutes, with more than 50% of the time spent counseling, educating, and coordinating care of the patient.    These notes generated with voice recognition software. I apologize for typographical errors.  Ronny Bacon M.D., FACS 07/21/2021, 3:36 PM

## 2021-07-21 NOTE — Telephone Encounter (Signed)
Patient calls back and wants to proceed with treatment plan as she and Dr. Christian Mate have discussed.  Please advise.  Thank you.

## 2021-07-22 ENCOUNTER — Other Ambulatory Visit: Payer: Self-pay | Admitting: Surgery

## 2021-07-22 ENCOUNTER — Ambulatory Visit: Payer: Self-pay | Admitting: Surgery

## 2021-07-22 DIAGNOSIS — C50811 Malignant neoplasm of overlapping sites of right female breast: Secondary | ICD-10-CM

## 2021-07-22 DIAGNOSIS — Z170 Estrogen receptor positive status [ER+]: Secondary | ICD-10-CM

## 2021-07-22 NOTE — Telephone Encounter (Signed)
Patient has been scheduled for surgery at [facility] on 07/27/21. She will need to arrive at the medical mall entrance that day by 9:15 am for her sentinel node injection.  I have spoken with Skeet Simmer with the group home and she is aware of arrival time.   The patient will not need a Covid test.   Pre admit testing will do a phone call on 07/26/21 in the morning.   I spoke with Valda Lamb, the patient's brother and guardian, and he is aware of all the details.

## 2021-07-26 ENCOUNTER — Other Ambulatory Visit: Payer: Self-pay

## 2021-07-26 ENCOUNTER — Encounter: Payer: Self-pay | Admitting: Surgery

## 2021-07-26 ENCOUNTER — Other Ambulatory Visit
Admission: RE | Admit: 2021-07-26 | Discharge: 2021-07-26 | Disposition: A | Discharge status: Home / Self Care | Payer: Medicare HMO | Source: Ambulatory Visit | Attending: Surgery | Admitting: Surgery

## 2021-07-26 NOTE — Pre-Procedure Instructions (Signed)
Patient instructions for surgery discussed over the phone with Skeet Simmer, staff at the family care home. She verbalized understanding.

## 2021-07-26 NOTE — Patient Instructions (Addendum)
Your procedure is scheduled on:07/27/2021 Report to the Registration Desk on the 1st floor of the Medical Mall 9:15 per Instructions. You will be directed to same day surgery floor after Nuclear Medicine procedure. To find out your arrival time, please call [phone] between 1PM - 3PM on: 07/26/2021   REMEMBER: Instructions that are not followed completely may result in serious medical risk, up to and including death; or upon the discretion of your surgeon and anesthesiologist your surgery may need to be rescheduled.  Do not eat food after midnight the night before surgery.  No gum chewing, lozengers or hard candies.  You may however, drink water up to 2 hours before you are scheduled to arrive for your surgery. Cut off time for drinking is 7:15 am .  Do not drink anything within 2 hours of your scheduled arrival time.  Clear liquids include: - water      TAKE THESE MEDICATIONS THE MORNING OF SURGERY WITH A SIP OF WATER: Amlodipine Cogentin Ativan Omeprazole (take one the night before and one on the morning of surgery - helps to prevent nausea after surgery.) Risperdal    Follow recommendations from Cardiologist, Pulmonologist or PCP regarding stopping Aspirin. Do not take aspirin on day of surgery.   One week prior to surgery: Stop Anti-inflammatories (NSAIDS) such as Advil, Aleve, Ibuprofen, Motrin, Naproxen, Naprosyn and Aspirin based products such as Excedrin, Goodys Powder, BC Powder.  Stop ANY OVER THE COUNTER supplements until after surgery. You may however, continue to take Tylenol if needed for pain up until the day of surgery.  No Alcohol for 24 hours before or after surgery.  No Smoking including e-cigarettes for 24 hours prior to surgery.  No chewable tobacco products for at least 6 hours prior to surgery.  No nicotine patches on the day of surgery.  Do not use any "recreational" drugs for at least a week prior to your surgery.  Please be advised that the  combination of cocaine and anesthesia may have negative outcomes, up to and including death. If you test positive for cocaine, your surgery will be cancelled.  On the morning of surgery brush your teeth with toothpaste and water, you may rinse your mouth with mouthwash if you wish. Do not swallow any toothpaste or mouthwash.  Do not wear jewelry, make-up, hairpins, clips or nail polish.  Do not wear lotions, powders, or perfumes.   Do not shave body from the neck down 48 hours prior to surgery just in case you cut yourself which could leave a site for infection.  Also, freshly shaved skin may become irritated if using the CHG soap.  Contact lenses, hearing aids and dentures may not be worn into surgery.  Do not bring valuables to the hospital. [facility] is not responsible for any missing/lost belongings or valuables.   Use CHG Soap or wipes as directed on instruction sheet.  Notify your doctor if there is any change in your medical condition (cold, fever, infection).  Wear comfortable clothing (specific to your surgery type) to the hospital.  After surgery, you can help prevent lung complications by doing breathing exercises.  Take deep breaths and cough every 1-2 hours. Your doctor may order a device called an Incentive Spirometer to help you take deep breaths.  If you are being admitted to the hospital overnight, leave your suitcase in the car. After surgery it may be brought to your room.  If you are being discharged the day of surgery, you will not be  allowed to drive home. You will need a responsible adult (18 years or older) to drive you home and stay with you that night.   If you are taking public transportation, you will need to have a responsible adult (18 years or older) with you. Please confirm with your physician that it is acceptable to use public transportation.   Please call the [facility] at [phone] if you have any questions about these  instructions.  Surgery Visitation Policy:  Patients undergoing a surgery or procedure may have one family member or support person with them as long as that person is not COVID-19 positive or experiencing its symptoms.  That person may remain in the waiting area during the procedure.  Inpatient Visitation:    Visiting hours are 7 a.m. to 8 p.m. Inpatients will be allowed two visitors daily. The visitors may change each day during the patient's stay. No visitors under the age of 45. Any visitor under the age of 33 must be accompanied by an adult. The visitor must pass COVID-19 screenings, use hand sanitizer when entering and exiting the patient's room and wear a mask at all times, including in the patient's room. Patients must also wear a mask when staff or their visitor are in the room. Masking is required regardless of vaccination status.

## 2021-07-26 NOTE — Pre-Procedure Instructions (Signed)
HCPOA,Kylie Saunders was made aware that the surgeon and anesthesiologist maybe calling him to discuss the surgery. He verbalized understanding.

## 2021-07-26 NOTE — Patient Instructions (Signed)
Your procedure is scheduled on:07/27/2021 Report to the Registration Desk on the 1st floor of the [facility] 9:15 per Instructions.  To find out your arrival time, please call [phone] between 1PM - 3PM on: 07/26/2021  REMEMBER: Instructions that are not followed completely may result in serious medical risk, up to and including death; or upon the discretion of your surgeon and anesthesiologist your surgery may need to be rescheduled.  Do not eat food after midnight the night before surgery.  No gum chewing, lozengers or hard candies.  You may however, drink CLEAR liquids up to 2 hours before you are scheduled to arrive for your surgery. Do not drink anything within 2 hours of your scheduled arrival time.  Clear liquids include: - water  - apple juice without pulp - gatorade (not RED, PURPLE, OR BLUE) - black coffee or tea (Do NOT add milk or creamers to the coffee or tea) Do NOT drink anything that is not on this list.  In addition, your doctor has ordered for you to drink the provided  Ensure Pre-Surgery Clear Carbohydrate Drink  Gatorade G2 Drinking this carbohydrate drink up to two hours before surgery helps to reduce insulin resistance and improve patient outcomes. Please complete drinking 2 hours prior to scheduled arrival time.  TAKE THESE MEDICATIONS THE MORNING OF SURGERY WITH A SIP OF WATER:  (take one the night before and one on the morning of surgery - helps to prevent nausea after surgery.)  Use inhalers on the day of surgery and bring to the hospital.  Stop Metformin and Janumet 2 days prior to surgery.  Take 1/2 of usual insulin dose the night before surgery and none on the morning of surgery.  Follow recommendations from Cardiologist, Pulmonologist or PCP regarding stopping Aspirin, Coumadin, Plavix, Eliquis, Pradaxa, or Pletal.  One week prior to surgery: Stop Anti-inflammatories (NSAIDS) such as Advil, Aleve, Ibuprofen, Motrin, Naproxen, Naprosyn and Aspirin  based products such as Excedrin, Goodys Powder, BC Powder. Stop ANY OVER THE COUNTER supplements until after surgery. You may however, continue to take Tylenol if needed for pain up until the day of surgery.  No Alcohol for 24 hours before or after surgery.  No Smoking including e-cigarettes for 24 hours prior to surgery.  No chewable tobacco products for at least 6 hours prior to surgery.  No nicotine patches on the day of surgery.  Do not use any "recreational" drugs for at least a week prior to your surgery.  Please be advised that the combination of cocaine and anesthesia may have negative outcomes, up to and including death. If you test positive for cocaine, your surgery will be cancelled.  On the morning of surgery brush your teeth with toothpaste and water, you may rinse your mouth with mouthwash if you wish. Do not swallow any toothpaste or mouthwash.  Do not wear jewelry, make-up, hairpins, clips or nail polish.  Do not wear lotions, powders, or perfumes.   Do not shave body from the neck down 48 hours prior to surgery just in case you cut yourself which could leave a site for infection.  Also, freshly shaved skin may become irritated if using the CHG soap.  Contact lenses, hearing aids and dentures may not be worn into surgery.  Do not bring valuables to the hospital. [facility] is not responsible for any missing/lost belongings or valuables.   Use CHG Soap or wipes as directed on instruction sheet.  Total Shoulder Arthroplasty:  use Benzolyl Peroxide 5% Gel  as directed on instruction sheet.  Fleets enema or Magnesium Citrate as directed.  Bring your C-PAP to the hospital with you in case you may have to spend the night.   Notify your doctor if there is any change in your medical condition (cold, fever, infection).  Wear comfortable clothing (specific to your surgery type) to the hospital.  After surgery, you can help prevent lung complications by doing breathing  exercises.  Take deep breaths and cough every 1-2 hours. Your doctor may order a device called an Incentive Spirometer to help you take deep breaths. When coughing or sneezing, hold a pillow firmly against your incision with both hands. This is called "splinting." Doing this helps protect your incision. It also decreases belly discomfort.  If you are being admitted to the hospital overnight, leave your suitcase in the car. After surgery it may be brought to your room.  If you are being discharged the day of surgery, you will not be allowed to drive home. You will need a responsible adult (18 years or older) to drive you home and stay with you that night.   If you are taking public transportation, you will need to have a responsible adult (18 years or older) with you. Please confirm with your physician that it is acceptable to use public transportation.   Please call the [facility] at [phone] if you have any questions about these instructions.  Surgery Visitation Policy:  Patients undergoing a surgery or procedure may have one family member or support person with them as long as that person is not COVID-19 positive or experiencing its symptoms.  That person may remain in the waiting area during the procedure.  Inpatient Visitation:    Visiting hours are 7 a.m. to 8 p.m. Inpatients will be allowed two visitors daily. The visitors may change each day during the patient's stay. No visitors under the age of 7. Any visitor under the age of 42 must be accompanied by an adult. The visitor must pass COVID-19 screenings, use hand sanitizer when entering and exiting the patient's room and wear a mask at all times, including in the patient's room. Patients must also wear a mask when staff or their visitor are in the room. Masking is required regardless of vaccination status.

## 2021-07-27 ENCOUNTER — Ambulatory Visit: Payer: Medicare HMO | Admitting: Urgent Care

## 2021-07-27 ENCOUNTER — Ambulatory Visit
Admission: RE | Admit: 2021-07-27 | Discharge: 2021-07-27 | Disposition: A | Discharge status: Home / Self Care | Payer: Medicare HMO | Source: Ambulatory Visit | Attending: Surgery | Admitting: Surgery

## 2021-07-27 ENCOUNTER — Ambulatory Visit (HOSPITAL_BASED_OUTPATIENT_CLINIC_OR_DEPARTMENT_OTHER)
Admission: RE | Admit: 2021-07-27 | Discharge: 2021-07-27 | Disposition: A | Discharge status: Home / Self Care | Payer: Medicare HMO | Source: Home / Self Care | Attending: Surgery | Admitting: Surgery

## 2021-07-27 ENCOUNTER — Ambulatory Visit: Payer: Medicare HMO | Admitting: Oncology

## 2021-07-27 ENCOUNTER — Encounter: Payer: Self-pay | Admitting: Surgery

## 2021-07-27 ENCOUNTER — Encounter
Admission: RE | Disposition: A | Discharge status: Home / Self Care | Payer: Self-pay | Source: Home / Self Care | Attending: Surgery

## 2021-07-27 DIAGNOSIS — R4182 Altered mental status, unspecified: Secondary | ICD-10-CM | POA: Diagnosis not present

## 2021-07-27 DIAGNOSIS — Z90722 Acquired absence of ovaries, bilateral: Secondary | ICD-10-CM | POA: Insufficient documentation

## 2021-07-27 DIAGNOSIS — Z9079 Acquired absence of other genital organ(s): Secondary | ICD-10-CM | POA: Insufficient documentation

## 2021-07-27 DIAGNOSIS — Z79899 Other long term (current) drug therapy: Secondary | ICD-10-CM | POA: Insufficient documentation

## 2021-07-27 DIAGNOSIS — Z170 Estrogen receptor positive status [ER+]: Secondary | ICD-10-CM | POA: Diagnosis not present

## 2021-07-27 DIAGNOSIS — C773 Secondary and unspecified malignant neoplasm of axilla and upper limb lymph nodes: Secondary | ICD-10-CM

## 2021-07-27 DIAGNOSIS — C50811 Malignant neoplasm of overlapping sites of right female breast: Secondary | ICD-10-CM | POA: Insufficient documentation

## 2021-07-27 DIAGNOSIS — Z90710 Acquired absence of both cervix and uterus: Secondary | ICD-10-CM | POA: Insufficient documentation

## 2021-07-27 DIAGNOSIS — Z8542 Personal history of malignant neoplasm of other parts of uterus: Secondary | ICD-10-CM | POA: Insufficient documentation

## 2021-07-27 DIAGNOSIS — Z7982 Long term (current) use of aspirin: Secondary | ICD-10-CM | POA: Insufficient documentation

## 2021-07-27 DIAGNOSIS — T8144XA Sepsis following a procedure, initial encounter: Secondary | ICD-10-CM | POA: Diagnosis not present

## 2021-07-27 DIAGNOSIS — Z01810 Encounter for preprocedural cardiovascular examination: Secondary | ICD-10-CM

## 2021-07-27 HISTORY — PX: AXILLARY LYMPH NODE BIOPSY: SHX5737

## 2021-07-27 HISTORY — DX: Gastro-esophageal reflux disease without esophagitis: K21.9

## 2021-07-27 HISTORY — PX: SIMPLE MASTECTOMY WITH AXILLARY SENTINEL NODE BIOPSY: SHX6098

## 2021-07-27 LAB — CBC WITH DIFFERENTIAL/PLATELET
Abs Immature Granulocytes: 0.02 10*3/uL (ref 0.00–0.07)
Basophils Absolute: 0 10*3/uL (ref 0.0–0.1)
Basophils Relative: 1 %
Eosinophils Absolute: 0.2 10*3/uL (ref 0.0–0.5)
Eosinophils Relative: 3 %
HCT: 38.4 % (ref 36.0–46.0)
Hemoglobin: 12.8 g/dL (ref 12.0–15.0)
Immature Granulocytes: 0 %
Lymphocytes Relative: 18 %
Lymphs Abs: 1.5 10*3/uL (ref 0.7–4.0)
MCH: 30 pg (ref 26.0–34.0)
MCHC: 33.3 g/dL (ref 30.0–36.0)
MCV: 89.9 fL (ref 80.0–100.0)
Monocytes Absolute: 0.8 10*3/uL (ref 0.1–1.0)
Monocytes Relative: 9 %
Neutro Abs: 6 10*3/uL (ref 1.7–7.7)
Neutrophils Relative %: 69 %
Platelets: 213 10*3/uL (ref 150–400)
RBC: 4.27 MIL/uL (ref 3.87–5.11)
RDW: 14.1 % (ref 11.5–15.5)
WBC: 8.6 10*3/uL (ref 4.0–10.5)
nRBC: 0 % (ref 0.0–0.2)

## 2021-07-27 LAB — COMPREHENSIVE METABOLIC PANEL
ALT: 15 U/L (ref 0–44)
AST: 14 U/L — ABNORMAL LOW (ref 15–41)
Albumin: 3.9 g/dL (ref 3.5–5.0)
Alkaline Phosphatase: 58 U/L (ref 38–126)
Anion gap: 11 (ref 5–15)
BUN: 12 mg/dL (ref 8–23)
CO2: 27 mmol/L (ref 22–32)
Calcium: 8.9 mg/dL (ref 8.9–10.3)
Chloride: 98 mmol/L (ref 98–111)
Creatinine, Ser: 0.82 mg/dL (ref 0.44–1.00)
GFR, Estimated: >60 mL/min (ref >60)
Glucose, Bld: 104 mg/dL — ABNORMAL HIGH (ref 70–99)
Potassium: 3.5 mmol/L (ref 3.5–5.1)
Sodium: 136 mmol/L (ref 135–145)
Total Bilirubin: 0.4 mg/dL (ref 0.3–1.2)
Total Protein: 7.1 g/dL (ref 6.5–8.1)

## 2021-07-27 SURGERY — SIMPLE MASTECTOMY
Anesthesia: General | Laterality: Right

## 2021-07-27 MED ORDER — MIDAZOLAM HCL 2 MG/2ML IJ SOLN
INTRAMUSCULAR | Status: AC
  Filled 2021-07-27: qty 2

## 2021-07-27 MED ORDER — BUPIVACAINE LIPOSOME 1.3 % IJ SUSP
INTRAMUSCULAR | Status: AC
  Filled 2021-07-27: qty 20

## 2021-07-27 MED ORDER — CHLORHEXIDINE GLUCONATE 0.12 % MT SOLN
OROMUCOSAL | Status: AC
  Filled 2021-07-27: qty 15

## 2021-07-27 MED ORDER — BUPIVACAINE HCL (PF) 0.25 % IJ SOLN
INTRAMUSCULAR | Status: AC
  Filled 2021-07-27: qty 30

## 2021-07-27 MED ORDER — CEFAZOLIN SODIUM-DEXTROSE 2-4 GM/100ML-% IV SOLN
2.0000 g | INTRAVENOUS | Status: AC
  Administered 2021-07-27: 2 g via INTRAVENOUS

## 2021-07-27 MED ORDER — FENTANYL CITRATE (PF) 100 MCG/2ML IJ SOLN
INTRAMUSCULAR | Status: AC
  Filled 2021-07-27: qty 2

## 2021-07-27 MED ORDER — DEXMEDETOMIDINE (PRECEDEX) IN NS 20 MCG/5ML (4 MCG/ML) IV SYRINGE
PREFILLED_SYRINGE | INTRAVENOUS | Status: DC | PRN
  Administered 2021-07-27 (×2): 4 ug via INTRAVENOUS
  Administered 2021-07-27: 8 ug via INTRAVENOUS

## 2021-07-27 MED ORDER — MEPERIDINE HCL 25 MG/ML IJ SOLN: 6.2500 mg | INTRAMUSCULAR | Status: DC | PRN

## 2021-07-27 MED ORDER — ACETAMINOPHEN 500 MG PO TABS
ORAL_TABLET | ORAL | Status: AC
  Filled 2021-07-27: qty 2

## 2021-07-27 MED ORDER — GABAPENTIN 300 MG PO CAPS
300.0000 mg | ORAL_CAPSULE | ORAL | Status: AC
  Administered 2021-07-27: 300 mg via ORAL

## 2021-07-27 MED ORDER — BUPIVACAINE LIPOSOME 1.3 % IJ SUSP
INTRAMUSCULAR | Status: DC | PRN
  Administered 2021-07-27: 20 mL

## 2021-07-27 MED ORDER — BUPIVACAINE-EPINEPHRINE (PF) 0.25% -1:200000 IJ SOLN
INTRAMUSCULAR | Status: DC | PRN
  Administered 2021-07-27: 30 mL

## 2021-07-27 MED ORDER — ISOSULFAN BLUE 1 % ~~LOC~~ SOLN
SUBCUTANEOUS | Status: DC | PRN
  Administered 2021-07-27: 4 mL via SUBCUTANEOUS

## 2021-07-27 MED ORDER — LACTATED RINGERS IV SOLN: INTRAVENOUS | Status: DC

## 2021-07-27 MED ORDER — GABAPENTIN 300 MG PO CAPS
ORAL_CAPSULE | ORAL | Status: AC
  Filled 2021-07-27: qty 1

## 2021-07-27 MED ORDER — CHLORHEXIDINE GLUCONATE CLOTH 2 % EX PADS: 6.0000 | MEDICATED_PAD | Freq: Once | CUTANEOUS | Status: DC

## 2021-07-27 MED ORDER — BUPIVACAINE-EPINEPHRINE (PF) 0.25% -1:200000 IJ SOLN
INTRAMUSCULAR | Status: AC
  Filled 2021-07-27: qty 30

## 2021-07-27 MED ORDER — ORAL CARE MOUTH RINSE: 15.0000 mL | Freq: Once | OROMUCOSAL | Status: DC

## 2021-07-27 MED ORDER — ISOSULFAN BLUE 1 % ~~LOC~~ SOLN
SUBCUTANEOUS | Status: AC
  Filled 2021-07-27: qty 5

## 2021-07-27 MED ORDER — DEXAMETHASONE SODIUM PHOSPHATE 10 MG/ML IJ SOLN
INTRAMUSCULAR | Status: DC | PRN
  Administered 2021-07-27: 5 mg via INTRAVENOUS

## 2021-07-27 MED ORDER — PROPOFOL 10 MG/ML IV BOLUS
INTRAVENOUS | Status: DC | PRN
Start: 2021-07-27 — End: 2021-07-27
  Administered 2021-07-27: 50 mg via INTRAVENOUS
  Administered 2021-07-27: 150 mg via INTRAVENOUS

## 2021-07-27 MED ORDER — IBUPROFEN 800 MG PO TABS: 800.0000 mg | ORAL_TABLET | Freq: Three times a day (TID) | ORAL | 0 refills | Status: DC | PRN

## 2021-07-27 MED ORDER — OXYCODONE HCL 5 MG/5ML PO SOLN
5.0000 mg | Freq: Once | ORAL | Status: DC | PRN
Start: 2021-07-27 — End: 2021-07-27

## 2021-07-27 MED ORDER — FENTANYL CITRATE (PF) 100 MCG/2ML IJ SOLN
INTRAMUSCULAR | Status: AC
  Administered 2021-07-27: 25 ug via INTRAVENOUS
  Filled 2021-07-27: qty 2

## 2021-07-27 MED ORDER — BUPIVACAINE LIPOSOME 1.3 % IJ SUSP: 20.0000 mL | Freq: Once | INTRAMUSCULAR | Status: DC

## 2021-07-27 MED ORDER — CHLORHEXIDINE GLUCONATE 0.12 % MT SOLN: 15.0000 mL | Freq: Once | OROMUCOSAL | Status: DC

## 2021-07-27 MED ORDER — CEFAZOLIN SODIUM-DEXTROSE 2-4 GM/100ML-% IV SOLN
INTRAVENOUS | Status: AC
  Filled 2021-07-27: qty 100

## 2021-07-27 MED ORDER — DEXAMETHASONE SODIUM PHOSPHATE 10 MG/ML IJ SOLN
INTRAMUSCULAR | Status: AC
  Filled 2021-07-27: qty 1

## 2021-07-27 MED ORDER — OXYCODONE HCL 5 MG PO TABS: 5.0000 mg | ORAL_TABLET | Freq: Once | ORAL | Status: DC | PRN

## 2021-07-27 MED ORDER — ONDANSETRON HCL 4 MG/2ML IJ SOLN
INTRAMUSCULAR | Status: DC | PRN
  Administered 2021-07-27: 4 mg via INTRAVENOUS

## 2021-07-27 MED ORDER — MIDAZOLAM HCL 2 MG/2ML IJ SOLN
INTRAMUSCULAR | Status: DC | PRN
  Administered 2021-07-27: 2 mg via INTRAVENOUS

## 2021-07-27 MED ORDER — CELECOXIB 200 MG PO CAPS
200.0000 mg | ORAL_CAPSULE | ORAL | Status: AC
  Administered 2021-07-27: 200 mg via ORAL

## 2021-07-27 MED ORDER — FENTANYL CITRATE (PF) 100 MCG/2ML IJ SOLN
INTRAMUSCULAR | Status: DC | PRN
  Administered 2021-07-27 (×2): 25 ug via INTRAVENOUS
  Administered 2021-07-27: 50 ug via INTRAVENOUS

## 2021-07-27 MED ORDER — STERILE WATER FOR IRRIGATION IR SOLN
Status: DC | PRN
  Administered 2021-07-27: 200 mL

## 2021-07-27 MED ORDER — LIDOCAINE HCL (PF) 2 % IJ SOLN
INTRAMUSCULAR | Status: AC
  Filled 2021-07-27: qty 5

## 2021-07-27 MED ORDER — LIDOCAINE HCL (CARDIAC) PF 100 MG/5ML IV SOSY
PREFILLED_SYRINGE | INTRAVENOUS | Status: DC | PRN
  Administered 2021-07-27: 50 mg via INTRAVENOUS

## 2021-07-27 MED ORDER — SUCCINYLCHOLINE CHLORIDE 200 MG/10ML IV SOSY
PREFILLED_SYRINGE | INTRAVENOUS | Status: AC
  Filled 2021-07-27: qty 10

## 2021-07-27 MED ORDER — ACETAMINOPHEN 500 MG PO TABS
1000.0000 mg | ORAL_TABLET | ORAL | Status: AC
  Administered 2021-07-27: 1000 mg via ORAL

## 2021-07-27 MED ORDER — ONDANSETRON HCL 4 MG/2ML IJ SOLN
INTRAMUSCULAR | Status: AC
  Filled 2021-07-27: qty 2

## 2021-07-27 MED ORDER — CELECOXIB 200 MG PO CAPS
ORAL_CAPSULE | ORAL | Status: AC
  Filled 2021-07-27: qty 1

## 2021-07-27 MED ORDER — PROMETHAZINE HCL 25 MG/ML IJ SOLN: 6.2500 mg | INTRAMUSCULAR | Status: DC | PRN

## 2021-07-27 MED ORDER — PHENYLEPHRINE HCL (PRESSORS) 10 MG/ML IV SOLN
INTRAVENOUS | Status: DC | PRN
  Administered 2021-07-27: 50 ug via INTRAVENOUS
  Administered 2021-07-27 (×8): 100 ug via INTRAVENOUS

## 2021-07-27 MED ORDER — GLYCOPYRROLATE 0.2 MG/ML IJ SOLN
INTRAMUSCULAR | Status: DC | PRN
  Administered 2021-07-27: .2 mg via INTRAVENOUS

## 2021-07-27 MED ORDER — HYDROCODONE-ACETAMINOPHEN 5-325 MG PO TABS: 1.0000 | ORAL_TABLET | Freq: Four times a day (QID) | ORAL | 0 refills | Status: DC | PRN

## 2021-07-27 MED ORDER — TECHNETIUM TC 99M TILMANOCEPT KIT
1.0000 | PACK | Freq: Once | INTRAVENOUS | Status: AC | PRN
  Administered 2021-07-27: 1.039 via INTRADERMAL

## 2021-07-27 MED ORDER — FENTANYL CITRATE (PF) 100 MCG/2ML IJ SOLN: 25.0000 ug | INTRAMUSCULAR | Status: DC | PRN

## 2021-07-27 MED ORDER — PROPOFOL 10 MG/ML IV BOLUS
INTRAVENOUS | Status: AC
  Filled 2021-07-27: qty 20

## 2021-07-27 SURGICAL SUPPLY — 42 items
APL PRP STRL LF DISP 70% ISPRP (MISCELLANEOUS) ×1
BLADE SURG SZ10 CARB STEEL (BLADE) ×4 IMPLANT
BULB RESERV EVAC DRAIN JP 100C (MISCELLANEOUS) ×2 IMPLANT
CHLORAPREP W/TINT 26 (MISCELLANEOUS) ×2 IMPLANT
CLIP VESOCCLUDE MED 6/CT (CLIP) ×2 IMPLANT
DRAIN CHANNEL JP 15F RND 16 (MISCELLANEOUS) ×2 IMPLANT
DRAPE LAPAROTOMY TRNSV 106X77 (MISCELLANEOUS) ×2 IMPLANT
DRSG GAUZE FLUFF 36X18 (GAUZE/BANDAGES/DRESSINGS) ×2 IMPLANT
DRSG TELFA 3X8 NADH (GAUZE/BANDAGES/DRESSINGS) IMPLANT
ELECT CAUTERY BLADE 6.4 (BLADE) ×2 IMPLANT
ELECT CAUTERY BLADE TIP 2.5 (TIP) ×2
ELECT REM PT RETURN 9FT ADLT (ELECTROSURGICAL) ×2
ELECTRODE CAUTERY BLDE TIP 2.5 (TIP) ×1 IMPLANT
ELECTRODE REM PT RTRN 9FT ADLT (ELECTROSURGICAL) ×1 IMPLANT
GAUZE 4X4 16PLY ~~LOC~~+RFID DBL (SPONGE) ×2 IMPLANT
GLOVE SURG ORTHO LTX SZ7.5 (GLOVE) ×12 IMPLANT
GOWN STRL REUS W/ TWL LRG LVL3 (GOWN DISPOSABLE) ×4 IMPLANT
GOWN STRL REUS W/TWL LRG LVL3 (GOWN DISPOSABLE) ×8
HEMOSTAT ARISTA ABSORB 3G PWDR (HEMOSTASIS) IMPLANT
KIT TURNOVER KIT A (KITS) ×2 IMPLANT
MANIFOLD NEPTUNE II (INSTRUMENTS) ×2 IMPLANT
NEEDLE HYPO 22GX1.5 SAFETY (NEEDLE) ×2 IMPLANT
PACK BASIN MAJOR ARMC (MISCELLANEOUS) ×2 IMPLANT
SLEVE PROBE SENORX GAMMA FIND (MISCELLANEOUS) ×2 IMPLANT
SPONGE T-LAP 18X18 ~~LOC~~+RFID (SPONGE) ×4 IMPLANT
STAPLER SKIN PROX 35W (STAPLE) IMPLANT
SUT ETHILON 3-0 FS-10 30 BLK (SUTURE) ×2
SUT MNCRL AB 4-0 PS2 18 (SUTURE) ×4 IMPLANT
SUT SILK 2 0 (SUTURE) ×2
SUT SILK 2 0 SH (SUTURE) ×2 IMPLANT
SUT SILK 2-0 18XBRD TIE 12 (SUTURE) ×1 IMPLANT
SUT VIC AB 3-0 54X BRD REEL (SUTURE) ×1 IMPLANT
SUT VIC AB 3-0 BRD 54 (SUTURE) ×2
SUT VIC AB 3-0 SH 27 (SUTURE) ×4
SUT VIC AB 3-0 SH 27X BRD (SUTURE) ×2 IMPLANT
SUTURE EHLN 3-0 FS-10 30 BLK (SUTURE) ×1 IMPLANT
SWABSTK COMLB BENZOIN TINCTURE (MISCELLANEOUS) IMPLANT
SYR 10ML LL (SYRINGE) IMPLANT
SYR 20ML LL LF (SYRINGE) ×2 IMPLANT
SYR BULB IRRIG 60ML STRL (SYRINGE) ×2 IMPLANT
WATER STERILE IRR 1000ML POUR (IV SOLUTION) ×2 IMPLANT
WATER STERILE IRR 500ML POUR (IV SOLUTION) ×2 IMPLANT

## 2021-07-27 NOTE — Op Note (Signed)
Procedure Date:  07/27/2021  Pre-operative Diagnosis: Right breast cancer.  Post-operative Diagnosis: Same  Procedure: Right simple mastectomy and sentinel lymph node biopsy  Surgeon:  Ronny Bacon, M.D., [facility]  Anesthesia:  General endotracheal  Estimated Blood Loss: 150 ml  Specimens: Right breast with enlarged sentinel lymph node tagged in the axillary tail.  2 separate axillary sentinel nodes accompany in separate container.  Complications: None  Indications for Procedure:  This is a 60 y.o. female who presents with a 1 year history of a right breast mass, challenges include paranoid schizophrenia and cognitive disability.  The risks of bleeding, infection, injury to surrounding structures, hematoma, seroma, open wound, cosmetic deformity, and the need for further surgery were all discussed with the patient, her caregivers/caseworker and her brother and was willing to proceed.  Prior to this procedure, the patient had undergone sentinel lymphoscintigraphy.  Description of Procedure: The patient was correctly identified in the preoperative area and brought into the operating room.  The patient was placed supine with VTE prophylaxis in place.  Appropriate time-outs were performed.  Anesthesia was induced and the patient was intubated.  Appropriate antibiotics were infused.  A visual dye of 4 mL of methylene blue was injected in the right periareolar region under aseptic conditions, massage administered for 5 minutes. The right chest and axilla were prepped and draped in usual sterile fashion.    An elliptical incision was then completed over the breast encompassing the nipple-areolar complex, with attention to the lower medial lesion of which we are concerned.  Using electrosurgery, subcutaneous flaps were created superiorly to the clavicle, inferiorly to the inframammary fold, medially to the sternum, and laterally to the latissimus dorsi with careful attention to create flaps of  adequate thickness.  The breast tissue was then dissected with the pectoralis fascia as the deep margin.  2-0 silk suture was used to mark the specimen as short superior and long lateral.  The cavity was then irrigated and hemostasis was assured with electrocautery.   The hand probe confirmed the largest amount of activity (2500 range) from an enlarged lymph node at the axillary tail of the breast specimen.  This was then tagged as sentinel #1.  Then using the hand-held probe two areas of high counts was identified in the axilla, cautery was used to dissect down the subcutaneous tissue and the hand-held probe was used to guide dissection. A 700 count and a 400 count lymph node was identified and resected.  Neither of these were stained blue, the higher count lymph node was slightly larger, though not pathologic.  The wound bed had a count of less than 200.  There were no other blue stained lymph nodes, nor were there any other pathologically enlarged lymph nodes palpable.  The cavity was irrigated and hemostasis was assured with electrocautery.  Local anesthetic was infiltrated into the pectoral and axillary soft tissues. 31 Fr Blake drain was placed via lateral stab incision to drain the mastectomy wound bed.  Secured with 3-0 nylon.  The incision was then closed with a running 3 V-Loc and sealed with DermaBond.  The drain site was dressed with gauze.  The patient was emerged from anesthesia and extubated and brought to the recovery room for further management.  The patient tolerated the procedure well and all counts were correct at the end of the case.  Sentinel Node Biopsy Synoptic Operative Report  Operation performed with curative intent:Yes  Tracer(s) used to identify sentinel nodes in the upfront surgery (non-neoadjuvant) setting (  select all that apply):Dye and Radioactive Tracer  Tracer(s) used to identify sentinel nodes in the neoadjuvant setting (select all that apply):N/A  All nodes  (colored or non-colored) present at the end of a dye-filled lymphatic channel were removed:Yes   All significantly radioactive nodes were removed:Yes  All palpable suspicious nodes were removed:Yes  Biopsy-proven positive nodes marked with clips prior to chemotherapy were identified and removed:N/A    Ronny Bacon M.D., [facility] 07/27/2021, 12:46 PM

## 2021-07-27 NOTE — Anesthesia Preprocedure Evaluation (Signed)
Anesthesia Evaluation  Patient identified by MRN, date of birth, ID band Patient awake    Reviewed: Allergy & Precautions, NPO status , Patient's Chart, lab work & pertinent test results  History of Anesthesia Complications Negative for: history of anesthetic complications  Airway Mallampati: III  TM Distance: >3 FB Neck ROM: Full    Dental  (+) Poor Dentition   Pulmonary neg pulmonary ROS, neg sleep apnea, neg COPD,    breath sounds clear to auscultation- rhonchi (-) wheezing      Cardiovascular hypertension, Pt. on medications (-) CAD, (-) Past MI, (-) Cardiac Stents and (-) CABG  Rhythm:Regular Rate:Normal - Systolic murmurs and - Diastolic murmurs    Neuro/Psych neg Seizures PSYCHIATRIC DISORDERS Anxiety Schizophrenia negative neurological ROS     GI/Hepatic Neg liver ROS, GERD  ,  Endo/Other  negative endocrine ROSneg diabetes  Renal/GU negative Renal ROS     Musculoskeletal negative musculoskeletal ROS (+)   Abdominal (+) - obese,   Peds  Hematology negative hematology ROS (+)   Anesthesia Other Findings Past Medical History: No date: Anxiety 05/31/2012: Cancer of body of uterus ([place])     Comment:  Endometrial cancer, had hysterectomy w/BSO 07/25/12 No date: GERD (gastroesophageal reflux disease) 12/14/2020: History of 2019 novel coronavirus disease (COVID-19) 07/25/2012: HLD (hyperlipidemia) No date: Hypertension 07/25/2012: Intellectual disability No date: Obesity No date: Pancreatitis due to biliary obstruction No date: Paranoid schizophrenia ([place]) 11/03/2016: Right eye injury   Reproductive/Obstetrics                             Anesthesia Physical Anesthesia Plan  ASA: 2  Anesthesia Plan: General   Post-op Pain Management:    Induction: Intravenous  PONV Risk Score and Plan: 2  Airway Management Planned: LMA  Additional Equipment:   Intra-op Plan:    Post-operative Plan:   Informed Consent: I have reviewed the patients History and Physical, chart, labs and discussed the procedure including the risks, benefits and alternatives for the proposed anesthesia with the patient or authorized representative who has indicated his/her understanding and acceptance.     Dental advisory given  Plan Discussed with: CRNA and Anesthesiologist  Anesthesia Plan Comments:         Anesthesia Quick Evaluation

## 2021-07-27 NOTE — Transfer of Care (Signed)
Immediate Anesthesia Transfer of Care Note  Patient: Kylie Saunders  Procedure(s) Performed: SIMPLE MASTECTOMY (Right) AXILLARY LYMPH NODE BIOPSY (Right)  Patient Location: PACU  Anesthesia Type:General  Level of Consciousness: drowsy  Airway & Oxygen Therapy: Patient Spontanous Breathing and Patient connected to face mask oxygen  Post-op Assessment: Report given to RN and Post -op Vital signs reviewed and stable  Post vital signs: Reviewed and stable  Last Vitals:  Vitals Value Taken Time  BP 108/61 07/27/21 1300  Temp    Pulse 87 07/27/21 1301  Resp 10 07/27/21 1301  SpO2 99 % 07/27/21 1301  Vitals shown include unvalidated device data.  Last Pain:  Vitals:   07/27/21 0931  TempSrc: Oral  PainSc: 0-No pain         Complications: No notable events documented.

## 2021-07-27 NOTE — Interval H&P Note (Signed)
History and Physical Interval Note:  07/27/2021 9:58 AM  Kylie Saunders  has presented today for surgery, with the diagnosis of right breast cancer.  The various methods of treatment have been discussed with the patient and family. After consideration of risks, benefits and other options for treatment, the patient has consented to  Procedure(s): SIMPLE MASTECTOMY (Right) AXILLARY LYMPH NODE BIOPSY (Right) as a surgical intervention.  The patient's history has been reviewed, patient examined, no change in status, stable for surgery.  I have reviewed the patient's chart and labs.  Questions were answered to the patient's satisfaction.    The right side is marked.   Ronny Bacon

## 2021-07-27 NOTE — Anesthesia Procedure Notes (Signed)
Procedure Name: LMA Insertion Date/Time: 07/27/2021 10:24 AM Performed by: Jerrye Noble, CRNA Pre-anesthesia Checklist: Patient identified, Emergency Drugs available, Patient being monitored and Suction available Patient Re-evaluated:Patient Re-evaluated prior to induction Oxygen Delivery Method: Circle system utilized Preoxygenation: Pre-oxygenation with 100% oxygen Induction Type: IV induction LMA: LMA inserted LMA Size: 3.5 Placement Confirmation: positive ETCO2 and breath sounds checked- equal and bilateral Tube secured with: Tape

## 2021-07-27 NOTE — Discharge Instructions (Signed)
AMBULATORY SURGERY  ?DISCHARGE INSTRUCTIONS ? ? ?The drugs that you were given will stay in your system until tomorrow so for the next 24 hours you should not: ? ?Drive an automobile ?Make any legal decisions ?Drink any alcoholic beverage ? ? ?You may resume regular meals tomorrow.  Today it is better to start with liquids and gradually work up to solid foods. ? ?You may eat anything you prefer, but it is better to start with liquids, then soup and crackers, and gradually work up to solid foods. ? ? ?Please notify your doctor immediately if you have any unusual bleeding, trouble breathing, redness and pain at the surgery site, drainage, fever, or pain not relieved by medication. ? ? ? ?Additional Instructions: ? ? ? ?Please contact your physician with any problems or Same Day Surgery at [phone], Monday through Friday 6 am to 4 pm, or [place] [facility] [place] Main number at [phone].  ?

## 2021-07-27 NOTE — Anesthesia Procedure Notes (Signed)
Procedure Name: Intubation Date/Time: 07/27/2021 10:32 AM Performed by: Jerrye Noble, CRNA Pre-anesthesia Checklist: Patient identified, Emergency Drugs available, Suction available and Patient being monitored Patient Re-evaluated:Patient Re-evaluated prior to induction Oxygen Delivery Method: Circle system utilized Preoxygenation: Pre-oxygenation with 100% oxygen Induction Type: IV induction Ventilation: Mask ventilation without difficulty Laryngoscope Size: McGraph and 3 Grade View: Grade II Tube type: Oral Tube size: 7.0 mm Number of attempts: 1 Airway Equipment and Method: Stylet, Oral airway and Video-laryngoscopy Placement Confirmation: ETT inserted through vocal cords under direct vision, positive ETCO2 and breath sounds checked- equal and bilateral Secured at: 23 cm Tube secured with: Tape Dental Injury: Teeth and Oropharynx as per pre-operative assessment

## 2021-07-28 ENCOUNTER — Other Ambulatory Visit: Payer: Self-pay

## 2021-07-28 ENCOUNTER — Inpatient Hospital Stay
Admission: EM | Admit: 2021-07-28 | Discharge: 2021-07-30 | DRG: 856 | Disposition: A | Discharge status: Home / Self Care | Payer: Medicare HMO | Attending: Internal Medicine | Admitting: Internal Medicine

## 2021-07-28 ENCOUNTER — Encounter: Payer: Self-pay | Admitting: Surgery

## 2021-07-28 ENCOUNTER — Emergency Department: Payer: Medicare HMO

## 2021-07-28 ENCOUNTER — Telehealth: Payer: Self-pay

## 2021-07-28 DIAGNOSIS — Z7982 Long term (current) use of aspirin: Secondary | ICD-10-CM | POA: Diagnosis not present

## 2021-07-28 DIAGNOSIS — G9341 Metabolic encephalopathy: Secondary | ICD-10-CM | POA: Diagnosis present

## 2021-07-28 DIAGNOSIS — Z170 Estrogen receptor positive status [ER+]: Secondary | ICD-10-CM

## 2021-07-28 DIAGNOSIS — J690 Pneumonitis due to inhalation of food and vomit: Secondary | ICD-10-CM | POA: Diagnosis present

## 2021-07-28 DIAGNOSIS — Z8616 Personal history of COVID-19: Secondary | ICD-10-CM | POA: Diagnosis not present

## 2021-07-28 DIAGNOSIS — S0591XA Unspecified injury of right eye and orbit, initial encounter: Secondary | ICD-10-CM | POA: Diagnosis present

## 2021-07-28 DIAGNOSIS — J189 Pneumonia, unspecified organism: Secondary | ICD-10-CM | POA: Diagnosis present

## 2021-07-28 DIAGNOSIS — E669 Obesity, unspecified: Secondary | ICD-10-CM | POA: Diagnosis present

## 2021-07-28 DIAGNOSIS — E785 Hyperlipidemia, unspecified: Secondary | ICD-10-CM | POA: Diagnosis present

## 2021-07-28 DIAGNOSIS — J9601 Acute respiratory failure with hypoxia: Secondary | ICD-10-CM | POA: Diagnosis present

## 2021-07-28 DIAGNOSIS — Z79899 Other long term (current) drug therapy: Secondary | ICD-10-CM | POA: Diagnosis not present

## 2021-07-28 DIAGNOSIS — N179 Acute kidney failure, unspecified: Secondary | ICD-10-CM | POA: Diagnosis present

## 2021-07-28 DIAGNOSIS — F200 Paranoid schizophrenia: Secondary | ICD-10-CM | POA: Diagnosis present

## 2021-07-28 DIAGNOSIS — Z8542 Personal history of malignant neoplasm of other parts of uterus: Secondary | ICD-10-CM

## 2021-07-28 DIAGNOSIS — K219 Gastro-esophageal reflux disease without esophagitis: Secondary | ICD-10-CM | POA: Diagnosis present

## 2021-07-28 DIAGNOSIS — R4182 Altered mental status, unspecified: Secondary | ICD-10-CM | POA: Diagnosis present

## 2021-07-28 DIAGNOSIS — Z6832 Body mass index (BMI) 32.0-32.9, adult: Secondary | ICD-10-CM

## 2021-07-28 DIAGNOSIS — Z90710 Acquired absence of both cervix and uterus: Secondary | ICD-10-CM

## 2021-07-28 DIAGNOSIS — R6521 Severe sepsis with septic shock: Secondary | ICD-10-CM | POA: Diagnosis present

## 2021-07-28 DIAGNOSIS — A419 Sepsis, unspecified organism: Secondary | ICD-10-CM | POA: Diagnosis present

## 2021-07-28 DIAGNOSIS — S0531XA Ocular laceration without prolapse or loss of intraocular tissue, right eye, initial encounter: Secondary | ICD-10-CM | POA: Diagnosis present

## 2021-07-28 DIAGNOSIS — E782 Mixed hyperlipidemia: Secondary | ICD-10-CM

## 2021-07-28 DIAGNOSIS — R109 Unspecified abdominal pain: Secondary | ICD-10-CM

## 2021-07-28 DIAGNOSIS — F79 Unspecified intellectual disabilities: Secondary | ICD-10-CM | POA: Diagnosis present

## 2021-07-28 DIAGNOSIS — T8144XA Sepsis following a procedure, initial encounter: Secondary | ICD-10-CM | POA: Diagnosis present

## 2021-07-28 DIAGNOSIS — C50811 Malignant neoplasm of overlapping sites of right female breast: Secondary | ICD-10-CM | POA: Diagnosis present

## 2021-07-28 LAB — CBC WITH DIFFERENTIAL/PLATELET
Abs Immature Granulocytes: 0.19 10*3/uL — ABNORMAL HIGH (ref 0.00–0.07)
Basophils Absolute: 0 10*3/uL (ref 0.0–0.1)
Basophils Relative: 0 %
Eosinophils Absolute: 0 10*3/uL (ref 0.0–0.5)
Eosinophils Relative: 0 %
HCT: 40.5 % (ref 36.0–46.0)
Hemoglobin: 12.6 g/dL (ref 12.0–15.0)
Immature Granulocytes: 1 %
Lymphocytes Relative: 5 %
Lymphs Abs: 0.9 10*3/uL (ref 0.7–4.0)
MCH: 29.4 pg (ref 26.0–34.0)
MCHC: 31.1 g/dL (ref 30.0–36.0)
MCV: 94.4 fL (ref 80.0–100.0)
Monocytes Absolute: 1 10*3/uL (ref 0.1–1.0)
Monocytes Relative: 6 %
Neutro Abs: 15.6 10*3/uL — ABNORMAL HIGH (ref 1.7–7.7)
Neutrophils Relative %: 88 %
Platelets: 236 10*3/uL (ref 150–400)
RBC: 4.29 MIL/uL (ref 3.87–5.11)
RDW: 14.2 % (ref 11.5–15.5)
WBC: 17.7 10*3/uL — ABNORMAL HIGH (ref 4.0–10.5)
nRBC: 0 % (ref 0.0–0.2)

## 2021-07-28 LAB — STREP PNEUMONIAE URINARY ANTIGEN: Strep Pneumo Urinary Antigen: NEGATIVE

## 2021-07-28 LAB — COMPREHENSIVE METABOLIC PANEL
ALT: 37 U/L (ref 0–44)
AST: 28 U/L (ref 15–41)
Albumin: 3.8 g/dL (ref 3.5–5.0)
Alkaline Phosphatase: 68 U/L (ref 38–126)
Anion gap: 12 (ref 5–15)
BUN: 20 mg/dL (ref 8–23)
CO2: 28 mmol/L (ref 22–32)
Calcium: 8.4 mg/dL — ABNORMAL LOW (ref 8.9–10.3)
Chloride: 94 mmol/L — ABNORMAL LOW (ref 98–111)
Creatinine, Ser: 1.45 mg/dL — ABNORMAL HIGH (ref 0.44–1.00)
GFR, Estimated: 41 mL/min — ABNORMAL LOW (ref >60)
Glucose, Bld: 159 mg/dL — ABNORMAL HIGH (ref 70–99)
Potassium: 4.8 mmol/L (ref 3.5–5.1)
Sodium: 134 mmol/L — ABNORMAL LOW (ref 135–145)
Total Bilirubin: 0.5 mg/dL (ref 0.3–1.2)
Total Protein: 7.3 g/dL (ref 6.5–8.1)

## 2021-07-28 LAB — URINALYSIS, COMPLETE (UACMP) WITH MICROSCOPIC
Bacteria, UA: NONE SEEN
Bilirubin Urine: NEGATIVE
Glucose, UA: NEGATIVE mg/dL
Hgb urine dipstick: NEGATIVE
Ketones, ur: NEGATIVE mg/dL
Nitrite: NEGATIVE
Protein, ur: NEGATIVE mg/dL
RBC / HPF: >50 RBC/hpf — ABNORMAL HIGH (ref 0–5)
Specific Gravity, Urine: 1.021 (ref 1.005–1.030)
Specific Gravity, Urine: 1.01 (ref 1.005–1.030)
Squamous Epithelial / HPF: NONE SEEN (ref 0–5)
pH: 5.5 (ref 5.0–8.0)

## 2021-07-28 LAB — TROPONIN I (HIGH SENSITIVITY)
Troponin I (High Sensitivity): 19 ng/L — ABNORMAL HIGH (ref <18)
Troponin I (High Sensitivity): 30 ng/L — ABNORMAL HIGH (ref <18)

## 2021-07-28 LAB — LACTIC ACID, PLASMA
Lactic Acid, Venous: 1.5 mmol/L (ref 0.5–1.9)
Lactic Acid, Venous: 2 mmol/L (ref 0.5–1.9)

## 2021-07-28 LAB — PROTIME-INR
INR: 1 (ref 0.8–1.2)
Prothrombin Time: 13.5 seconds (ref 11.4–15.2)

## 2021-07-28 LAB — RESP PANEL BY RT-PCR (FLU A&B, COVID) ARPGX2
Influenza A by PCR: NEGATIVE
Influenza B by PCR: NEGATIVE
SARS Coronavirus 2 by RT PCR: NEGATIVE

## 2021-07-28 LAB — MRSA NEXT GEN BY PCR, NASAL: MRSA by PCR Next Gen: NOT DETECTED

## 2021-07-28 LAB — BRAIN NATRIURETIC PEPTIDE: B Natriuretic Peptide: 317.5 pg/mL — ABNORMAL HIGH (ref 0.0–100.0)

## 2021-07-28 LAB — HEMOGLOBIN AND HEMATOCRIT, BLOOD
HCT: 36.8 % (ref 36.0–46.0)
Hemoglobin: 11.7 g/dL — ABNORMAL LOW (ref 12.0–15.0)

## 2021-07-28 LAB — PROCALCITONIN: Procalcitonin: 0.1 ng/mL

## 2021-07-28 LAB — APTT: aPTT: 29 seconds (ref 24–36)

## 2021-07-28 MED ORDER — SIMVASTATIN 20 MG PO TABS
40.0000 mg | ORAL_TABLET | Freq: Every day | ORAL | Status: DC
  Administered 2021-07-29: 40 mg via ORAL
  Filled 2021-07-28: qty 2

## 2021-07-28 MED ORDER — PANTOPRAZOLE SODIUM 40 MG PO TBEC
80.0000 mg | DELAYED_RELEASE_TABLET | Freq: Every day | ORAL | Status: DC
  Administered 2021-07-29 – 2021-07-30 (×2): 80 mg via ORAL
  Filled 2021-07-28 (×2): qty 2

## 2021-07-28 MED ORDER — VANCOMYCIN VARIABLE DOSE PER UNSTABLE RENAL FUNCTION (PHARMACIST DOSING): Status: DC

## 2021-07-28 MED ORDER — ACETAMINOPHEN 650 MG RE SUPP: 650.0000 mg | Freq: Four times a day (QID) | RECTAL | Status: DC | PRN

## 2021-07-28 MED ORDER — ENOXAPARIN SODIUM 40 MG/0.4ML IJ SOSY
40.0000 mg | PREFILLED_SYRINGE | INTRAMUSCULAR | Status: DC
  Administered 2021-07-28 – 2021-07-29 (×2): 40 mg via SUBCUTANEOUS
  Filled 2021-07-28 (×2): qty 0.4

## 2021-07-28 MED ORDER — VANCOMYCIN HCL IN DEXTROSE 1-5 GM/200ML-% IV SOLN: 1000.0000 mg | Freq: Once | INTRAVENOUS | Status: DC

## 2021-07-28 MED ORDER — SIMVASTATIN 10 MG PO TABS: 40.0000 mg | ORAL_TABLET | Freq: Every day | ORAL | Status: DC

## 2021-07-28 MED ORDER — ACETAMINOPHEN 325 MG PO TABS
650.0000 mg | ORAL_TABLET | Freq: Four times a day (QID) | ORAL | Status: DC | PRN
  Administered 2021-07-30: 650 mg via ORAL
  Filled 2021-07-28: qty 2

## 2021-07-28 MED ORDER — CHLORHEXIDINE GLUCONATE CLOTH 2 % EX PADS
6.0000 | MEDICATED_PAD | Freq: Every day | CUTANEOUS | Status: DC
  Administered 2021-07-28: 6 via TOPICAL
  Filled 2021-07-28: qty 6

## 2021-07-28 MED ORDER — BENZTROPINE MESYLATE 1 MG PO TABS
2.0000 mg | ORAL_TABLET | Freq: Every day | ORAL | Status: DC
  Administered 2021-07-29 – 2021-07-30 (×2): 2 mg via ORAL
  Filled 2021-07-28: qty 1
  Filled 2021-07-28: qty 2
  Filled 2021-07-28: qty 1
  Filled 2021-07-28: qty 2

## 2021-07-28 MED ORDER — VANCOMYCIN HCL 2000 MG/400ML IV SOLN
2000.0000 mg | Freq: Once | INTRAVENOUS | Status: DC
  Filled 2021-07-28: qty 400

## 2021-07-28 MED ORDER — SODIUM CHLORIDE 0.9 % IV SOLN: INTRAVENOUS | Status: AC

## 2021-07-28 MED ORDER — ONDANSETRON HCL 4 MG PO TABS: 4.0000 mg | ORAL_TABLET | Freq: Four times a day (QID) | ORAL | Status: DC | PRN

## 2021-07-28 MED ORDER — SODIUM CHLORIDE 0.9 % IV SOLN: 2.0000 g | Freq: Once | INTRAVENOUS | Status: DC

## 2021-07-28 MED ORDER — RISPERIDONE 1 MG PO TABS
2.0000 mg | ORAL_TABLET | Freq: Two times a day (BID) | ORAL | Status: DC
  Administered 2021-07-28 – 2021-07-30 (×4): 2 mg via ORAL
  Filled 2021-07-28 (×6): qty 2

## 2021-07-28 MED ORDER — ATORVASTATIN CALCIUM 20 MG PO TABS: 20.0000 mg | ORAL_TABLET | Freq: Every day | ORAL | Status: DC

## 2021-07-28 MED ORDER — AMLODIPINE BESYLATE 5 MG PO TABS: 5.0000 mg | ORAL_TABLET | Freq: Every day | ORAL | Status: DC

## 2021-07-28 MED ORDER — ONDANSETRON HCL 4 MG/2ML IJ SOLN: 4.0000 mg | Freq: Four times a day (QID) | INTRAMUSCULAR | Status: DC | PRN

## 2021-07-28 MED ORDER — SODIUM CHLORIDE 0.9 % IV SOLN
2.0000 g | Freq: Once | INTRAVENOUS | Status: AC
  Administered 2021-07-28: 2 g via INTRAVENOUS
  Filled 2021-07-28: qty 2

## 2021-07-28 MED ORDER — SODIUM CHLORIDE 0.9 % IV SOLN
2.0000 g | Freq: Two times a day (BID) | INTRAVENOUS | Status: DC
  Administered 2021-07-28 – 2021-07-29 (×3): 2 g via INTRAVENOUS
  Filled 2021-07-28 (×6): qty 2

## 2021-07-28 MED ORDER — ASPIRIN 81 MG PO CHEW
81.0000 mg | CHEWABLE_TABLET | Freq: Every day | ORAL | Status: DC
  Administered 2021-07-29 – 2021-07-30 (×2): 81 mg via ORAL
  Filled 2021-07-28 (×2): qty 1

## 2021-07-28 MED ORDER — MIDODRINE HCL 5 MG PO TABS
5.0000 mg | ORAL_TABLET | Freq: Three times a day (TID) | ORAL | Status: AC
  Administered 2021-07-28 – 2021-07-29 (×2): 5 mg via ORAL
  Filled 2021-07-28 (×2): qty 1

## 2021-07-28 MED ORDER — SODIUM CHLORIDE 0.9 % IV BOLUS
1000.0000 mL | Freq: Once | INTRAVENOUS | Status: AC
  Administered 2021-07-28: 1000 mL via INTRAVENOUS

## 2021-07-28 MED ORDER — IOHEXOL 350 MG/ML SOLN
50.0000 mL | Freq: Once | INTRAVENOUS | Status: AC | PRN
  Administered 2021-07-28: 50 mL via INTRAVENOUS

## 2021-07-28 MED ORDER — VANCOMYCIN HCL 2000 MG/400ML IV SOLN
2000.0000 mg | Freq: Once | INTRAVENOUS | Status: AC
  Administered 2021-07-28: 2000 mg via INTRAVENOUS
  Filled 2021-07-28: qty 400

## 2021-07-28 NOTE — ED Provider Notes (Signed)
[facility] Emergency Department Provider Note  ____________________________________________   Event Date/Time   First MD Initiated Contact with Patient 07/28/21 1011     (approximate)  I have reviewed the triage vital signs and the nursing notes.   HISTORY  Chief Complaint Altered Mental Status   HPI Kylie Saunders is a 60 y.o. female with past medical history of paranoid schizophrenia, remote right eye injury, obesity, intellectual disability, HTN, HDL, endometrial cancer status post hysterectomy, anxiety and breast cancer status post right-sided mastectomy yesterday who presents from her group home via EMS with concerns for unresponsiveness and altered mental status.  Per EMS patient was minimally responsive with him and they administered 1 mg of intranasal Narcan.  Following this patient seemed to be more awake.  However she did not she did not remember exactly what happened earlier this morning.  Her medications are administered by staff at her group home and should not receive any pain medications per [facility] this morning.  She does endorse mild nonproductive cough and some congestion.  On arrival denies any chest pain,, shortness of breath, abdominal pain, vomiting, diarrhea, urinary symptoms recent injuries or falls or any other acute sick symptoms.  She states she does not think she gave herself and if her pain is since morning but is not sure.  Per EMS she was hypoxic requiring 10 L nonrebreather to maintain SPO2 greater 90% in route.         Past Medical History:  Diagnosis Date   Anxiety    Cancer of body of uterus ([place]) 05/31/2012   Endometrial cancer, had hysterectomy w/BSO 07/25/12   GERD (gastroesophageal reflux disease)    History of 2019 novel coronavirus disease (COVID-19) 12/14/2020   HLD (hyperlipidemia) 07/25/2012   Hypertension    Intellectual disability 07/25/2012   Obesity    Pancreatitis due to biliary obstruction    Paranoid  schizophrenia ([place])    Right eye injury 11/03/2016    Patient Active Problem List   Diagnosis Date Noted   PNA (pneumonia) 07/28/2021   Sepsis ([place]) 07/28/2021   Aspiration pneumonia ([place]) 07/28/2021   Malignant neoplasm of overlapping sites of right breast in female, estrogen receptor positive ([place]) [id number]   Biliary colic [id number]   Ruptured globe of right eye 12/22/2016   Left upper quadrant pain    Acute pancreatitis    Abdominal pain 12/16/2016   Schizophrenia, paranoid ([place]) 11/03/2016   Right eye injury 11/03/2016   HLD (hyperlipidemia) 07/25/2012   Intellectual disability 07/25/2012   Cancer of body of uterus ([place]) 05/31/2012   Malignant neoplasm of body of uterus ([place]) 05/31/2012    Past Surgical History:  Procedure Laterality Date   ABDOMINAL HYSTERECTOMY  07/25/2012   with BSO   AXILLARY LYMPH NODE BIOPSY Right 07/27/2021   Procedure: AXILLARY LYMPH NODE BIOPSY;  Surgeon: Ronny Bacon, MD;  Location: [facility] ORS;  Service: General;  Laterality: Right;   BREAST BIOPSY Right 07/07/2021   [place] bx mass, venus marker, path pending   CHOLECYSTECTOMY N/A 01/24/2017   Procedure: LAPAROSCOPIC CHOLECYSTECTOMY WITH INTRAOPERATIVE CHOLANGIOGRAM;  Surgeon: Florene Glen, MD;  Location: [facility] ORS;  Service: General;  Laterality: N/A;   EYE SURGERY     SIMPLE MASTECTOMY WITH AXILLARY SENTINEL NODE BIOPSY Right 07/27/2021   Procedure: SIMPLE MASTECTOMY;  Surgeon: Ronny Bacon, MD;  Location: [facility] ORS;  Service: General;  Laterality: Right;   TONSILLECTOMY      Prior to Admission medications   Medication Sig Start Date  End Date Taking? Authorizing Provider  amLODipine (NORVASC) 5 MG tablet Take 5 mg by mouth daily.   Yes [provider]  aspirin 81 MG chewable tablet CHEW ONE TABLET BY MOUTH EVERY DAY 08/12/18  Yes [provider]  benztropine (COGENTIN) 2 MG tablet Take 1 tablet (2 mg total) by mouth daily. 06/26/21 09/24/21 Yes Norman Clay, MD   HYDROcodone-acetaminophen (NORCO/VICODIN) 5-325 MG tablet Take 1 tablet by mouth every 6 (six) hours as needed for moderate pain. 07/27/21  Yes Ronny Bacon, MD  lisinopril-hydrochlorothiazide (PRINZIDE,ZESTORETIC) 20-12.5 MG tablet Take 0.5 tablets by mouth daily.   Yes [provider]  loratadine (CLARITIN) 10 MG tablet Take 10 mg by mouth daily.   Yes [provider]  LORazepam (ATIVAN) 0.5 MG tablet Take 1 tablet (0.5 mg total) by mouth 2 (two) times daily. 06/26/21 09/24/21 Yes Hisada, Elie Goody, MD  omeprazole (PRILOSEC) 40 MG capsule Take 40 mg by mouth daily.   Yes [provider]  Polyvinyl Alcohol-Povidone (REFRESH OP) Apply 1 application to eye 2 (two) times daily. Right eye only   Yes [provider]  risperiDONE (RISPERDAL) 2 MG tablet Take 1 tablet (2 mg total) by mouth 2 (two) times daily. 06/26/21 09/24/21 Yes Hisada, Elie Goody, MD  simvastatin (ZOCOR) 40 MG tablet Take 40 mg by mouth daily.  05/31/12  Yes [provider]  ibuprofen (ADVIL) 800 MG tablet Take 1 tablet (800 mg total) by mouth every 8 (eight) hours as needed. 07/27/21   Ronny Bacon, MD    Allergies Patient has no known allergies.  Family History  Problem Relation Age of Onset   Diabetes Neg Hx    Hypertension Neg Hx     Social History Social History   Tobacco Use   Smoking status: Never   Smokeless tobacco: Never  Vaping Use   Vaping Use: Never used  Substance Use Topics   Alcohol use: No    Alcohol/week: 0.0 standard drinks   Drug use: No    Review of Systems  Review of Systems  Constitutional:  Negative for chills and fever.  HENT:  Positive for congestion. Negative for sore throat.   Eyes:  Negative for pain.  Respiratory:  Positive for cough. Negative for stridor.   Cardiovascular:  Negative for chest pain.  Gastrointestinal:  Negative for vomiting.  Genitourinary:  Negative for dysuria.  Skin:  Negative for rash.  Neurological:  Negative for  seizures, loss of consciousness and headaches.  Psychiatric/Behavioral:  Negative for suicidal ideas.   All other systems reviewed and are negative.   ____________________________________________   PHYSICAL EXAM:  VITAL SIGNS: ED Triage Vitals  Enc Vitals Group     BP      Pulse      Resp      Temp      Temp src      SpO2      Weight      Height      Head Circumference      Peak Flow      Pain Score      Pain Loc      Pain Edu?      Excl. in [place]?    Vitals:   07/28/21 1200 07/28/21 1330  BP: 111/82 110/79  Pulse: 85 97  Resp: 12 15  Temp:    SpO2: 95% 100%   Physical Exam Vitals and nursing note reviewed.  Constitutional:      General: She is not in acute distress.  Appearance: She is well-developed.  HENT:     Head: Normocephalic and atraumatic.  Eyes:     Conjunctiva/sclera: Conjunctivae normal.  Cardiovascular:     Rate and Rhythm: Normal rate and regular rhythm.     Heart sounds: No murmur heard. Pulmonary:     Effort: Pulmonary effort is normal. No respiratory distress.     Breath sounds: Decreased breath sounds and rhonchi present.  Abdominal:     Palpations: Abdomen is soft.     Tenderness: There is no abdominal tenderness.  Musculoskeletal:     Cervical back: Neck supple.  Skin:    General: Skin is warm and dry.  Neurological:     Mental Status: She is alert.    Drain is in place to the right breast without surrounding skin changes purulent drainage.  There is some serous reddish fluid in the drain.  Incision otherwise appears clean dry and intact.  Lungs are clear bilaterally.  Abdomen is mildly distended but soft throughout.  Patient has opacification of the right eye but otherwise cranial nerves II through XII appear grossly intact.  She will follow commands with symmetric strength in all extremities.  Sensation is intact to light touch lower extremities. ___________________ ____________________________________________   LABS (all labs  ordered are listed, but only abnormal results are displayed)  Labs Reviewed  CBC WITH DIFFERENTIAL/PLATELET - Abnormal; Notable for the following components:      Result Value   WBC 17.7 (*)    Neutro Abs 15.6 (*)    Abs Immature Granulocytes 0.19 (*)    All other components within normal limits  COMPREHENSIVE METABOLIC PANEL - Abnormal; Notable for the following components:   Sodium 134 (*)    Chloride 94 (*)    Glucose, Bld 159 (*)    Creatinine, Ser 1.45 (*)    Calcium 8.4 (*)    GFR, Estimated 41 (*)    All other components within normal limits  BRAIN NATRIURETIC PEPTIDE - Abnormal; Notable for the following components:   B Natriuretic Peptide 317.5 (*)    All other components within normal limits  TROPONIN I (HIGH SENSITIVITY) - Abnormal; Notable for the following components:   Troponin I (High Sensitivity) 19 (*)    All other components within normal limits  RESP PANEL BY RT-PCR (FLU A&B, COVID) ARPGX2  CULTURE, BLOOD (SINGLE)  MRSA NEXT GEN BY PCR, NASAL  CULTURE, BLOOD (SINGLE)  PROCALCITONIN  LACTIC ACID, PLASMA  LACTIC ACID, PLASMA  PROTIME-INR  APTT  URINALYSIS, COMPLETE (UACMP) WITH MICROSCOPIC  HIV ANTIBODY (ROUTINE TESTING W REFLEX)  URINALYSIS, COMPLETE (UACMP) WITH MICROSCOPIC  TROPONIN I (HIGH SENSITIVITY)   ____________________________________________  EKG  Sinus tachycardia with ventricular to 100, no clear evidence of acute ischemia aside from nonspecific change versus artifact in aVL.  Unremarkable intervals. ____________________________________________  RADIOLOGY  ED MD interpretation: Chest x-ray shows cardiomegaly and bilateral opacities concerning for edema versus multifocal pneumonia.  CTA chest has no evidence of PE but extensive patchy opacities bilaterally concerning for multifocal pneumonia.  There is also mild cardiomegaly and patient is known to be status post right mastectomy without evidence of complication i.e. abscess  pneumothorax.  Official radiology report(s): CT Angio Chest PE W and/or Wo Contrast  Result Date: 07/28/2021 CLINICAL DATA:  Found unresponsive and altered at group home EXAM: CT ANGIOGRAPHY CHEST WITH CONTRAST TECHNIQUE: Multidetector CT imaging of the chest was performed using the standard protocol during bolus administration of intravenous contrast. Multiplanar CT image reconstructions and MIPs were  obtained to evaluate the vascular anatomy. CONTRAST:  65m OMNIPAQUE IOHEXOL 350 MG/ML SOLN COMPARISON:  Same day chest radiograph FINDINGS: Cardiovascular: Satisfactory opacification of the pulmonary arteries to the segmental level. No evidence of pulmonary embolism. The heart is mildly enlarged. There is no pericardial effusion. Mediastinum/Nodes: The thyroid is unremarkable. The esophagus is grossly unremarkable. There is no mediastinal, hilar, or axillary lymphadenopathy. Lungs/Pleura: The trachea and central airways are patent. There is extensive patchy nodular opacity throughout the right lung. The left lung is clear. There is no pleural effusion or pneumothorax. Upper Abdomen: The imaged portions of the upper abdominal viscera are unremarkable. Musculoskeletal: There is degenerative change in both shoulders and multilevel degenerative change throughout the thoracic spine. There is no acute osseous abnormality or aggressive osseous lesion. Other: The patient is status post right mastectomy. There is a surgical drain in place. There is scattered subcutaneous air. There is no organized fluid collection. Review of the MIP images confirms the above findings. IMPRESSION: 1. No evidence of pulmonary embolism. 2. Extensive patchy nodular opacities throughout the right lung likely reflect multifocal infection/inflammation or aspiration. Given nodular appearance of many of the opacities, follow-up CT chest in 6-8 weeks is recommended to assess for resolution. 3. Mild cardiomegaly. 4. Status post right mastectomy  without evidence of complication. Electronically Signed   By: PValetta MoleM.D.   On: 07/28/2021 13:22   DG Chest Portable 1 View  Result Date: 07/28/2021 CLINICAL DATA:  SOB EXAM: PORTABLE CHEST 1 VIEW COMPARISON:  None. FINDINGS: The heart is enlarged. No large pleural effusion. Fluffy bilateral pulmonary opacities. No pneumothorax. No acute osseous abnormality. Surgical clips in the right axilla. IMPRESSION: Cardiomegaly with bilateral pulmonary opacities suggestive of at least moderate pulmonary edema. Electronically Signed   By: YAlbin FellingM.D.   On: 07/28/2021 10:56    ____________________________________________   PROCEDURES  Procedure(s) performed (including Critical Care):  .1-3 Lead EKG Interpretation Performed by: SLucrezia Starch MD Authorized by: SLucrezia Starch MD     Interpretation: normal     ECG rate assessment: normal     Rhythm: sinus rhythm     Ectopy: none     Conduction: normal     ____________________________________________   INITIAL IMPRESSION / ASSESSMENT AND PLAN / ED COURSE      Patient presents with above-stated history and exam for assessment of reportedly being unresponsive this morning at group home.  Per EMS she was hypoxic requiring nonrebreather and transfer.  On arrival patient seems more alert than she was initially per report possibly having started to some Narcan as she was rapidly transitioned to 3 L nasal cannula.  Patient states she had a little bit of a cough and congestion but no other complaints today.  She does not recall what happened earlier this morning.  Differential includes possible effects of opioid pain medicines as metabolic derangements, PE, pneumonia, heart failure ACS, pericarditis, myocarditis possible pneumothorax.  Chest x-ray shows cardiomegaly and bilateral opacities concerning for edema versus multifocal pneumonia.  CTA chest has no evidence of PE but extensive patchy opacities bilaterally concerning for  multifocal pneumonia.  There is also mild cardiomegaly and patient is known to be status post right mastectomy without evidence of complication i.e. abscess pneumothorax.  ECG and initial troponin slightly above baseline at 19 is not quite suggestive of ACS or myocarditis.  I suspect some mild demand ischemia.  BC has elevated white blood cell count 17.7 without evidence of acute anemia.  There is a  left shift.  CMP remarkable for evidence of an AKI with a creatinine of 1.45, to 0.821-day ago.  No other significant electrolyte or metabolic derangements.  COVID influenza PCR is negative.  BNP is slightly elevated at 317.  Procalcitonin 0.1.  Given hypoxic after failure patient reporting cough and congestion with leukocytosis and findings on CTA chest concerning for multifocal pneumonia suspect this is likely the etiology of her respiratory failure and cough.  We will treat as possible hospital-acquired pneumonia with vancomycin and cefepime.  Suspicion for bacteremia at this time although given leukocytosis and hypoxia we will obtain a blood culture as well.  Also order lactic acid.  Admit to medicine service for further evaluation management.      ____________________________________________   FINAL CLINICAL IMPRESSION(S) / ED DIAGNOSES  Final diagnoses:  HCAP (healthcare-associated pneumonia)  Acute respiratory failure with hypoxia ([place])    Medications  ceFEPIme (MAXIPIME) 2 g in sodium chloride 0.9 % 100 mL IVPB (has no administration in time range)  aspirin chewable tablet 81 mg (has no administration in time range)  amLODipine (NORVASC) tablet 5 mg (has no administration in time range)  simvastatin (ZOCOR) tablet 40 mg (has no administration in time range)  risperiDONE (RISPERDAL) tablet 2 mg (has no administration in time range)  pantoprazole (PROTONIX) EC tablet 80 mg (has no administration in time range)  benztropine (COGENTIN) tablet 2 mg (has no administration in time range)   enoxaparin (LOVENOX) injection 40 mg (has no administration in time range)  acetaminophen (TYLENOL) tablet 650 mg (has no administration in time range)    Or  acetaminophen (TYLENOL) suppository 650 mg (has no administration in time range)  ondansetron (ZOFRAN) tablet 4 mg (has no administration in time range)    Or  ondansetron (ZOFRAN) injection 4 mg (has no administration in time range)  vancomycin (VANCOREADY) IVPB 2000 mg/400 mL (has no administration in time range)  ceFEPIme (MAXIPIME) 2 g in sodium chloride 0.9 % 100 mL IVPB (has no administration in time range)  iohexol (OMNIPAQUE) 350 MG/ML injection 50 mL (50 mLs Intravenous Contrast Given 07/28/21 1231)     ED Discharge Orders     None        Note:  This document was prepared using Dragon voice recognition software and may include unintentional dictation errors.    Lucrezia Starch, MD 07/28/21 1359

## 2021-07-28 NOTE — ED Notes (Signed)
Pt ate 100% of dinner meal tray

## 2021-07-28 NOTE — Telephone Encounter (Signed)
Group home caregiver, Barbarann Ehlers, called and states that about an hour after picking up the patient from surgery yesterday she started getting very groggy and tired. She assumed it was from the anesthesia. She did give her one Hydrocodone pill yesterday at 6 pm but has not given her anything since then. This morning the patient was still very groggy, talking strangely, and had a very hard time standing. Concerns for some interaction from anesthesia and her medication. They have called 911 and will have the patient transported to the hospital for evaluation. I have notified Dr Christian Mate of such.

## 2021-07-28 NOTE — Progress Notes (Signed)
Date and time results received: 07/28/21 2147 (use smartphrase ".now" to insert current time)  Test: Lactic Acid Critical Value: 2.0  Name of Provider Notified: Dr. Rupert Stacks  Orders Received? Or Actions Taken?:  No new orders. Continue to monitor pt and repeat LA at 0000

## 2021-07-28 NOTE — ED Notes (Signed)
Provider at bedside

## 2021-07-28 NOTE — Consult Note (Signed)
Pharmacy Antibiotic Note  Kylie Saunders is a 60 y.o. female admitted on 07/28/2021 with AMS and pneumonia.  Pharmacy has been consulted for cefepime and vancomycin dosing. Bump in Scr 0.82 to 1.45.   Plan: Will start cefepime 2 g BIDx 7 days   Will give vancomycin 2000 mg x 1. With the bump in Scr will order a vancomycin level 24 hours post dose and dose by levels. Continue to monitor Scr. MRSA PCR ordered, if negative recommend d/c vancomycin as it has a negative predictive value of 99.6%.   Height: '5\' 2"'$  (157.5 cm) Weight: 81.2 kg (179 lb 0.2 oz) IBW/kg (Calculated) : 50.1  Temp (24hrs), Avg:97.6 F (36.4 C), Min:96.9 F (36.1 C), Max:98.4 F (36.9 C)  Recent Labs  Lab 07/27/21 0956 07/28/21 1017  WBC 8.6 17.7*  CREATININE 0.82 1.45*    Estimated Creatinine Clearance: 39.7 mL/min (A) (by C-G formula based on SCr of 1.45 mg/dL (H)).    No Known Allergies  Antimicrobials this admission: 9/8 cefepime >>  9/8 vancomycin >>   Dose adjustments this admission: none  Microbiology results: 9/8 MRSA PCR: ordered  Thank you for allowing pharmacy to be a part of this patient's care.  Oswald Hillock 07/28/2021 2:11 PM

## 2021-07-28 NOTE — Consult Note (Signed)
PHARMACY -  BRIEF ANTIBIOTIC NOTE   Pharmacy has received consult(s) for PNA from an ED provider.  The patient's profile has been reviewed for ht/wt/allergies/indication/available labs.    One time order(s) placed for cefepime and vancomycin   Further antibiotics/pharmacy consults should be ordered by admitting physician if indicated.                       Thank you, Oswald Hillock 07/28/2021  1:47 PM

## 2021-07-28 NOTE — ED Notes (Signed)
Lab called to add trop on to existing bloodwork

## 2021-07-28 NOTE — ED Notes (Signed)
Informed RN bed assigned 

## 2021-07-28 NOTE — ED Notes (Signed)
Secure chat sent to provider regarding BP

## 2021-07-28 NOTE — Consult Note (Signed)
NAME:  Kylie Saunders, MRN:  [id number], DOB:  1961-10-04, LOS: 0 ADMISSION DATE:  07/28/2021, CONSULTATION DATE:  07/28/2021 REFERRING MD:  Dr. Tobie Poet, CHIEF COMPLAINT:  Altered Mental Status   Brief Pt Description / Synopsis:  60 year old female admitted with severe sepsis, acute hypoxic respiratory failure, and acute metabolic encephalopathy in setting of healthcare associated pneumonia.  History of Present Illness:  Kylie Saunders is a 60 year old female with a past medical history significant for malignant neoplasm of the right breast status post right mastectomy on 07/27/2021: Schizophrenia, learning disability, hypertension, hyperlipidemia who presented to [facility] ED on 07/28/2021 from her group home due altered mental status (unresponsiveness).  Patient is currently awake but remains altered and unable to contribute to history, therefore history is obtained from ED and nursing notes.  She currently denies chest pain, palpitations, dizziness, shortness of breath, dysuria, edema.  She does report mild abdominal pain.  ED Course: Initial vital signs: Temperature 98.4 F, respiratory rate 11, pulse 100, blood pressure 117/76, SPO2 94% on 3 L nasal cannula Significant labs: Sodium 134, BUN 20, creatinine 1.45, glucose 159, procalcitonin 0.10, WBC 17.7, hemoglobin 12.6, platelets 236 COVID and influenza PCR negative Imaging: Chest x-ray>>Cardiomegaly with bilateral pulmonary opacities suggestive of at least moderate pulmonary edema. CTA chest>>1. No evidence of pulmonary embolism. 2. Extensive patchy nodular opacities throughout the right lung likely reflect multifocal infection/inflammation or aspiration. Given nodular appearance of many of the opacities, follow-up CT chest in 6-8 weeks is recommended to assess for resolution. 3. Mild cardiomegaly. 4. Status post right mastectomy without evidence of complication. Abdominal ultrasound>>pending  She met sepsis criteria, therefore she received  broad-spectrum IV antibiotics.  While in the ED she became hypotensive concerning for developing septic shock, of which she was given 1 L of IV fluids along with p.o. midodrine.  PCCM is asked to consult for further assistance with management of hypotension.  Pertinent  Medical History  Breast cancer status post right mastectomy on 07/27/2021 Hypertension Hyperlipidemia Obesity Pancreatitis Paranoid schizophrenia Anxiety Intellectual disability Right eye injury GERD  Micro Data:  07/28/2021: SARS-CoV-2 and influenza PCR>> negative 07/28/2021: Blood culture>> 07/28/2021: Urine>> 07/28/2021: Strep pneumo urinary antigen>> 07/28/2021: Legionella urinary antigen>> 07/28/2021: JP drain culture>>  Antimicrobials:  Cefepime 9/8>> Vancomycin 9/8>>  Significant Hospital Events: Including procedures, antibiotic start and stop dates in addition to other pertinent events   07/28/2021: Presented to ED due to altered mental status.  To be admitted for severe sepsis due to multifocal pneumonia.  Concern for developing septic shock, PCCM consulted for possible vasopressors  Interim History / Subjective:  -To be admitted by the hospitalist for severe sepsis due to pneumonia -Became hypotensive, currently receiving 1 L normal saline bolus -Concern for developing septic shock, PCCM consulted for possible vasopressors -Follow-up hemoglobin and abdominal ultrasound is pending due to increased sanguinous drainage from JP drain -Patient is more awake and alert, disoriented to time and situation  Objective   Blood pressure (!) 84/57, pulse 85, temperature 98.4 F (36.9 C), temperature source Oral, resp. rate 11, height $RemoveBe'5\' 2"'ielhjjyGY$  (1.575 m), weight 81.2 kg, SpO2 98 %.        Intake/Output Summary (Last 24 hours) at 07/28/2021 1716 Last data filed at 07/28/2021 1452 Gross per 24 hour  Intake 100 ml  Output --  Net 100 ml   Filed Weights   07/28/21 1014  Weight: 81.2 kg    Examination: General: Acute on  chronically ill-appearing female, sitting in bed, on nasal cannula, no acute distress HENT:  Atraumatic, normocephalic, neck supple, no JVD Lungs: Clear to auscultation bilaterally, no rales or wheezing noted, even, nonlabored Cardiovascular: Regular rate and rhythm, S1-S2, no murmurs, rubs, gallops Abdomen: Obese, distended, somewhat taut, no guarding or rebound tenderness, bowel sounds positive x4 Extremities: Normal bulk and tone, no deformities, no edema Neuro: Awake, alert and oriented to self and place, follows commands, no focal deficits, speech clear GU: Deferred Skin: Warm and diaphoretic.  Right breast mastectomy incision is well approximated with Dermabond, no redness, swelling, or signs of pus.  JP drain in place with sanguinous fluid, no visual evidence pus  Resolved Hospital Problem list     Assessment & Plan:   Severe sepsis due to healthcare associated pneumonia -Monitor fever curve -Trend WBC's & Procalcitonin -Follow cultures as above -Continue empiric cefepime and vancomycin pending cultures & sensitivities -Urinalysis and urine culture, JP drain culture currently needs to be collected  Hypotension, concern for developing shock vs hemorrhagic shock (as JP drain with increasing bloody drainage) Mildly elevated troponin, suspect demand ischemia. PMHx of HTN, HLD -Continuous cardiac monitoring -Maintain MAP >65 -IV fluids -Vasopressors as needed to maintain MAP goal -Continue Midodrine -Trend lactic acid until normalized -Trend HS Troponin until peaked -Echocardiogram pending -Repeat H&H -Abdominal US pending  Acute hypoxic respiratory failure in the setting of pneumonia -Supplemental O2 as needed to maintain O2 sats greater than 92% -Follow intermittent chest x-ray and ABG as needed -Antibiotics as above -Ensure pulmonary hygiene -As needed bronchodilators  Acute Kidney Injury -Monitor I&O's / urinary output -Follow BMP -Ensure adequate renal  perfusion -Avoid nephrotoxic agents as able -Replace electrolytes as indicated -IV Fluids  Acute metabolic encephalopathy in setting of severe sepsis PMHx of Schizophrenia -Treat sepsis -Provide supportive care -Avoid sedating meds as able -Continue home Risperidone and Benztropine    Best Practice (right click and "Reselect all SmartList Selections" daily)   Diet/type: Regular consistency (see orders) DVT prophylaxis: LMWH GI prophylaxis: PPI Lines: N/A Foley:  N/A Code Status:  full code Last date of multidisciplinary goals of care discussion [N/A]  Labs   CBC: Recent Labs  Lab 07/27/21 0956 07/28/21 1017  WBC 8.6 17.7*  NEUTROABS 6.0 15.6*  HGB 12.8 12.6  HCT 38.4 40.5  MCV 89.9 94.4  PLT 213 287    Basic Metabolic Panel: Recent Labs  Lab 07/27/21 0956 07/28/21 1017  NA 136 134*  K 3.5 4.8  CL 98 94*  CO2 27 28  GLUCOSE 104* 159*  BUN 12 20  CREATININE 0.82 1.45*  CALCIUM 8.9 8.4*   GFR: Estimated Creatinine Clearance: 39.7 mL/min (A) (by C-G formula based on SCr of 1.45 mg/dL (H)). Recent Labs  Lab 07/27/21 0956 07/28/21 1017 07/28/21 1413  PROCALCITON  --  0.10  --   WBC 8.6 17.7*  --   LATICACIDVEN  --   --  1.5    Liver Function Tests: Recent Labs  Lab 07/27/21 0956 07/28/21 1017  AST 14* 28  ALT 15 37  ALKPHOS 58 68  BILITOT 0.4 0.5  PROT 7.1 7.3  ALBUMIN 3.9 3.8   No results for input(s): LIPASE, AMYLASE in the last 168 hours. No results for input(s): AMMONIA in the last 168 hours.  ABG No results found for: PHART, PCO2ART, PO2ART, HCO3, TCO2, ACIDBASEDEF, O2SAT   Coagulation Profile: Recent Labs  Lab 07/28/21 1017  INR 1.0    Cardiac Enzymes: No results for input(s): CKTOTAL, CKMB, CKMBINDEX, TROPONINI in the last 168 hours.  HbA1C: No results found for:  HGBA1C  CBG: No results for input(s): GLUCAP in the last 168 hours.  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   Surgical History:   Past Surgical History:  Procedure Laterality Date   ABDOMINAL HYSTERECTOMY  07/25/2012   with BSO   AXILLARY LYMPH NODE BIOPSY Right 07/27/2021   Procedure: AXILLARY LYMPH NODE BIOPSY;  Surgeon: Ronny Bacon, MD;  Location: [facility] ORS;  Service: General;  Laterality: Right;   BREAST BIOPSY Right 07/07/2021   [place] bx mass, venus marker, path pending   CHOLECYSTECTOMY N/A 01/24/2017   Procedure: LAPAROSCOPIC CHOLECYSTECTOMY WITH INTRAOPERATIVE CHOLANGIOGRAM;  Surgeon: Florene Glen, MD;  Location: [facility] ORS;  Service: General;  Laterality: N/A;   EYE SURGERY     SIMPLE MASTECTOMY WITH AXILLARY SENTINEL NODE BIOPSY Right 07/27/2021   Procedure: SIMPLE MASTECTOMY;  Surgeon: Ronny Bacon, MD;  Location: [facility] ORS;  Service: General;  Laterality: Right;   TONSILLECTOMY       Social History:   reports that she has never smoked. She has never used smokeless tobacco. She reports that she does not drink alcohol and does not use drugs.   Family History:  Her family history is negative for Diabetes and Hypertension.   Allergies No Known Allergies   Home Medications  Prior to Admission medications   Medication Sig Start Date End Date Taking? Authorizing Provider  amLODipine (NORVASC) 5 MG tablet Take 5 mg by mouth daily.   Yes [provider]  aspirin 81 MG chewable tablet CHEW ONE TABLET BY MOUTH EVERY DAY 08/12/18  Yes [provider]  benztropine (COGENTIN) 2 MG tablet Take 1 tablet (2 mg total) by mouth daily. 06/26/21 09/24/21 Yes Norman Clay, MD   HYDROcodone-acetaminophen (NORCO/VICODIN) 5-325 MG tablet Take 1 tablet by mouth every 6 (six) hours as needed for moderate pain. 07/27/21  Yes Ronny Bacon, MD  lisinopril-hydrochlorothiazide (PRINZIDE,ZESTORETIC) 20-12.5 MG tablet Take 0.5 tablets by mouth daily.   Yes [provider]  loratadine (CLARITIN) 10 MG tablet Take 10 mg by mouth daily.   Yes [provider]  LORazepam (ATIVAN) 0.5 MG tablet Take 1 tablet (0.5 mg total) by mouth 2 (two) times daily. 06/26/21 09/24/21 Yes Hisada, Elie Goody, MD  omeprazole (PRILOSEC) 40 MG capsule Take 40 mg by mouth daily.   Yes [provider]  Polyvinyl Alcohol-Povidone (REFRESH OP) Apply 1 application to eye 2 (two) times daily. Right eye only   Yes [provider]  risperiDONE (RISPERDAL) 2 MG tablet Take 1 tablet (2 mg total) by mouth 2 (two) times daily. 06/26/21 09/24/21 Yes Hisada, Elie Goody, MD  simvastatin (ZOCOR) 40 MG tablet Take 40 mg by mouth daily.  05/31/12  Yes [provider]  ibuprofen (ADVIL) 800 MG tablet Take 1 tablet (800 mg total) by mouth every 8 (eight) hours as needed. 07/27/21   Ronny Bacon, MD     Critical care time: 17 minutes     Darel Hong, AGACNP-BC [facility] Prefer epic messenger for cross cover needs If after hours, please call E-link

## 2021-07-28 NOTE — ED Notes (Signed)
Pt presents to ED from a group home with c/o of AMS and unresponsiveness per group home staff. Pt presents to ED awake and alert and able to answer all of these RN's questions appropriately. Per EMS and pt pt had a R sided mastectomy yesterday and per EMS staff states pt has been "acting off' since then. Pt has been given narcotics per pain, pt denies taking anymore than RX'ed. EMS states pt does get tired and falls asleep if no one is talking to her, pt states "Im tired". Pt is responsive to light voice and name at this time.  Pt has JP drain to R lateral side that is draining serosanguinous fluid at this time. Pt denies pain or any complaints to this RN.   Pt arrived via [facility] at 10L/min, per EMS pt was 40% by fire dept. On arrival Pt's RA sat was 88%, pt placed on [place] at 3L/min and is maintaining a O2 sat at 93%. Pt denies feeling SOB and shows no s/s of distress at this time.

## 2021-07-28 NOTE — ED Notes (Signed)
Pt at CT

## 2021-07-28 NOTE — ED Triage Notes (Signed)
Pt comes into the ED via [facility] from group home c/o unresponsive and altered.  Per the facility she was not waking up well this morning.  Pt did have mastectomy yesterday.  Medications administered by facility.  Pt given narcan internasal.  Pt able to answer questions, but falls asleep in between. Pt is A&Ox4

## 2021-07-28 NOTE — H&P (Addendum)
History and Physical   Kylie Saunders [id number] DOB: [birth year] DOA: 07/28/2021  PCP: Novella Rob, FNP  Outpatient Specialists: Dr. Tasia Catchings Patient coming from: Group home  I have personally briefly reviewed patient's old medical records in [place].  Chief Concern: Unresponsiveness  HPI: Kylie Saunders is a 60 y.o. female with medical history significant for schizophrenia, learning disability, hypertension, hyperlipidemia, malignant neoplasm of the right breast, stage IIIb ER/PR positive, presents to the emergency from group home for chief concerns of unresponsiveness.  At bedside patient is lethargic.  Patient frequently dozes to sleep.  She did not know why she was in the hospital.  She states that she is getting surgery tomorrow.  Patient is arousable and can tell me her name, her age, location of hospital, and current calendar year.  She reports that she is not in any pain and endorses that she feels tired.  She states she does not know why she is in the hospital and she states that she needs to have surgery tomorrow.  I reminded her that she had breast surgery on 07/27/2021 and there is a drain in place.  She then states "oh, okay.  I forgot".  At bedside, she was able to tell me her name, age, current calendar year, and her current location. She denies headache. She endroses cough that started today. She denieis chills, chest pain,k shorntess of breath, abdominl apin, dysuria.   Social history: Patient lives in a group home.  Per chart review, Mr. Gayleen Blankley is the legal guardian.  Skeet Simmer is the group home staff that frequently accompanies her.  She denies tobacco, drug use, EtOH use.  Vaccination history: Unknown  ROS: Constitutional: no weight change, no fever ENT/Mouth: no sore throat, no rhinorrhea Eyes: no eye pain, no vision changes Cardiovascular: no chest pain, no dyspnea,  no edema, no palpitations Respiratory: no cough, no sputum, no  wheezing Gastrointestinal: no nausea, no vomiting, no diarrhea, no constipation Genitourinary: no urinary incontinence, no dysuria, no hematuria Musculoskeletal: no arthralgias, no myalgias Skin: no skin lesions, no pruritus, Neuro: + weakness, no loss of consciousness, no syncope Psych: no anxiety, no depression, + decrease appetite Heme/Lymph: no bruising, no bleeding  ED Course: Discussed with emergency medicine provider, patient requiring hospitalization for chief concerns of respiratory failure in setting of pneumonia.  Vitals in the emergency department was remarkable for temperature 98.4, respiration rate of 11, heart rate of 100, blood pressure 117/76, SPO2 of 94% on 3 L nasal cannula.  Serum sodium was 134, potassium 4.8, chloride 94, bicarb 28, BUN of 20, serum creatinine of 1.45, nonfasting blood glucose 159, GFR 41, procalcitonin was 0.10, WBC 17.7, hemoglobin 12.6, platelets 236.  COVID/INFLUENZA A/INFLUENZA B PCR were negative.  CTA of the chest  was read as no evidence of pulmonary embolism.  Extensive patchy nodular opacity throughout the right lung likely reflecting multifocal infection/inflammation or aspiration. Mild cardiomegaly. Status post right mastectomy without evidence of complication.  Assessment/Plan  Principal Problem:   Sepsis ([place]) Active Problems:   HLD (hyperlipidemia)   Intellectual disability   Schizophrenia, paranoid ([place])   Right eye injury   Ruptured globe of right eye   Malignant neoplasm of overlapping sites of right breast in female, estrogen receptor positive ([place])   PNA (pneumonia)   Aspiration pneumonia ([place])   # Acute hypoxic respiratory failure secondary to pneumonia # Sepsis secondary to pneumonia # Septic shock-not responsive to IV fluid # Severe sepsis with organ involvement of renal -  Vancomycin per pharmacy, cefepime per pharmacy - Blood cultures x2 ordered - Broad-spectrum antibiotics was initiated - Patient was initially  maintaining appropriate MAP however she later became hypotensive, stat normal chloride 1 L bolus was ordered along with midodrine 5 mg p.o. with meals including now ordered - Sodium chloride 150 mL/h ordered - UA has been orderedAnd needs collection, MRSA PCR ordered, procalcitonin - Body fluid culture with Gram stain from JP drain ordered, lab and nursing staff has been alerted that this body fluid culture with gram stain order has been placed and will need to be sent to lab for processing - On evaluation patient remains hypotensive with SBP of 85 while receiving bolus - Critical care service has been consulted for pressor support  # Acute kidney injury-I suspect this is secondary to sepsis, treat as above - BMP in the a.m.  # Hypotension in setting of recent mastectomy and increasing JP drainage - ICU team recommended ultrasound of the abdomen to assess for blood loss - CTA of the abdomen and pelvis was considered however patient just had CT of the chest and has received contrast in patient has acute kidney injury  # Right breast cancer - ER/PR positive - Status postmastectomy on 07/27/2021 - Outpatient follow-up with oncology/general surgery  # Mild high-sensitivity troponin elevation-I suspect this is secondary to sepsis, treat as above  # Hypertension-amlodipine 5 mg daily has not been continued due to hypotension  # Hyperlipidemia-simvastatin 40 mg nightly ordered  # Psychiatric imbalance/schizophrenia-Risperdone 2 mg twice daily, benztropine 2 mg daily  # GERD-PPI  Follow-up on: A.m. labs, blood cultures, JP drain body culture, UA, urine culture, strep pneumo in the urine  Chart reviewed.   DVT prophylaxis: Enoxaparin 40 mg subcutaneous every 24 hours Code Status: Full code Diet: Heart healthy Family Communication: No Disposition Plan: Pending clinical course Consults called: Critical care Admission status: Admit to stepdown, inpatient, telemetry ordered  Past Medical  History:  Diagnosis Date   Anxiety    Cancer of body of uterus ([place]) 05/31/2012   Endometrial cancer, had hysterectomy w/BSO 07/25/12   GERD (gastroesophageal reflux disease)    History of 2019 novel coronavirus disease (COVID-19) 12/14/2020   HLD (hyperlipidemia) 07/25/2012   Hypertension    Intellectual disability 07/25/2012   Obesity    Pancreatitis due to biliary obstruction    Paranoid schizophrenia ([place])    Right eye injury 11/03/2016   Past Surgical History:  Procedure Laterality Date   ABDOMINAL HYSTERECTOMY  07/25/2012   with BSO   AXILLARY LYMPH NODE BIOPSY Right 07/27/2021   Procedure: AXILLARY LYMPH NODE BIOPSY;  Surgeon: Ronny Bacon, MD;  Location: [facility] ORS;  Service: General;  Laterality: Right;   BREAST BIOPSY Right 07/07/2021   [place] bx mass, venus marker, path pending   CHOLECYSTECTOMY N/A 01/24/2017   Procedure: LAPAROSCOPIC CHOLECYSTECTOMY WITH INTRAOPERATIVE CHOLANGIOGRAM;  Surgeon: Florene Glen, MD;  Location: [facility] ORS;  Service: General;  Laterality: N/A;   EYE SURGERY     SIMPLE MASTECTOMY WITH AXILLARY SENTINEL NODE BIOPSY Right 07/27/2021   Procedure: SIMPLE MASTECTOMY;  Surgeon: Ronny Bacon, MD;  Location: [facility] ORS;  Service: General;  Laterality: Right;   TONSILLECTOMY     Social History:  reports that she has never smoked. She has never used smokeless tobacco. She reports that she does not drink alcohol and does not use drugs.  No Known Allergies Family History  Problem Relation Age of Onset   Diabetes Neg Hx    Hypertension Neg Hx  Family history: Family history reviewed and not pertinent  Prior to Admission medications   Medication Sig Start Date End Date Taking? Authorizing Provider  amLODipine (NORVASC) 5 MG tablet Take 5 mg by mouth daily.   Yes [provider]  aspirin 81 MG chewable tablet CHEW ONE TABLET BY MOUTH EVERY DAY 08/12/18  Yes [provider]  benztropine (COGENTIN) 2 MG tablet Take 1 tablet (2 mg total)  by mouth daily. 06/26/21 09/24/21 Yes Norman Clay, MD  HYDROcodone-acetaminophen (NORCO/VICODIN) 5-325 MG tablet Take 1 tablet by mouth every 6 (six) hours as needed for moderate pain. 07/27/21  Yes Ronny Bacon, MD  lisinopril-hydrochlorothiazide (PRINZIDE,ZESTORETIC) 20-12.5 MG tablet Take 0.5 tablets by mouth daily.   Yes [provider]  loratadine (CLARITIN) 10 MG tablet Take 10 mg by mouth daily.   Yes [provider]  LORazepam (ATIVAN) 0.5 MG tablet Take 1 tablet (0.5 mg total) by mouth 2 (two) times daily. 06/26/21 09/24/21 Yes Hisada, Elie Goody, MD  omeprazole (PRILOSEC) 40 MG capsule Take 40 mg by mouth daily.   Yes [provider]  Polyvinyl Alcohol-Povidone (REFRESH OP) Apply 1 application to eye 2 (two) times daily. Right eye only   Yes [provider]  risperiDONE (RISPERDAL) 2 MG tablet Take 1 tablet (2 mg total) by mouth 2 (two) times daily. 06/26/21 09/24/21 Yes Hisada, Elie Goody, MD  simvastatin (ZOCOR) 40 MG tablet Take 40 mg by mouth daily.  05/31/12  Yes [provider]  ibuprofen (ADVIL) 800 MG tablet Take 1 tablet (800 mg total) by mouth every 8 (eight) hours as needed. 07/27/21   Ronny Bacon, MD   Physical Exam: Vitals:   07/28/21 1014 07/28/21 1130 07/28/21 1200 07/28/21 1330  BP: 117/76 112/76 111/82 110/79  Pulse: 100 89 85 97  Resp: '11 12 12 15  '$ Temp: 98.4 F (36.9 C)     TempSrc: Oral     SpO2: 94% 94% 95% 100%  Weight: 81.2 kg     Height: '5\' 2"'$  (1.575 m)      Constitutional: appears younger than chronological age, frail, NAD, calm, comfortable Eyes: PERRL, lids and conjunctivae normal ENMT: Mucous membranes are moist. Posterior pharynx clear of any exudate or lesions. Age-appropriate dentition. Hearing appropriate Neck: normal, supple, no masses, no thyromegaly Respiratory: clear to auscultation bilaterally, no wheezing, no crackles. Normal respiratory effort. No accessory muscle use.  Cardiovascular: Regular rate and  rhythm, no murmurs / rubs / gallops. No extremity edema. 2+ pedal pulses. No carotid bruits.  Abdomen: Obese abdomen, no tenderness, no masses palpated, no hepatosplenomegaly. Bowel sounds positive.  Musculoskeletal: no clubbing / cyanosis. No joint deformity upper and lower extremities. Good ROM, no contractures, no atrophy. Normal muscle tone.  Skin: no rashes, ulcers. No induration.  Right anterior chest wound with sutures in place.  JP drain in place with approximately 25 mL of sanguinous fluid.  Negative for visual evidence of pus. Neurologic: Sensation intact. Strength 5/5 in all 4.  Psychiatric: Normal judgment and insight. Alert and oriented x 3. Normal mood.   EKG: independently reviewed, showing sinus tachycardia with rate of 100, QTc 407  Chest x-ray on Admission: I personally reviewed and I agree with radiologist reading as below.  CT Angio Chest PE W and/or Wo Contrast  Result Date: 07/28/2021 CLINICAL DATA:  Found unresponsive and altered at group home EXAM: CT ANGIOGRAPHY CHEST WITH CONTRAST TECHNIQUE: Multidetector CT imaging of the chest was performed using the standard protocol during bolus administration of intravenous contrast. Multiplanar  CT image reconstructions and MIPs were obtained to evaluate the vascular anatomy. CONTRAST:  62m OMNIPAQUE IOHEXOL 350 MG/ML SOLN COMPARISON:  Same day chest radiograph FINDINGS: Cardiovascular: Satisfactory opacification of the pulmonary arteries to the segmental level. No evidence of pulmonary embolism. The heart is mildly enlarged. There is no pericardial effusion. Mediastinum/Nodes: The thyroid is unremarkable. The esophagus is grossly unremarkable. There is no mediastinal, hilar, or axillary lymphadenopathy. Lungs/Pleura: The trachea and central airways are patent. There is extensive patchy nodular opacity throughout the right lung. The left lung is clear. There is no pleural effusion or pneumothorax. Upper Abdomen: The imaged portions of the  upper abdominal viscera are unremarkable. Musculoskeletal: There is degenerative change in both shoulders and multilevel degenerative change throughout the thoracic spine. There is no acute osseous abnormality or aggressive osseous lesion. Other: The patient is status post right mastectomy. There is a surgical drain in place. There is scattered subcutaneous air. There is no organized fluid collection. Review of the MIP images confirms the above findings. IMPRESSION: 1. No evidence of pulmonary embolism. 2. Extensive patchy nodular opacities throughout the right lung likely reflect multifocal infection/inflammation or aspiration. Given nodular appearance of many of the opacities, follow-up CT chest in 6-8 weeks is recommended to assess for resolution. 3. Mild cardiomegaly. 4. Status post right mastectomy without evidence of complication. Electronically Signed   By: PValetta MoleM.D.   On: 07/28/2021 13:22   NM Sentinel Node Inj-No Rpt (Breast)  Result Date: 07/27/2021 Sulfur Colloid was injected by the Nuclear Medicine Technologist for sentinel lymph node localization.   DG Chest Portable 1 View  Result Date: 07/28/2021 CLINICAL DATA:  SOB EXAM: PORTABLE CHEST 1 VIEW COMPARISON:  None. FINDINGS: The heart is enlarged. No large pleural effusion. Fluffy bilateral pulmonary opacities. No pneumothorax. No acute osseous abnormality. Surgical clips in the right axilla. IMPRESSION: Cardiomegaly with bilateral pulmonary opacities suggestive of at least moderate pulmonary edema. Electronically Signed   By: YAlbin FellingM.D.   On: 07/28/2021 10:56    Labs on Admission: I have personally reviewed following labs  CBC: Recent Labs  Lab 07/27/21 0956 07/28/21 1017  WBC 8.6 17.7*  NEUTROABS 6.0 15.6*  HGB 12.8 12.6  HCT 38.4 40.5  MCV 89.9 94.4  PLT 213 [id number]  Basic Metabolic Panel: Recent Labs  Lab 07/27/21 0956 07/28/21 1017  NA 136 134*  K 3.5 4.8  CL 98 94*  CO2 27 28  GLUCOSE 104* 159*  BUN 12  20  CREATININE 0.82 1.45*  CALCIUM 8.9 8.4*   GFR: Estimated Creatinine Clearance: 39.7 mL/min (A) (by C-G formula based on SCr of 1.45 mg/dL (H)).  Liver Function Tests: Recent Labs  Lab 07/27/21 0956 07/28/21 1017  AST 14* 28  ALT 15 37  ALKPHOS 58 68  BILITOT 0.4 0.5  PROT 7.1 7.3  ALBUMIN 3.9 3.8   Urine analysis:    Component Value Date/Time   COLORURINE YELLOW (A) 12/16/2016 0955   APPEARANCEUR CLEAR (A) 12/16/2016 0955   LABSPEC 1.035 (H) 12/16/2016 0955   PHURINE 5.0 12/16/2016 0[place]01/27/2018 0955   HGBUR NEGATIVE 12/16/2016 0[place]01/27/2018 0[place]01/27/2018 0955   PROTEINUR 100 (A) 12/16/2016 0955   NITRITE NEGATIVE 12/16/2016 0955   LEUKOCYTESUR TRACE (A) 12/16/2016 0955   CRITICAL CARE Performed by: [facility]  Total critical care time: 40 minutes  Critical care time was exclusive of separately billable procedures and treating  other patients.  Critical care was necessary to treat or prevent imminent or life-threatening deterioration. Severe sepsis with septic shock.  Critical care was time spent personally by me on the following activities: development of treatment plan with patient and/or surrogate as well as nursing, discussions with consultants, evaluation of patient's response to treatment, examination of patient, obtaining history from patient or surrogate, ordering and performing treatments and interventions, ordering and review of laboratory studies, ordering and review of radiographic studies, pulse oximetry and re-evaluation of patient's condition.  Dr. Tobie Poet [facility]  If 7PM-7AM, please contact overnight-coverage provider If 7AM-7PM, please contact day coverage provider [email or web address]  07/28/2021, 1:54 PM

## 2021-07-29 ENCOUNTER — Inpatient Hospital Stay
Admit: 2021-07-29 | Discharge: 2021-07-29 | Disposition: A | Discharge status: Home / Self Care | Payer: Medicare HMO | Attending: Internal Medicine | Admitting: Internal Medicine

## 2021-07-29 ENCOUNTER — Inpatient Hospital Stay: Payer: Medicare HMO

## 2021-07-29 LAB — ECHOCARDIOGRAM COMPLETE
AR max vel: 1.93 cm2
AV Area VTI: 2.42 cm2
AV Area mean vel: 2.05 cm2
AV Mean grad: 5 mmHg
AV Peak grad: 10.6 mmHg
Ao pk vel: 1.63 m/s
Area-P 1/2: 5.7 cm2
Height: 62 in
MV VTI: 2.7 cm2
S' Lateral: 3.4 cm
Weight: 2871.27 oz

## 2021-07-29 LAB — CBC
HCT: 32.3 % — ABNORMAL LOW (ref 36.0–46.0)
Hemoglobin: 10.4 g/dL — ABNORMAL LOW (ref 12.0–15.0)
MCH: 30.2 pg (ref 26.0–34.0)
MCHC: 32.2 g/dL (ref 30.0–36.0)
MCV: 93.9 fL (ref 80.0–100.0)
Platelets: 168 10*3/uL (ref 150–400)
RBC: 3.44 MIL/uL — ABNORMAL LOW (ref 3.87–5.11)
RDW: 14.4 % (ref 11.5–15.5)
WBC: 9.6 10*3/uL (ref 4.0–10.5)
nRBC: 0 % (ref 0.0–0.2)

## 2021-07-29 LAB — BASIC METABOLIC PANEL
Anion gap: 5 (ref 5–15)
BUN: 16 mg/dL (ref 8–23)
CO2: 26 mmol/L (ref 22–32)
Calcium: 7.9 mg/dL — ABNORMAL LOW (ref 8.9–10.3)
Chloride: 105 mmol/L (ref 98–111)
Creatinine, Ser: 0.82 mg/dL (ref 0.44–1.00)
GFR, Estimated: >60 mL/min (ref >60)
Glucose, Bld: 124 mg/dL — ABNORMAL HIGH (ref 70–99)
Potassium: 3.8 mmol/L (ref 3.5–5.1)
Sodium: 136 mmol/L (ref 135–145)

## 2021-07-29 LAB — LACTIC ACID, PLASMA
Lactic Acid, Venous: 1.1 mmol/L (ref 0.5–1.9)
Lactic Acid, Venous: 1.4 mmol/L (ref 0.5–1.9)

## 2021-07-29 LAB — CORTISOL-AM, BLOOD: Cortisol - AM: 16.2 ug/dL (ref 6.7–22.6)

## 2021-07-29 LAB — GLUCOSE, CAPILLARY: Glucose-Capillary: 107 mg/dL — ABNORMAL HIGH (ref 70–99)

## 2021-07-29 LAB — PROTIME-INR
INR: 1 (ref 0.8–1.2)
Prothrombin Time: 12.7 seconds (ref 11.4–15.2)

## 2021-07-29 LAB — HIV ANTIBODY (ROUTINE TESTING W REFLEX): HIV Screen 4th Generation wRfx: NONREACTIVE

## 2021-07-29 LAB — PROCALCITONIN: Procalcitonin: <0.1 ng/mL

## 2021-07-29 MED ORDER — VANCOMYCIN HCL IN DEXTROSE 1-5 GM/200ML-% IV SOLN
1000.0000 mg | INTRAVENOUS | Status: DC
  Filled 2021-07-29: qty 200

## 2021-07-29 MED ORDER — MIDODRINE HCL 5 MG PO TABS
10.0000 mg | ORAL_TABLET | ORAL | Status: AC
  Administered 2021-07-29: 10 mg via ORAL
  Filled 2021-07-29: qty 2

## 2021-07-29 NOTE — Progress Notes (Signed)
PROGRESS NOTE    Kylie Saunders  [id number] DOB: [birth year] DOA: 07/28/2021 PCP: Novella Rob, FNP   Chief complaint.  Altered mental status. Brief Narrative:  Kylie Saunders is a 60 y.o. female with medical history significant for schizophrenia, learning disability, hypertension, hyperlipidemia, malignant neoplasm of the right breast, stage IIIb ER/PR positive, presents to the emergency from group home for chief concerns of unresponsiveness. Upon arriving the hospital, patient has tachycardia, leukocytosis of 17.7, she has severe hypotension, received IV fluid bolus.  Her chest CT scan showed multilobar focal pneumonia on the right side. She was placed on broad-spectrum antibiotics including vancomycin, cefepime.    Assessment & Plan:   Principal Problem:   Sepsis ([place]) Active Problems:   HLD (hyperlipidemia)   Intellectual disability   Schizophrenia, paranoid ([place])   Right eye injury   Ruptured globe of right eye   Malignant neoplasm of overlapping sites of right breast in female, estrogen receptor positive ([place])   PNA (pneumonia)   Aspiration pneumonia ([place])   Septic shock ([place])  Acute hypoxemic respiratory failure secondary to pneumonia. Severe sepsis with septic shock secondary to pneumonia. Right sided aspiration pneumonia. Acute kidney injury secondary to sepsis. Hypotension secondary to severe sepsis. Mild elevation troponin secondary to sepsis. Acute metabolic encephalopathy secondary to sepsis. Patient blood cultures so far negative, MRSA culture negative.  Patient mental status has improved. Patient is more hemodynamically stable. I reviewed patient CT scan, condition more consistent with aspiration pneumonia.  Will obtain speech therapy evaluation. Discontinue vancomycin, continue cefepime  Patient condition is stable enough to be transferred to progressive unit.  2.  Breast cancer. Follow-up with PCP as outpatient.  3.  Paranoid schizophrenia. Intellectual  disability. Continue to follow.     DVT prophylaxis: Lovenox Code Status: Full Family Communication:  Disposition Plan:    Status is: Inpatient  Remains inpatient appropriate because:IV treatments appropriate due to intensity of illness or inability to take PO and Inpatient level of care appropriate due to severity of illness  Dispo: The patient is from: Home              Anticipated d/c is to: Home              Patient currently is not medically stable to d/c.   Difficult to place patient No        I/O last 3 completed shifts: In: 2394.9 [I.V.:1853.9; IV Piggyback:541] Out: 8 [Urine:750; Drains:180] Total I/O In: 240 [P.O.:240] Out: -      Consultants:  None  Procedures: None  Antimicrobials:  Cefepime  Subjective: Patient is more awake today, she does not have any confusion. She denies any short of breath, still on 3 L oxygen. Denies any fever or chills. No abdominal pain or nausea vomiting. No dysuria hematuria  No headache or dizziness. No chest pain or palpitation.    Objective: Vitals:   07/29/21 1000 07/29/21 1100 07/29/21 1200 07/29/21 1300  BP: (!) 110/58 117/66 137/81 131/72  Pulse: 78 79 91 85  Resp: '11 12 16 14  '$ Temp:   98.8 F (37.1 C)   TempSrc:   Oral   SpO2: 96% 94% 94% 93%  Weight:      Height:        Intake/Output Summary (Last 24 hours) at 07/29/2021 1423 Last data filed at 07/29/2021 0904 Gross per 24 hour  Intake 2634.88 ml  Output 930 ml  Net 1704.88 ml   Filed Weights   07/28/21 1014 07/28/21 2010  Weight: 81.2 kg 81.4 kg    Examination:  General exam: Appears calm and comfortable  Respiratory system: Clear to auscultation. Respiratory effort normal. Cardiovascular system: S1 & S2 heard, RRR. No JVD, murmurs, rubs, gallops or clicks. No pedal edema. Gastrointestinal system: Abdomen is nondistended, soft and nontender. No organomegaly or masses felt. Normal bowel sounds heard. Central nervous system: Alert and  oriented x3. No focal neurological deficits. Extremities: Symmetric 5 x 5 power. Skin: No rashes, lesions or ulcers Psychiatry: Judgement and insight appear normal. Mood & affect appropriate.     Data Reviewed: I have personally reviewed following labs and imaging studies  CBC: Recent Labs  Lab 07/27/21 0956 07/28/21 1017 07/28/21 2147 07/29/21 0915  WBC 8.6 17.7*  --  9.6  NEUTROABS 6.0 15.6*  --   --   HGB 12.8 12.6 11.7* 10.4*  HCT 38.4 40.5 36.8 32.3*  MCV 89.9 94.4  --  93.9  PLT 213 236  --  [id number]   Basic Metabolic Panel: Recent Labs  Lab 07/27/21 0956 07/28/21 1017 07/29/21 0915  NA 136 134* 136  K 3.5 4.8 3.8  CL 98 94* 105  CO2 '27 28 26  '$ GLUCOSE 104* 159* 124*  BUN '12 20 16  '$ CREATININE 0.82 1.45* 0.82  CALCIUM 8.9 8.4* 7.9*   GFR: Estimated Creatinine Clearance: 70.3 mL/min (by C-G formula based on SCr of 0.82 mg/dL). Liver Function Tests: Recent Labs  Lab 07/27/21 0956 07/28/21 1017  AST 14* 28  ALT 15 37  ALKPHOS 58 68  BILITOT 0.4 0.5  PROT 7.1 7.3  ALBUMIN 3.9 3.8   No results for input(s): LIPASE, AMYLASE in the last 168 hours. No results for input(s): AMMONIA in the last 168 hours. Coagulation Profile: Recent Labs  Lab 07/28/21 1017 07/29/21 0422  INR 1.0 1.0   Cardiac Enzymes: No results for input(s): CKTOTAL, CKMB, CKMBINDEX, TROPONINI in the last 168 hours. BNP (last 3 results) No results for input(s): PROBNP in the last 8760 hours. HbA1C: No results for input(s): HGBA1C in the last 72 hours. CBG: Recent Labs  Lab 07/28/21 2014  GLUCAP 107*   Lipid Profile: No results for input(s): CHOL, HDL, LDLCALC, TRIG, CHOLHDL, LDLDIRECT in the last 72 hours. Thyroid Function Tests: No results for input(s): TSH, T4TOTAL, FREET4, T3FREE, THYROIDAB in the last 72 hours. Anemia Panel: No results for input(s): VITAMINB12, FOLATE, FERRITIN, TIBC, IRON, RETICCTPCT in the last 72 hours. Sepsis Labs: Recent Labs  Lab 07/28/21 1017  07/28/21 1413 07/28/21 2147 07/29/21 0340 07/29/21 0915  PROCALCITON 0.10  --   --   --  <0.10  LATICACIDVEN  --  1.5 2.0* 1.1 1.4    Recent Results (from the past 240 hour(s))  Resp Panel by RT-PCR (Flu A&B, Covid) Nasopharyngeal Swab     Status: None   Collection Time: 07/28/21 10:17 AM   Specimen: Nasopharyngeal Swab; Nasopharyngeal(NP) swabs in vial transport medium  Result Value Ref Range Status   SARS Coronavirus 2 by RT PCR NEGATIVE NEGATIVE Final    Comment: (NOTE) SARS-CoV-2 target nucleic acids are NOT DETECTED.  The SARS-CoV-2 RNA is generally detectable in upper respiratory specimens during the acute phase of infection. The lowest concentration of SARS-CoV-2 viral copies this assay can detect is 138 copies/mL. A negative result does not preclude SARS-Cov-2 infection and should not be used as the sole basis for treatment or other patient management decisions. A negative result may occur with  improper specimen collection/handling, submission of specimen other than nasopharyngeal  swab, presence of viral mutation(s) within the areas targeted by this assay, and inadequate number of viral copies(<138 copies/mL). A negative result must be combined with clinical observations, patient history, and epidemiological information. The expected result is Negative.  Fact Sheet for Patients:  EntrepreneurPulse.com.au  Fact Sheet for Healthcare Providers:  IncredibleEmployment.be  This test is no t yet approved or cleared by the [place] FDA and  has been authorized for detection and/or diagnosis of SARS-CoV-2 by FDA under an Emergency Use Authorization (EUA). This EUA will remain  in effect (meaning this test can be used) for the duration of the COVID-19 declaration under Section 564(b)(1) of the Act, 21 U.S.C.section 360bbb-3(b)(1), unless the authorization is terminated  or revoked sooner.       Influenza A by PCR NEGATIVE NEGATIVE  Final   Influenza B by PCR NEGATIVE NEGATIVE Final    Comment: (NOTE) The Xpert Xpress SARS-CoV-2/FLU/RSV plus assay is intended as an aid in the diagnosis of influenza from Nasopharyngeal swab specimens and should not be used as a sole basis for treatment. Nasal washings and aspirates are unacceptable for Xpert Xpress SARS-CoV-2/FLU/RSV testing.  Fact Sheet for Patients: EntrepreneurPulse.com.au  Fact Sheet for Healthcare Providers: IncredibleEmployment.be  This test is not yet approved or cleared by the [place] FDA and has been authorized for detection and/or diagnosis of SARS-CoV-2 by FDA under an Emergency Use Authorization (EUA). This EUA will remain in effect (meaning this test can be used) for the duration of the COVID-19 declaration under Section 564(b)(1) of the Act, 21 U.S.C. section 360bbb-3(b)(1), unless the authorization is terminated or revoked.  Performed at [facility], [address] [place], [place] [address]   Blood culture (routine single)     Status: None (Preliminary result)   Collection Time: 07/28/21  2:13 PM   Specimen: BLOOD  Result Value Ref Range Status   Specimen Description BLOOD BLOOD LEFT ARM  Final   Special Requests   Final    BOTTLES DRAWN AEROBIC AND ANAEROBIC Blood Culture adequate volume   Culture   Final    NO GROWTH < 24 HOURS Performed at [facility], [address], [place], [place] [address]    Report Status PENDING  Incomplete  MRSA Next Gen by PCR, Nasal     Status: None   Collection Time: 07/28/21  8:09 PM   Specimen: Nasal Mucosa; Nasal Swab  Result Value Ref Range Status   MRSA by PCR Next Gen NOT DETECTED NOT DETECTED Final    Comment: (NOTE) The GeneXpert MRSA Assay (FDA approved for NASAL specimens only), is one component of a comprehensive MRSA colonization surveillance program. It is not intended to diagnose MRSA infection nor to guide or monitor  treatment for MRSA infections. Test performance is not FDA approved in patients less than 64 years old. Performed at [facility], [address] [place], [place] [address]   Culture, blood (single) w Reflex to ID Panel     Status: None (Preliminary result)   Collection Time: 07/28/21  9:47 PM   Specimen: BLOOD  Result Value Ref Range Status   Specimen Description BLOOD LEFT WRIST  Final   Special Requests   Final    BOTTLES DRAWN AEROBIC ONLY Blood Culture results may not be optimal due to an inadequate volume of blood received in culture bottles   Culture   Final    NO GROWTH < 12 HOURS Performed at [facility], [address] [place], [place]  [address]    Report Status PENDING  Incomplete         Radiology Studies: CT Angio Chest PE W and/or Wo Contrast  Result Date: 07/28/2021 CLINICAL DATA:  Found unresponsive and altered at group home EXAM: CT ANGIOGRAPHY CHEST WITH CONTRAST TECHNIQUE: Multidetector CT imaging of the chest was performed using the standard protocol during bolus administration of intravenous contrast. Multiplanar CT image reconstructions and MIPs were obtained to evaluate the vascular anatomy. CONTRAST:  27m OMNIPAQUE IOHEXOL 350 MG/ML SOLN COMPARISON:  Same day chest radiograph FINDINGS: Cardiovascular: Satisfactory opacification of the pulmonary arteries to the segmental level. No evidence of pulmonary embolism. The heart is mildly enlarged. There is no pericardial effusion. Mediastinum/Nodes: The thyroid is unremarkable. The esophagus is grossly unremarkable. There is no mediastinal, hilar, or axillary lymphadenopathy. Lungs/Pleura: The trachea and central airways are patent. There is extensive patchy nodular opacity throughout the right lung. The left lung is clear. There is no pleural effusion or pneumothorax. Upper Abdomen: The imaged portions of the upper abdominal viscera are unremarkable. Musculoskeletal: There is degenerative change in  both shoulders and multilevel degenerative change throughout the thoracic spine. There is no acute osseous abnormality or aggressive osseous lesion. Other: The patient is status post right mastectomy. There is a surgical drain in place. There is scattered subcutaneous air. There is no organized fluid collection. Review of the MIP images confirms the above findings. IMPRESSION: 1. No evidence of pulmonary embolism. 2. Extensive patchy nodular opacities throughout the right lung likely reflect multifocal infection/inflammation or aspiration. Given nodular appearance of many of the opacities, follow-up CT chest in 6-8 weeks is recommended to assess for resolution. 3. Mild cardiomegaly. 4. Status post right mastectomy without evidence of complication. Electronically Signed   By: PValetta MoleM.D.   On: 07/28/2021 13:22   UKoreaAbdomen Complete  Result Date: 07/29/2021 CLINICAL DATA:  Abdominal pain. EXAM: ABDOMEN ULTRASOUND COMPLETE COMPARISON:  Prior MRI from 12/19/2016 FINDINGS: Gallbladder: Surgically absent. Common bile duct: Diameter: 8.5 mm Liver: Normal echogenicity without focal lesion or biliary dilatation. Portal vein is patent on color Doppler imaging with normal direction of blood flow towards the liver. IVC: Normal caliber Pancreas: Sonographically unremarkable. Spleen: Normal size.  Normal echogenicity. Right Kidney: Length: 11.2 cm. Normal renal cortical thickness and echogenicity without focal lesions or hydronephrosis. Left Kidney: Length: 10.0 cm. Normal renal cortical thickness and echogenicity without focal lesions or hydronephrosis. Abdominal aorta: Normal caliber.  Atherosclerotic changes are noted. Other findings: No ascites. IMPRESSION: 1. Status post cholecystectomy.  No biliary dilatation. 2. Normal sonographic appearance of the liver, spleen, pancreas and both kidneys. Electronically Signed   By: PMarijo SanesM.D.   On: 07/29/2021 10:38   DG Chest Portable 1 View  Result Date:  07/28/2021 CLINICAL DATA:  SOB EXAM: PORTABLE CHEST 1 VIEW COMPARISON:  None. FINDINGS: The heart is enlarged. No large pleural effusion. Fluffy bilateral pulmonary opacities. No pneumothorax. No acute osseous abnormality. Surgical clips in the right axilla. IMPRESSION: Cardiomegaly with bilateral pulmonary opacities suggestive of at least moderate pulmonary edema. Electronically Signed   By: YAlbin FellingM.D.   On: 07/28/2021 10:56        Scheduled Meds:  aspirin  81 mg Oral Daily   benztropine  2 mg Oral Daily   Chlorhexidine Gluconate Cloth  6 each Topical Q0600   enoxaparin (LOVENOX) injection  40 mg Subcutaneous Q24H   pantoprazole  80 mg Oral Daily   risperiDONE  2 mg Oral BID  simvastatin  40 mg Oral q1800   Continuous Infusions:  ceFEPime (MAXIPIME) IV 2 g (07/29/21 0904)     LOS: 1 day    Time spent: 32 minutes    Sharen Hones, MD [facility]   To contact the attending provider between 7A-7P or the covering provider during after hours 7P-7A, please log into the web site [email or web address] and access using universal [place] password for that web site. If you do not have the password, please call the hospital operator.  07/29/2021, 2:23 PM

## 2021-07-29 NOTE — Progress Notes (Addendum)
Per Dr Christian Mate (surgery), said ok to leave JP drain out for now. Will continue to monitor.

## 2021-07-29 NOTE — Consult Note (Signed)
Pharmacy Antibiotic Note  Kylie Saunders is a 60 y.o. female admitted on 07/28/2021 with AMS and pneumonia.  Pharmacy has been consulted for cefepime and vancomycin dosing. Bump in Scr 0.82 to 1.45, but has now decreased back to baseline.   Plan: Vancomycin 1000 mg IV q24h Goal AUC 400-550 Expected AUC: 480 SCr used: 0.82  Cefepime 2 g IV q12h   Height: '5\' 2"'$  (157.5 cm) Weight: 81.4 kg (179 lb 7.3 oz) IBW/kg (Calculated) : 50.1  Temp (24hrs), Avg:98.8 F (37.1 C), Min:98.7 F (37.1 C), Max:98.9 F (37.2 C)  Recent Labs  Lab 07/27/21 0956 07/28/21 1017 07/28/21 1413 07/28/21 2147 07/29/21 0340 07/29/21 0915  WBC 8.6 17.7*  --   --   --  9.6  CREATININE 0.82 1.45*  --   --   --  0.82  LATICACIDVEN  --   --  1.5 2.0* 1.1 1.4     Estimated Creatinine Clearance: 70.3 mL/min (by C-G formula based on SCr of 0.82 mg/dL).    No Known Allergies  Antimicrobials this admission: 9/8 cefepime >>  9/8 vancomycin >>    Microbiology results: 9/8 Bcx: NG 9/8 Ucx: pending 9/8 MRSA PCR: negative  Thank you for allowing pharmacy to be a part of this patient's care.  Tawnya Crook, PharmD, BCPS Clinical Pharmacist 07/29/2021 1:44 PM

## 2021-07-29 NOTE — Progress Notes (Signed)
[facility] Progress Notes-no charge  Reviewed patient's vitals and saw the blood pressure was 80/38, with SPO2 of 86% - Called ICU and spoke with nursing staff - Per nursing staff at bedside they were able to convince patient to wear her 2 L nasal cannula - Repeat blood pressure was 105/66 with a MAP of 79 - Advised nursing staff to please give midodrine and discussed with ICU provider if patient's blood pressure continues to decline  Dr. Tobie Poet

## 2021-07-29 NOTE — Progress Notes (Signed)
Pt is asking for black shoes she came in with but none were brought up w/her from ED... called ED to see if they have seen it and was told they will look for it and call me back

## 2021-07-29 NOTE — Progress Notes (Signed)
Pt refuses to wear O2... sts it's uncomfortable and keeps yelling at me and asking for her shoes... reminded pt she only came up with her pants, shirt, underwear and bra... pt is upset and cursing at me.   Rec'd call from ED earlier and they don't have any shoes that belong to the pt.   Charge nurse aware

## 2021-07-29 NOTE — Anesthesia Postprocedure Evaluation (Signed)
Anesthesia Post Note  Patient: Kylie Saunders  Procedure(s) Performed: SIMPLE MASTECTOMY (Right) AXILLARY LYMPH NODE BIOPSY (Right)  Patient location during evaluation: PACU Anesthesia Type: General Level of consciousness: awake and alert and oriented Pain management: pain level controlled Vital Signs Assessment: post-procedure vital signs reviewed and stable Respiratory status: spontaneous breathing, nonlabored ventilation and respiratory function stable Cardiovascular status: blood pressure returned to baseline and stable Postop Assessment: no signs of nausea or vomiting Anesthetic complications: no   No notable events documented.   Last Vitals:  Vitals:   07/27/21 1445 07/27/21 1500  BP: 107/72 133/78  Pulse: 70 73  Resp: 12 16  Temp:  (!) 36.1 C  SpO2: 99% 93%    Last Pain:  Vitals:   07/28/21 0827  TempSrc:   PainSc: 0-No pain                 Manvir Thorson

## 2021-07-29 NOTE — Progress Notes (Signed)
Pt arrived to floor at 1604 alert and oriented x4. Pt had been transferred to PCU bed in ICU and brought over. Pt found with right JP drain pulled out upon initial assessment. No bleeding at this site. Gauze dressing intact. Dr Roosevelt Locks made aware. Waiting for orders. Will continue to monitor. Pt orineted to room, call light within reach and bed in lowest position.

## 2021-07-29 NOTE — Progress Notes (Signed)
*  PRELIMINARY RESULTS* Echocardiogram 2D Echocardiogram has been performed.  Kylie Saunders 07/29/2021, 2:00 PM

## 2021-07-29 NOTE — Progress Notes (Signed)
Notified Rufina Falco NP in re: to pt's green color urine.... will wait for further instructions.

## 2021-07-30 LAB — URINE CULTURE: Culture: NO GROWTH

## 2021-07-30 LAB — BASIC METABOLIC PANEL
Anion gap: 4 — ABNORMAL LOW (ref 5–15)
BUN: 12 mg/dL (ref 8–23)
CO2: 30 mmol/L (ref 22–32)
Calcium: 8 mg/dL — ABNORMAL LOW (ref 8.9–10.3)
Chloride: 106 mmol/L (ref 98–111)
Creatinine, Ser: 0.66 mg/dL (ref 0.44–1.00)
GFR, Estimated: >60 mL/min (ref >60)
Glucose, Bld: 99 mg/dL (ref 70–99)
Potassium: 3.6 mmol/L (ref 3.5–5.1)
Sodium: 140 mmol/L (ref 135–145)

## 2021-07-30 LAB — CBC WITH DIFFERENTIAL/PLATELET
Abs Immature Granulocytes: 0.03 10*3/uL (ref 0.00–0.07)
Basophils Absolute: 0 10*3/uL (ref 0.0–0.1)
Basophils Relative: 0 %
Eosinophils Absolute: 0.4 10*3/uL (ref 0.0–0.5)
Eosinophils Relative: 4 %
HCT: 32.1 % — ABNORMAL LOW (ref 36.0–46.0)
Hemoglobin: 10.2 g/dL — ABNORMAL LOW (ref 12.0–15.0)
Immature Granulocytes: 0 %
Lymphocytes Relative: 18 %
Lymphs Abs: 1.7 10*3/uL (ref 0.7–4.0)
MCH: 29.5 pg (ref 26.0–34.0)
MCHC: 31.8 g/dL (ref 30.0–36.0)
MCV: 92.8 fL (ref 80.0–100.0)
Monocytes Absolute: 0.9 10*3/uL (ref 0.1–1.0)
Monocytes Relative: 9 %
Neutro Abs: 6.6 10*3/uL (ref 1.7–7.7)
Neutrophils Relative %: 69 %
Platelets: 195 10*3/uL (ref 150–400)
RBC: 3.46 MIL/uL — ABNORMAL LOW (ref 3.87–5.11)
RDW: 14.2 % (ref 11.5–15.5)
WBC: 9.6 10*3/uL (ref 4.0–10.5)
nRBC: 0 % (ref 0.0–0.2)

## 2021-07-30 LAB — MYCOPLASMA PNEUMONIAE ANTIBODY, IGM: Mycoplasma pneumo IgM: <770 U/mL (ref 0–769)

## 2021-07-30 LAB — PHOSPHORUS: Phosphorus: 1.8 mg/dL — ABNORMAL LOW (ref 2.5–4.6)

## 2021-07-30 LAB — MAGNESIUM: Magnesium: 1.9 mg/dL (ref 1.7–2.4)

## 2021-07-30 MED ORDER — POTASSIUM PHOSPHATES 15 MMOLE/5ML IV SOLN
30.0000 mmol | Freq: Once | INTRAVENOUS | Status: AC
  Administered 2021-07-30: 30 mmol via INTRAVENOUS
  Filled 2021-07-30: qty 10

## 2021-07-30 MED ORDER — SODIUM CHLORIDE 0.9 % IV SOLN
2.0000 g | Freq: Three times a day (TID) | INTRAVENOUS | Status: DC
  Filled 2021-07-30 (×2): qty 2

## 2021-07-30 MED ORDER — AMOXICILLIN-POT CLAVULANATE 875-125 MG PO TABS
1.0000 | ORAL_TABLET | Freq: Two times a day (BID) | ORAL | Status: DC
  Administered 2021-07-30: 1 via ORAL
  Filled 2021-07-30 (×2): qty 1

## 2021-07-30 MED ORDER — AMOXICILLIN-POT CLAVULANATE 875-125 MG PO TABS: 1.0000 | ORAL_TABLET | Freq: Two times a day (BID) | ORAL | 0 refills | Status: AC

## 2021-07-30 NOTE — Progress Notes (Signed)
Bedside swallow eval today. No s/s of aspiration. Pt was little impulsive making her at risk for aspiration. She responded well with cues to slow down and not talk before swallowing. No s/s of aspiration today, but Pt admits to "choking" when she drinks fast. Rec. Continue with regular diet. Cues to eat slowly. Pt reports she always eats with other people and not usually alone. Report to follow. Plans for discharge back to group home today. NO other ST needs unless further dysphagia is noted.

## 2021-07-30 NOTE — Consult Note (Signed)
Pharmacy Antibiotic Note  Kylie Saunders is a 60 y.o. female admitted on 07/28/2021 with AMS and pneumonia.  Pharmacy has been consulted for cefepime dosing.   Plan:  Adjust cefepime to 2 g IV q8h given improvement in CrCl   Height: '5\' 2"'$  (157.5 cm) Weight: 81.4 kg (179 lb 7.3 oz) IBW/kg (Calculated) : 50.1  Temp (24hrs), Avg:99.1 F (37.3 C), Min:98.7 F (37.1 C), Max:100.1 F (37.8 C)  Recent Labs  Lab 07/27/21 0956 07/28/21 1017 07/28/21 1413 07/28/21 2147 07/29/21 0340 07/29/21 0915 07/30/21 0426  WBC 8.6 17.7*  --   --   --  9.6 9.6  CREATININE 0.82 1.45*  --   --   --  0.82 0.66  LATICACIDVEN  --   --  1.5 2.0* 1.1 1.4  --      Estimated Creatinine Clearance: 72.1 mL/min (by C-G formula based on SCr of 0.66 mg/dL).    No Known Allergies  Antimicrobials this admission: Cefazolin 9/7 x 1 Vancomycin 9/8 x 1 Cefepime 9/8 >>  Microbiology results: 9/8 Bcx: NGTD 9/8 Ucx: NG 9/8 MRSA PCR: negative  Thank you for allowing pharmacy to be a part of this patient's care.  Benita Gutter 07/30/2021 9:53 AM

## 2021-07-30 NOTE — Consult Note (Signed)
Subjective:   CC: post op wound care  HPI:  Kylie Saunders is a 60 y.o. female who was consulted by Christian Mate for evaluation of above.  POD#3 right simple mastectomy and sentinel lymph node biopsy.  Admitted to hospital for PNA.  Drain noted to be accidentally removed.  Surgery consulted to eval mastectomy site.  Pt has no complaints today.   Past Medical History:  has a past medical history of Anxiety, Cancer of body of uterus ([place]) (05/31/2012), GERD (gastroesophageal reflux disease), History of 2019 novel coronavirus disease (COVID-19) (12/14/2020), HLD (hyperlipidemia) (07/25/2012), Hypertension, Intellectual disability (07/25/2012), Obesity, Pancreatitis due to biliary obstruction, Paranoid schizophrenia ([place]), and Right eye injury (11/03/2016).  Past Surgical History:  has a past surgical history that includes Abdominal hysterectomy (07/25/2012); Eye surgery; Tonsillectomy; Cholecystectomy (N/A, 01/24/2017); Breast biopsy (Right, 07/07/2021); Simple mastectomy with axillary sentinel node biopsy (Right, 07/27/2021); and Axillary lymph node biopsy (Right, 07/27/2021).  Family History: reviewed and not relevant to CC.  Social History:  reports that she has never smoked. She has never used smokeless tobacco. She reports that she does not drink alcohol and does not use drugs.  Current Medications:  Prior to Admission medications   Medication Sig Start Date End Date Taking? Authorizing Provider  amLODipine (NORVASC) 5 MG tablet Take 5 mg by mouth daily.   Yes [provider]  aspirin 81 MG chewable tablet CHEW ONE TABLET BY MOUTH EVERY DAY 08/12/18  Yes [provider]  benztropine (COGENTIN) 2 MG tablet Take 1 tablet (2 mg total) by mouth daily. 06/26/21 09/24/21 Yes Norman Clay, MD  HYDROcodone-acetaminophen (NORCO/VICODIN) 5-325 MG tablet Take 1 tablet by mouth every 6 (six) hours as needed for moderate pain. 07/27/21  Yes Ronny Bacon, MD  lisinopril-hydrochlorothiazide  (PRINZIDE,ZESTORETIC) 20-12.5 MG tablet Take 0.5 tablets by mouth daily.   Yes [provider]  loratadine (CLARITIN) 10 MG tablet Take 10 mg by mouth daily.   Yes [provider]  LORazepam (ATIVAN) 0.5 MG tablet Take 1 tablet (0.5 mg total) by mouth 2 (two) times daily. 06/26/21 09/24/21 Yes Hisada, Elie Goody, MD  omeprazole (PRILOSEC) 40 MG capsule Take 40 mg by mouth daily.   Yes [provider]  Polyvinyl Alcohol-Povidone (REFRESH OP) Apply 1 application to eye 2 (two) times daily. Right eye only   Yes [provider]  risperiDONE (RISPERDAL) 2 MG tablet Take 1 tablet (2 mg total) by mouth 2 (two) times daily. 06/26/21 09/24/21 Yes Hisada, Elie Goody, MD  simvastatin (ZOCOR) 40 MG tablet Take 40 mg by mouth daily.  05/31/12  Yes [provider]  ibuprofen (ADVIL) 800 MG tablet Take 1 tablet (800 mg total) by mouth every 8 (eight) hours as needed. 07/27/21   Ronny Bacon, MD    Allergies:  No Known Allergies  ROS:  General: Denies weight loss, weight gain, fatigue, fevers, chills, and night sweats. Eyes: Denies blurry vision, double vision, eye pain, itchy eyes, and tearing. Ears: Denies hearing loss, earache, and ringing in ears. Nose: Denies sinus pain, congestion, infections, runny nose, and nosebleeds. Mouth/throat: Denies hoarseness, sore throat, bleeding gums, and difficulty swallowing. Heart: Denies chest pain, palpitations, racing heart, irregular heartbeat, leg pain or swelling, and decreased activity tolerance. Respiratory: Denies breathing difficulty, shortness of breath, wheezing, cough, and sputum. GI: Denies change in appetite, heartburn, nausea, vomiting, constipation, diarrhea, and blood in stool. GU: Denies difficulty urinating, pain with urinating, urgency, frequency, blood in urine. Musculoskeletal: Denies joint stiffness, pain, swelling, muscle weakness. Skin: Denies rash, itching, mass,  tumors, sores, and boils Neurologic: Denies  headache, fainting, dizziness, seizures, numbness, and tingling. Psychiatric: Denies depression, anxiety, difficulty sleeping, and memory loss. Endocrine: Denies heat or cold intolerance, and increased thirst or urination. Blood/lymph: Denies easy bruising, easy bruising, and swollen glands     Objective:     BP 120/63 (BP Location: Left Wrist)   Pulse 71   Temp 98.9 F (37.2 C) (Oral)   Resp 18   Ht '5\' 2"'$  (1.575 m)   Wt 81.4 kg   SpO2 95%   BMI 32.82 kg/m   Constitutional :  alert, cooperative, appears stated age, and no distress  Lymphatics/Throat:  no asymmetry, masses, or scars  Respiratory:  clear to auscultation bilaterally  Cardiovascular:  regular rate and rhythm  Gastrointestinal: soft, non-tender; bowel sounds normal; no masses,  no organomegaly.  Musculoskeletal: Steady gait and movement  Skin: Cool and moist, right mastectomy site surgical scar c/d/I, former JP site already healing with no active drainage.  Old serosanguinous discharge noted on dressing.  Mastectomy site with no evidence of residual fluid collection, induration, erythema, TTP to indicate infection or other issues at this time.  Psychiatric: Normal affect, non-agitated, not confused       LABS:  CMP Latest Ref Rng & Units 07/30/2021 07/29/2021 07/28/2021  Glucose 70 - 99 mg/dL 99 124(H) 159(H)  BUN 8 - 23 mg/dL '12 16 20  '$ Creatinine 0.44 - 1.00 mg/dL 0.66 0.82 1.45(H)  Sodium 135 - 145 mmol/L 140 136 134(L)  Potassium 3.5 - 5.1 mmol/L 3.6 3.8 4.8  Chloride 98 - 111 mmol/L 106 105 94(L)  CO2 22 - 32 mmol/L '30 26 28  '$ Calcium 8.9 - 10.3 mg/dL 8.0(L) 7.9(L) 8.4(L)  Total Protein 6.5 - 8.1 g/dL - - 7.3  Total Bilirubin 0.3 - 1.2 mg/dL - - 0.5  Alkaline Phos 38 - 126 U/L - - 68  AST 15 - 41 U/L - - 28  ALT 0 - 44 U/L - - 37   CBC Latest Ref Rng & Units 07/30/2021 07/29/2021 07/28/2021  WBC 4.0 - 10.5 K/uL 9.6 9.6 -  Hemoglobin 12.0 - 15.0 g/dL 10.2(L) 10.4(L) 11.7(L)  Hematocrit 36.0 - 46.0 % 32.1(L)  32.3(L) 36.8  Platelets 150 - 400 K/uL 195 168 -    RADS: N/a  Assessment:      S/p right simple mastectomy, SLNB POD#3 PNA  Plan:     Wound site healing well.  No concerns for fluid collection or other complications at this time.  Path report still pending.  Former drain site dressed with 4x4 and paper tape by myself.  Instructed patient to change daily until area completely healed.  She can f/u with Dr. Forest Becker office after d/c to discuss final path report once available.    PNA- continue care per hospitalist team as needed.

## 2021-07-30 NOTE — Progress Notes (Signed)
Patient to discharge back to group home today. CSW spoke with group home owner Jaclyn Prime who reports patient can return as long as medications are given to last her until Monday. Charge RN aware and is working on getting medications sent with patient.   Patient's son Saralyn Pilar updated on plan for patient to return to group home via EMS today with medication to last until Monday. No questions or concerns at this time.   CSW has printed EMS packet to the floor. Charge RN given number to call EMS once patient is ready at [phone] and the address patient will be going to is   [place] [address]  If any changes in medications/care [place] requests RN call report at discharge to [phone].   [place], [place]

## 2021-07-30 NOTE — Progress Notes (Signed)
Patient returned to group home via ems.  Kylie Saunders (caregiver)called and made aware. Report  called and given to group home.  EMS given po antibiotic doses to give to group home for tonight and tomorrow (due to pharmacy being closed until Monday)

## 2021-07-30 NOTE — Discharge Summary (Signed)
Physician Discharge Summary  Patient ID: Kylie Saunders MRN: [id number] DOB/AGE: September 17, 1961 60 y.o.  Admit date: 07/28/2021 Discharge date: 07/30/2021  Admission Diagnoses:  Discharge Diagnoses:  Principal Problem:   Sepsis ([place]) Active Problems:   HLD (hyperlipidemia)   Intellectual disability   Schizophrenia, paranoid ([place])   Right eye injury   Ruptured globe of right eye   Malignant neoplasm of overlapping sites of right breast in female, estrogen receptor positive ([place])   PNA (pneumonia)   Aspiration pneumonia ([place])   Septic shock ([place])   Discharged Condition: good  Hospital Course:  Devinne Neyer is a 60 y.o. female with medical history significant for schizophrenia, learning disability, hypertension, hyperlipidemia, malignant neoplasm of the right breast, stage IIIb ER/PR positive, presents to the emergency from group home for chief concerns of unresponsiveness. Upon arriving the hospital, patient has tachycardia, leukocytosis of 17.7, she has severe hypotension, received IV fluid bolus.  Her chest CT scan showed multilobar focal pneumonia on the right side. She was placed on broad-spectrum antibiotics including vancomycin, cefepime.     Acute hypoxemic respiratory failure secondary to pneumonia. Severe sepsis with septic shock secondary to pneumonia. Right sided aspiration pneumonia. Acute kidney injury secondary to sepsis. Hypotension secondary to severe sepsis. Mild elevation troponin secondary to sepsis. Acute metabolic encephalopathy secondary to sepsis. Patient blood cultures negative, MRSA culture negative.  Patient mental status has improved. I reviewed patient CT scan, condition more consistent with aspiration pneumonia.  Patient was initially treated with vancomycin and cefepime, at discharge, I will treat her with additional 5 days of Augmentin.   2.  Breast cancer. Follow-up with PCP as outpatient. Patient was also seen by general surgery for recent  mastectomy with a drain.  Drain was removed.  Patient be followed with general surgery as scheduled.  3.  Paranoid schizophrenia. Intellectual disability.     Consults: general surgery  Significant Diagnostic Studies:  CT ANGIOGRAPHY CHEST WITH CONTRAST   TECHNIQUE: Multidetector CT imaging of the chest was performed using the standard protocol during bolus administration of intravenous contrast. Multiplanar CT image reconstructions and MIPs were obtained to evaluate the vascular anatomy.   CONTRAST:  54m OMNIPAQUE IOHEXOL 350 MG/ML SOLN   COMPARISON:  Same day chest radiograph   FINDINGS: Cardiovascular: Satisfactory opacification of the pulmonary arteries to the segmental level. No evidence of pulmonary embolism. The heart is mildly enlarged. There is no pericardial effusion.   Mediastinum/Nodes: The thyroid is unremarkable. The esophagus is grossly unremarkable. There is no mediastinal, hilar, or axillary lymphadenopathy.   Lungs/Pleura: The trachea and central airways are patent.   There is extensive patchy nodular opacity throughout the right lung. The left lung is clear. There is no pleural effusion or pneumothorax.   Upper Abdomen: The imaged portions of the upper abdominal viscera are unremarkable.   Musculoskeletal: There is degenerative change in both shoulders and multilevel degenerative change throughout the thoracic spine. There is no acute osseous abnormality or aggressive osseous lesion.   Other: The patient is status post right mastectomy. There is a surgical drain in place. There is scattered subcutaneous air. There is no organized fluid collection.   Review of the MIP images confirms the above findings.   IMPRESSION: 1. No evidence of pulmonary embolism. 2. Extensive patchy nodular opacities throughout the right lung likely reflect multifocal infection/inflammation or aspiration. Given nodular appearance of many of the opacities, follow-up  CT chest in 6-8 weeks is recommended to assess for resolution. 3. Mild cardiomegaly. 4. Status post right  mastectomy without evidence of complication.     Electronically Signed   By: Valetta Mole M.D.   On: 07/28/2021 13:22      Treatments: Antibiotics  Discharge Exam: Blood pressure 120/63, pulse 71, temperature 98.9 F (37.2 C), temperature source Oral, resp. rate 18, height '5\' 2"'$  (1.575 m), weight 81.4 kg, SpO2 95 %. General appearance: alert and cooperative Resp: clear to auscultation bilaterally Cardio: regular rate and rhythm, S1, S2 normal, no murmur, click, rub or gallop GI: soft, non-tender; bowel sounds normal; no masses,  no organomegaly Extremities: extremities normal, atraumatic, no cyanosis or edema  Disposition: Discharge disposition: 01-Home or Self Care       Discharge Instructions     Diet - low sodium heart healthy   Complete by: As directed    Discharge wound care:   Complete by: As directed    Surgery follow up as scheduled   Increase activity slowly   Complete by: As directed       Allergies as of 07/30/2021   No Known Allergies      Medication List     TAKE these medications    amLODipine 5 MG tablet Commonly known as: NORVASC Take 5 mg by mouth daily.   amoxicillin-clavulanate 875-125 MG tablet Commonly known as: Augmentin Take 1 tablet by mouth 2 (two) times daily for 5 days.   aspirin 81 MG chewable tablet CHEW ONE TABLET BY MOUTH EVERY DAY   benztropine 2 MG tablet Commonly known as: COGENTIN Take 1 tablet (2 mg total) by mouth daily.   HYDROcodone-acetaminophen 5-325 MG tablet Commonly known as: NORCO/VICODIN Take 1 tablet by mouth every 6 (six) hours as needed for moderate pain.   ibuprofen 800 MG tablet Commonly known as: ADVIL Take 1 tablet (800 mg total) by mouth every 8 (eight) hours as needed.   lisinopril-hydrochlorothiazide 20-12.5 MG tablet Commonly known as: ZESTORETIC Take 0.5 tablets by mouth daily.    loratadine 10 MG tablet Commonly known as: CLARITIN Take 10 mg by mouth daily.   LORazepam 0.5 MG tablet Commonly known as: ATIVAN Take 1 tablet (0.5 mg total) by mouth 2 (two) times daily.   omeprazole 40 MG capsule Commonly known as: PRILOSEC Take 40 mg by mouth daily.   REFRESH OP Apply 1 application to eye 2 (two) times daily. Right eye only   risperiDONE 2 MG tablet Commonly known as: RisperDAL Take 1 tablet (2 mg total) by mouth 2 (two) times daily.   simvastatin 40 MG tablet Commonly known as: ZOCOR Take 40 mg by mouth daily.               Discharge Care Instructions  (From admission, onward)           Start     Ordered   07/30/21 0000  Discharge wound care:       Comments: Surgery follow up as scheduled   07/30/21 1041            Follow-up Information     Ronny Bacon, MD Follow up.   Specialty: General Surgery Why: keep original post op appointment from recent surgery to discuss pathology report and have another wound check Contact information: [address] [place] [address] [phone]         Novella Rob, FNP Follow up in 1 week(s).   Specialty: Family Medicine Contact information: [address] [place]  [address] [phone]  32 minutes Signed: Sharen Hones 07/30/2021, 10:41 AM

## 2021-07-30 NOTE — Discharge Instructions (Signed)
Removal, Care After This sheet gives you information about how to care for yourself after your procedure. Your health care provider may also give you more specific instructions. If you have problems or questions, contact your health care provider. What can I expect after the procedure? After the procedure, it is common to have: Soreness. Bruising. Itching. Follow these instructions at home: site care Follow instructions from your health care provider about how to take care of your site. Make sure you: CHANGE DRESSING OVER FORMER DRESSING SITE DAILY UNTIL HEALED.  OK TO SHOWER.  DRESSING CAN BE SWITCHED TO BAND-AID UNTIL ONCE HOME. Wash your hands with soap and water before and after you change your bandage (dressing). If soap and water are not available, use hand sanitizer. Leave stitches (sutures), skin glue, or adhesive strips in place. These skin closures may need to stay in place for 2 weeks or longer. If adhesive strip edges start to loosen and curl up, you may trim the loose edges. Do not remove adhesive strips completely unless your health care provider tells you to do that. If the area bleeds or bruises, apply gentle pressure for 10 minutes.   Check your site every day for signs of infection. Check for: Redness, swelling, or pain. Fluid or blood. Warmth. Pus or a bad smell.  General instructions Rest and then return to your normal activities as told by your health care provider. Keep all follow-up visits as told by your health care provider. This is important.  Contact a health care provider if: You have redness, swelling, or pain around your site. You have fluid or blood coming from your site. Your site feels warm to the touch. You have pus or a bad smell coming from your site. You have a fever. Your sutures, skin glue, or adhesive strips loosen or come off sooner than expected. Get help right away if: You have bleeding that does not stop with pressure or a  dressing. Summary After the procedure, it is common to have some soreness, bruising, and itching at the site. Follow instructions from your health care provider about how to take care of your site. Check your site every day for signs of infection. Contact a health care provider if you have redness, swelling, or pain around your site, or your site feels warm to the touch. Keep all follow-up visits as told by your health care provider. This is important. This information is not intended to replace advice given to you by your health care provider. Make sure you discuss any questions you have with your health care provider. Document Released: 12/03/2015 Document Revised: 05/06/2018 Document Reviewed: 05/06/2018 Elsevier Interactive Patient Education  [employer].

## 2021-07-30 NOTE — Evaluation (Signed)
Clinical/Bedside Swallow Evaluation Patient Details  Name: Kylie Saunders MRN: [id number] Date of Birth: January 22, 1961  Today's Date: 07/30/2021 Time: SLP Start Time (ACUTE ONLY): [id number] SLP Stop Time (ACUTE ONLY): 1115 SLP Time Calculation (min) (ACUTE ONLY): 30 min  Past Medical History:  Past Medical History:  Diagnosis Date   Anxiety    Cancer of body of uterus ([place]) 05/31/2012   Endometrial cancer, had hysterectomy w/BSO 07/25/12   GERD (gastroesophageal reflux disease)    History of 2019 novel coronavirus disease (COVID-19) 12/14/2020   HLD (hyperlipidemia) 07/25/2012   Hypertension    Intellectual disability 07/25/2012   Obesity    Pancreatitis due to biliary obstruction    Paranoid schizophrenia ([place])    Right eye injury 11/03/2016   Past Surgical History:  Past Surgical History:  Procedure Laterality Date   ABDOMINAL HYSTERECTOMY  07/25/2012   with BSO   AXILLARY LYMPH NODE BIOPSY Right 07/27/2021   Procedure: AXILLARY LYMPH NODE BIOPSY;  Surgeon: Ronny Bacon, MD;  Location: [facility];  Service: General;  Laterality: Right;   BREAST BIOPSY Right 07/07/2021   [place] bx mass, venus marker, path pending   CHOLECYSTECTOMY N/A 01/24/2017   Procedure: LAPAROSCOPIC CHOLECYSTECTOMY WITH INTRAOPERATIVE CHOLANGIOGRAM;  Surgeon: Florene Glen, MD;  Location: [facility];  Service: General;  Laterality: N/A;   EYE SURGERY     SIMPLE MASTECTOMY WITH AXILLARY SENTINEL NODE BIOPSY Right 07/27/2021   Procedure: SIMPLE MASTECTOMY;  Surgeon: Ronny Bacon, MD;  Location: [facility];  Service: General;  Laterality: Right;   TONSILLECTOMY     HPI:  Per admitting H&P " Kylie Saunders is a 60 y.o. female with medical history significant for schizophrenia, learning disability, hypertension, hyperlipidemia, malignant neoplasm of the right breast, stage IIIb ER/PR positive, presents to the emergency from group home for chief concerns of unresponsiveness.     At bedside patient is lethargic.  Patient  frequently dozes to sleep.  She did not know why she was in the hospital.  She states that she is getting surgery tomorrow.  Patient is arousable and can tell me her name, her age, location of hospital, and current calendar year.  She reports that she is not in any pain and endorses that she feels tired.     She states she does not know why she is in the hospital and she states that she needs to have surgery tomorrow.  I reminded her that she had breast surgery on 07/27/2021 and there is a drain in place.  She then states "oh, okay.  I forgot".     At bedside, she was able to tell me her name, age, current calendar year, and her current location. She denies headache. She endroses cough that started today. She denieis chills, chest pain,k shorntess of breath, abdominl apin, dysuria. "   Assessment / Plan / Recommendation Clinical Impression  Bedside swallow eval today revealed no s/s of aspiration with any tested consistency. Pt admitted to "choking" with liquids when she drinks fast. Noted Pt to be impulsive taking large gulps and often talking prior to swallowing. Pt responded well to cues. Oral mech exam revealed structures to be functioning adequately. Pt was able to chew and swallow solids. Mild oral residue which she cleared with alternating liquids. Vocal quality remained clear throughout assessment, laryngeal elevation appeared adequate. Pt is at risk for aspiration with impulsive eating tendancy. She reports that she eats in a social environment with others in the room. She agreed to slow down and take small  sips and bites. Rec continue with current HH diet. Encourage small bites and sips, continue to eat in a room with other adults as a means of providing some supervision. SLP Visit Diagnosis: Dysphagia, unspecified (R13.10)    Aspiration Risk  Mild aspiration risk    Diet Recommendation Regular;Thin liquid   Liquid Administration via: Straw Medication Administration: Whole meds with  liquid Supervision: Patient able to self feed;Intermittent supervision to cue for compensatory strategies Compensations: Slow rate;Small sips/bites;Other (Comment) (swallow before talking) Postural Changes: Seated upright at 90 degrees;Remain upright for at least 30 minutes after po intake    Other  Recommendations     Follow up Recommendations Other (comment) (back to group home)      Frequency and Duration            Prognosis        Swallow Study   General Date of Onset: 07/28/21 HPI: Per admitting H&P " Kylie Saunders is a 60 y.o. female with medical history significant for schizophrenia, learning disability, hypertension, hyperlipidemia, malignant neoplasm of the right breast, stage IIIb ER/PR positive, presents to the emergency from group home for chief concerns of unresponsiveness.     At bedside patient is lethargic.  Patient frequently dozes to sleep.  She did not know why she was in the hospital.  She states that she is getting surgery tomorrow.  Patient is arousable and can tell me her name, her age, location of hospital, and current calendar year.  She reports that she is not in any pain and endorses that she feels tired.     She states she does not know why she is in the hospital and she states that she needs to have surgery tomorrow.  I reminded her that she had breast surgery on 07/27/2021 and there is a drain in place.  She then states "oh, okay.  I forgot".     At bedside, she was able to tell me her name, age, current calendar year, and her current location. She denies headache. She endroses cough that started today. She denieis chills, chest pain,k shorntess of breath, abdominl apin, dysuria. " Type of Study: Bedside Swallow Evaluation Diet Prior to this Study: Regular;Other (Comment) (Heart Healthy) Temperature Spikes Noted: No Respiratory Status: Room air History of Recent Intubation: No Behavior/Cognition: Alert;Cooperative;Pleasant mood Oral Cavity Assessment: Within  Functional Limits Oral Care Completed by SLP: No Oral Cavity - Dentition: Adequate natural dentition;Missing dentition Vision: Functional for self-feeding Self-Feeding Abilities: Able to feed self Patient Positioning: Upright in bed Baseline Vocal Quality: Normal    Oral/Motor/Sensory Function Overall Oral Motor/Sensory Function: Within functional limits   Ice Chips Ice chips: Within functional limits Presentation: Spoon   Thin Liquid Thin Liquid: Within functional limits Presentation: Cup;Self Fed;Straw;Spoon    Nectar Thick Nectar Thick Liquid: Not tested   Honey Thick Honey Thick Liquid: Not tested   Puree Puree: Within functional limits Presentation: Spoon   Solid     Solid: Within functional limits Presentation: [place] 07/30/2021,11:21 AM

## 2021-07-31 LAB — LEGIONELLA PNEUMOPHILA SEROGP 1 UR AG: L. pneumophila Serogp 1 Ur Ag: NEGATIVE

## 2021-08-02 ENCOUNTER — Other Ambulatory Visit: Payer: Self-pay

## 2021-08-02 ENCOUNTER — Other Ambulatory Visit: Payer: Self-pay | Admitting: Oncology

## 2021-08-02 ENCOUNTER — Encounter: Payer: Self-pay | Admitting: Surgery

## 2021-08-02 ENCOUNTER — Ambulatory Visit (INDEPENDENT_AMBULATORY_CARE_PROVIDER_SITE_OTHER): Payer: Medicare HMO | Admitting: Surgery

## 2021-08-02 VITALS — BP 115/75 | HR 109 | Temp 98.8°F | Ht 59.0 in | Wt 179.0 lb

## 2021-08-02 DIAGNOSIS — Z9011 Acquired absence of right breast and nipple: Secondary | ICD-10-CM

## 2021-08-02 LAB — CULTURE, BLOOD (SINGLE)
Culture: NO GROWTH
Culture: NO GROWTH
Special Requests: ADEQUATE

## 2021-08-02 NOTE — Patient Instructions (Addendum)
If you have any concerns or questions, please feel free to call our office. See follow up appointment below.   Simple Mastectomy  This sheet gives you information about how to care for yourself after your procedure. Your health care provider may also give you more specific instructions. If you have problems or questions, contact your health care provider. What can I expect after the procedure? After the procedure, it is common to have: Pain. Numbness. Stiffness in the arm or shoulder. Feelings of stress, sadness, or depression. If the lymph nodes under your arm were removed, you may have arm swelling, weakness, or numbness on the same side of your body as your surgery. Follow these instructions at home: Incision care  Follow instructions from your health care provider about how to take care of your incision. Make sure you: Wash your hands with soap and water before you change your bandage (dressing). If soap and water are not available, use hand sanitizer. Change your dressing as told by your health care provider. Leave stitches (sutures), skin glue, or adhesive strips in place. These skin closures may need to stay in place for 2 weeks or longer. If adhesive strip edges start to loosen and curl up, you may trim the loose edges. Do not remove adhesive strips completely unless your health care provider tells you to do that. Check your incision area every day for signs of infection. Check for: Redness, swelling, or more pain. Fluid or blood. Warmth. Pus or a bad smell. If you were sent home with a surgical drain in place, follow instructions from your health care provider about emptying it. Bathing Do not take baths, swim, or use a hot tub until your health care provider approves. Ask your health care provider if you may take showers. You may only be allowed to take sponge baths. Activity  Return to your normal activities as told by your health care provider. Ask your health care provider  what activities are safe for you. Avoid activities that take a lot of effort. Be careful to avoid any activities that could cause an injury to your arm on the side of your surgery. Do not lift anything that is heavier than 10 lb (4.5 kg), or the limit that you are told, until your health care provider says that it is safe. Avoid lifting with the arm on the side of your surgery. Do not carry heavy objects on your shoulder. After your drain is removed, do exercises to prevent stiffness and swelling in your arm. Talk with your health care provider about which exercises are safe for you. General instructions Take over-the-counter and prescription medicines only as told by your health care provider. You may eat what you usually do. Keep your arm raised (elevated) above the level of your heart when you are sitting or lying down. Do not wear tight jewelry on your arm, wrist, or fingers on the side of your surgery. You may be given a tight sleeve (compression bandage) to wear over your arm on the side of your surgery. Wear this sleeve as told by your health care provider. Ask your health care provider when you can start wearing a bra or using a breast prosthesis. Before you are involved in certain procedures such as giving blood or having your blood pressure checked, tell all your health care providers if lymph nodes under your arm were removed. This is important information. Follow-up Keep all follow-up visits as told by your health care provider. This is important. Get checked  for extra fluid around your lymph nodes (lymphedema) as often as told by your health care provider. Contact a health care provider if: You have a fever. Your pain medicine is not working. Your arm swelling, weakness, or numbness has not improved after a few weeks. You have new swelling in your breast area or arm. You have redness, swelling, or more pain in your incision area. You have fluid or blood coming from your  incision. Your incision feels warm to the touch. You have pus or a bad smell coming from your incision. Get help right away if: You have very bad pain in your breast area or arm. You have chest pain. You have difficulty breathing. Summary Follow instructions from your health care provider about how to take care of your incision. Check your incision area every day for signs of infection. Ask your health care provider what activities are safe for you. Keep all follow-up visits as told by your health care provider. This is important. Make sure you know which symptoms should cause you to contact your health care provider or to get help right away. This information is not intended to replace advice given to you by your health care provider. Make sure you discuss any questions you have with your health care provider. Document Revised: 05/28/2020 Document Reviewed: 05/28/2020 Elsevier Patient Education  [place].

## 2021-08-03 ENCOUNTER — Inpatient Hospital Stay: Payer: Medicare HMO | Attending: Oncology | Admitting: Oncology

## 2021-08-03 ENCOUNTER — Encounter: Payer: Self-pay | Admitting: Oncology

## 2021-08-03 ENCOUNTER — Other Ambulatory Visit: Payer: Self-pay

## 2021-08-03 VITALS — BP 113/79 | HR 86 | Temp 97.6°F | Resp 16 | Wt 176.0 lb

## 2021-08-03 DIAGNOSIS — Z7189 Other specified counseling: Secondary | ICD-10-CM

## 2021-08-03 DIAGNOSIS — Z170 Estrogen receptor positive status [ER+]: Secondary | ICD-10-CM | POA: Diagnosis not present

## 2021-08-03 DIAGNOSIS — C50811 Malignant neoplasm of overlapping sites of right female breast: Secondary | ICD-10-CM | POA: Diagnosis not present

## 2021-08-03 DIAGNOSIS — Z9011 Acquired absence of right breast and nipple: Secondary | ICD-10-CM | POA: Insufficient documentation

## 2021-08-03 NOTE — Progress Notes (Signed)
[facility] POST-OP OFFICE VISIT  08/03/2021  HPI: Kylie Saunders is a 60 y.o. female 6 days s/p total mastectomy with sentinel lymph node biopsy, right breast.  Postoperative course was interesting for development of sedation and lethargy postop, essentially beginning upon her arrival at her residence.  She was brought to the ER where she was admitted for hypoxemia and pneumonia.  Her alertness quickly turned around and she was discharged the following day.  Unfortunately during her hospitalization her JP drain was inadvertently removed.  She presents today with no complaints of pain, no fevers or chills.  Her caseworker presents with her.  Vital signs: BP 115/75   Pulse (!) 109   Temp 98.8 F (37.1 C) (Oral)   Ht $R'4\' 11"'xY$  (1.499 m)   Wt 179 lb (81.2 kg)   SpO2 93%   BMI 36.15 kg/m    Physical Exam: Constitutional: She looks good she appears to be bright and back at her baseline. Right mastectomy site, significant for large uncomplicated seroma present.  Nearly mimicking breast in size. Skin: Incision is clean dry and intact.  Drain site is clean dry and intact.  Assessment/Plan: This is a 60 y.o. female 6 days s/p right total mastectomy and sentinel lymph node biopsy. At the time of this patient's visit this pathology report was not available, I deferred this note and now have the results of her pathology.  SURGICAL PATHOLOGY  CASE: [id number]  PATIENT: [name]  Surgical Pathology Report      Specimen Submitted:  A. Breast, right with sentinel node #1  B. Sentinel node #2 and #3   Clinical History: Right breast cancer       DIAGNOSIS:  A. RIGHT BREAST WITH SENTINEL NODE #1; MODIFIED RADICAL MASTECTOMY:  - INVASIVE MAMMARY CARCINOMA.  - DUCTAL CARCINOMA IN SITU.  - SEE CANCER SUMMARY BELOW.   CANCER CASE SUMMARY: INVASIVE CARCINOMA OF THE BREAST  Standard(s): AJCC-UICC 8   SPECIMEN  Procedure: Modified radical mastectomy  Specimen  Laterality: Right   TUMOR  Histologic Type: Invasive mammary carcinoma, no special type  Histologic Grade (Nottingham Histologic Score)                       Glandular (Acinar)/Tubular Differentiation: 2                       Nuclear Pleomorphism: 2                       Mitotic Rate: 2                       Overall Grade: 2  Tumor Size: 38 x 27 x 21 mm  Ductal Carcinoma In Situ (DCIS): Present, papillary  Lymphovascular Invasion: Present  Treatment Effect in the Breast: No known presurgical treatment   MARGINS  Margin Status for Invasive Carcinoma: All margins negative for invasive  carcinoma                       Distance from closest margin: 8 mm                       Specify closest margin: Anterior   Margin Status for DCIS: All margins negative for DCIS                       Distance  from DCIS to closest margin: 8 mm                       Specify closest margin: Anterior   REGIONAL LYMPH NODES  Regional Lymph Node Status: Tumor present in regional lymph node(s)                       Number of Lymph Nodes with Macrometastases (greater  than 2 mm): 4                       Number of Lymph Nodes with Micrometastases (greater  than 0.2 mm to 2 mm and/or greater than 200 cells): 0                       Number of Lymph Nodes with Isolated Tumor Cells  (0.2 mm or less OR 200 cells or less): 0                       Size of Largest Metastatic Deposit: 14 mm                       Extranodal Extension: Present                       Total Number of Lymph Nodes Examined (sentinel and  non-sentinel): 5                       Number of Sentinel Nodes Examined: 5   DISTANT METASTASIS  Distant Site(s) Involved, if applicable: Not applicable   PATHOLOGIC STAGE CLASSIFICATION (pTNM, AJCC 8th Edition):  TNM Descriptors: Not applicable  pT2  Regional Lymph Nodes Modifier: SN  HY0V  pM - Not applicable   SPECIAL STUDIES  Breast Biomarker Testing Performed on Previous Biopsy: [phone]    CASE SUMMARY: BREAST BIOMARKER TESTS  Estrogen Receptor (ER) Status: POSITIVE          Percentage of cells with nuclear positivity: >90%          Average intensity of staining: Strong   Progesterone Receptor (PgR) Status: POSITIVE          Percentage of cells with nuclear positivity: >90%          Average intensity of staining: Strong   HER2 (by immunohistochemistry): NEGATIVE (Score 0)    Comment:  Immunohistochemical stains for CK5/6, p63, and calponin were performed  to evaluate the area of DCIS.   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  We anticipate presentation to the breast cancer conference, consideration of evaluation of Oncotype, question of role of  chemotherapy, suspect return to the OR for axillary lymphadenectomy.  We will attempt to balance her social situation along with her best clinical outcome.  She is currently scheduled to return to see me October 4.  Anticipate following up with her care providers after breast cancer conference next available meeting.   Ronny Bacon M.D., FACS 08/03/2021, 11:13 AM

## 2021-08-03 NOTE — Progress Notes (Signed)
Pt and caregiver from group home in for visit today.  Pt denies any concerns and states has done well following surgery. Pt seen by surgeon yesterday.

## 2021-08-03 NOTE — Progress Notes (Signed)
Hematology/Oncology progress note [facility] Telephone:([phone] [phone]   Patient Care Team: Novella Rob, FNP as PCP - General (Family Medicine)  REFERRING PROVIDER: Novella Rob, FNP  CHIEF COMPLAINTS/REASON FOR VISIT:  Evaluation of right breast cancer  HISTORY OF PRESENTING ILLNESS:   Kylie Saunders is a  60 y.o.  female with PMH listed below was seen in consultation at the request of  Novella Rob, FNP  for evaluation of right breast cancer.   Patient lives in a group home. Accompanied by group home staff Rogers,Dorothea. Mr.Patrick Dattilio is her legal guardian.  Patient felt right breast mass last year and initially she did not tell anyone until recently, she told her group home staff.  06/20/2021 bilateral diagnostic breast mammogram showed medial right breast highly suspicious 1.3cm with overlying skin dimpling and skin thickening concerning for liver skin involvement.  Normal right axillary ultrasound. No mass in left breast.   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  ER 90%, PR 90%, HER2 negative.  Patient denies any family history of breast cancer. Patient has a history of uterus cancer, status post hysterectomy.  Details unknown. She has paranoid schizophrenia and intellectual disability.  Patient follows up with psychiatry.  INTERVAL HISTORY Kylie Saunders is a 60 y.o. female who has above history reviewed by me today presents for follow up visit for breast cancer.  07/27/2021 she underwent mastectomy DIAGNOSIS:  A. RIGHT BREAST WITH SENTINEL NODE #1; MODIFIED RADICAL MASTECTOMY:  - INVASIVE MAMMARY CARCINOMA.  - DUCTAL CARCINOMA IN SITU.  - METASTATIC CARCINOMA INVOLVING TWO OF THREE LYMPH NODES (2/3).  - SEE CANCER SUMMARY BELOW.   B. SENTINEL LYMPH NODES #2 AND #3, RIGHT AXILLA; EXCISIONAL BIOPSY:  - METASTATIC  CARCINOMA INVOLVING TWO OF TWO LYMPH NODES (2/2).  - SEE CANCER SUMMARY BELOW.   pT2 pN2a  ER 90%, PR 90% HER2 negative.   Patient presents to discussed pathology and management. POA was called during this encounter.  Patient reports no new complaints. No concerns at the mastectomy site.    Review of Systems  Unable to perform ROS: Other (Schizophrenia and intellectual disability.)  Constitutional:  Negative for fatigue.  Respiratory:  Negative for cough and shortness of breath.   Gastrointestinal:  Negative for abdominal distention and abdominal pain.  Neurological:  Negative for headaches and light-headedness.  Hematological:  Negative for adenopathy.  Psychiatric/Behavioral:  Negative for confusion.    MEDICAL HISTORY:  Past Medical History:  Diagnosis Date   Anxiety    Cancer of body of uterus ([place]) 05/31/2012   Endometrial cancer, had hysterectomy w/BSO 07/25/12   GERD (gastroesophageal reflux disease)    History of 2019 novel coronavirus disease (COVID-19) 12/14/2020   HLD (hyperlipidemia) 07/25/2012   Hypertension    Intellectual disability 07/25/2012   Obesity    Pancreatitis due to biliary obstruction    Paranoid schizophrenia ([place])    Right eye injury 11/03/2016    SURGICAL HISTORY: Past Surgical History:  Procedure Laterality Date   ABDOMINAL HYSTERECTOMY  07/25/2012   with BSO   AXILLARY LYMPH NODE BIOPSY Right 07/27/2021   Procedure: AXILLARY LYMPH NODE BIOPSY;  Surgeon: Ronny Bacon, MD;  Location: [facility] ORS;  Service: General;  Laterality: Right;   BREAST BIOPSY Right 07/07/2021   [place] bx mass, venus marker, path pending   CHOLECYSTECTOMY N/A 01/24/2017   Procedure: LAPAROSCOPIC CHOLECYSTECTOMY WITH INTRAOPERATIVE CHOLANGIOGRAM;  Surgeon: Jerrol Banana  Burt Knack, MD;  Location: [facility] ORS;  Service: General;  Laterality: N/A;   EYE SURGERY     SIMPLE MASTECTOMY WITH AXILLARY SENTINEL NODE BIOPSY Right 07/27/2021   Procedure: SIMPLE MASTECTOMY;  Surgeon: Ronny Bacon, MD;  Location: [facility] ORS;  Service: General;  Laterality: Right;   TONSILLECTOMY      SOCIAL HISTORY: Social History   Socioeconomic History   Marital status: Single    Spouse name: Not on file   Number of children: Not on file   Years of education: Not on file   Highest education level: Not on file  Occupational History   Not on file  Tobacco Use   Smoking status: Never   Smokeless tobacco: Never  Vaping Use   Vaping Use: Never used  Substance and Sexual Activity   Alcohol use: No    Alcohol/week: 0.0 standard drinks   Drug use: No   Sexual activity: Not Currently  Other Topics Concern   Not on file  Social History Narrative   Not on file   Social Determinants of Health   Financial Resource Strain: Not on file  Food Insecurity: Not on file  Transportation Needs: Not on file  Physical Activity: Not on file  Stress: Not on file  Social Connections: Not on file  Intimate Partner Violence: Not on file    FAMILY HISTORY: Family History  Problem Relation Age of Onset   Diabetes Neg Hx    Hypertension Neg Hx     ALLERGIES:  has No Known Allergies.  MEDICATIONS:  Current Outpatient Medications  Medication Sig Dispense Refill   amLODipine (NORVASC) 5 MG tablet Take 5 mg by mouth daily.     amoxicillin-clavulanate (AUGMENTIN) 875-125 MG tablet Take 1 tablet by mouth 2 (two) times daily for 5 days. 10 tablet 0   aspirin 81 MG chewable tablet CHEW ONE TABLET BY MOUTH EVERY DAY     benztropine (COGENTIN) 2 MG tablet Take 1 tablet (2 mg total) by mouth daily. 30 tablet 2   ibuprofen (ADVIL) 800 MG tablet Take 1 tablet (800 mg total) by mouth every 8 (eight) hours as needed. 30 tablet 0   lisinopril-hydrochlorothiazide (PRINZIDE,ZESTORETIC) 20-12.5 MG tablet Take 0.5 tablets by mouth daily.     loratadine (CLARITIN) 10 MG tablet Take 10 mg by mouth daily.     LORazepam (ATIVAN) 0.5 MG tablet Take 1 tablet (0.5 mg total) by mouth 2 (two) times daily. 60 tablet 2    omeprazole (PRILOSEC) 40 MG capsule Take 40 mg by mouth daily.     Polyvinyl Alcohol-Povidone (REFRESH OP) Apply 1 application to eye 2 (two) times daily. Right eye only     risperiDONE (RISPERDAL) 2 MG tablet Take 1 tablet (2 mg total) by mouth 2 (two) times daily. 60 tablet 2   simvastatin (ZOCOR) 40 MG tablet Take 40 mg by mouth daily.      HYDROcodone-acetaminophen (NORCO/VICODIN) 5-325 MG tablet Take 1 tablet by mouth every 6 (six) hours as needed for moderate pain. (Patient not taking: Reported on 08/03/2021) 15 tablet 0   No current facility-administered medications for this visit.     PHYSICAL EXAMINATION: ECOG PERFORMANCE STATUS: 1 - Symptomatic but completely ambulatory Vitals:   08/03/21 1507  BP: 113/79  Pulse: 86  Resp: 16  Temp: 97.6 F (36.4 C)  SpO2: 97%   Filed Weights   08/03/21 1507  Weight: 176 lb (79.8 kg)    Physical Exam Constitutional:      General: She  is not in acute distress. HENT:     Head: Normocephalic and atraumatic.  Eyes:     General: No scleral icterus. Cardiovascular:     Rate and Rhythm: Normal rate and regular rhythm.     Heart sounds: Normal heart sounds.  Pulmonary:     Effort: Pulmonary effort is normal. No respiratory distress.     Breath sounds: No wheezing.  Abdominal:     General: Bowel sounds are normal. There is no distension.     Palpations: Abdomen is soft.  Musculoskeletal:        General: No deformity. Normal range of motion.     Cervical back: Normal range of motion and neck supple.  Skin:    General: Skin is warm and dry.     Findings: No erythema or rash.  Neurological:     Mental Status: She is alert and oriented to person, place, and time. Mental status is at baseline.     Cranial Nerves: No cranial nerve deficit.     Coordination: Coordination normal.   LABORATORY DATA:  I have reviewed the data as listed Lab Results  Component Value Date   WBC 9.6 07/30/2021   HGB 10.2 (L) 07/30/2021   HCT 32.1 (L)  07/30/2021   MCV 92.8 07/30/2021   PLT 195 07/30/2021   Recent Labs    07/18/21 1219 07/27/21 0956 07/28/21 1017 07/29/21 0915 07/30/21 0426  NA 137 136 134* 136 140  K 3.7 3.5 4.8 3.8 3.6  CL 97* 98 94* 105 106  CO2 _0 GLUCOSE 100* 104* 159* 124* 99  BUN _1 CREATININE 0.83 0.82 1.45* 0.82 0.66  CALCIUM 8.8* 8.9 8.4* 7.9* 8.0*  GFRNONAA >60 >60 41* >60 >60  PROT 7.6 7.1 7.3  --   --   ALBUMIN 4.0 3.9 3.8  --   --   AST 14* 14* 28  --   --   ALT 14 15 37  --   --   ALKPHOS 69 58 68  --   --   BILITOT 0.4 0.4 0.5  --   --     Iron/TIBC/Ferritin/ %Sat No results found for: IRON, TIBC, FERRITIN, IRONPCTSAT    RADIOGRAPHIC STUDIES: I have personally reviewed the radiological images as listed and agreed with the findings in the report. CT Angio Chest PE W and/or Wo Contrast  Result Date: 07/28/2021 CLINICAL DATA:  Found unresponsive and altered at group home EXAM: CT ANGIOGRAPHY CHEST WITH CONTRAST TECHNIQUE: Multidetector CT imaging of the chest was performed using the standard protocol during bolus administration of intravenous contrast. Multiplanar CT image reconstructions and MIPs were obtained to evaluate the vascular anatomy. CONTRAST:  56m OMNIPAQUE IOHEXOL 350 MG/ML SOLN COMPARISON:  Same day chest radiograph FINDINGS: Cardiovascular: Satisfactory opacification of the pulmonary arteries to the segmental level. No evidence of pulmonary embolism. The heart is mildly enlarged. There is no pericardial effusion. Mediastinum/Nodes: The thyroid is unremarkable. The esophagus is grossly unremarkable. There is no mediastinal, hilar, or axillary lymphadenopathy. Lungs/Pleura: The trachea and central airways are patent. There is extensive patchy nodular opacity throughout the right lung. The left lung is clear. There is no pleural effusion or pneumothorax. Upper Abdomen: The imaged portions of the upper abdominal viscera are unremarkable. Musculoskeletal: There is  degenerative change in both shoulders and multilevel degenerative change throughout the thoracic spine. There is no acute osseous abnormality or aggressive osseous lesion. Other: The patient is status  post right mastectomy. There is a surgical drain in place. There is scattered subcutaneous air. There is no organized fluid collection. Review of the MIP images confirms the above findings. IMPRESSION: 1. No evidence of pulmonary embolism. 2. Extensive patchy nodular opacities throughout the right lung likely reflect multifocal infection/inflammation or aspiration. Given nodular appearance of many of the opacities, follow-up CT chest in 6-8 weeks is recommended to assess for resolution. 3. Mild cardiomegaly. 4. Status post right mastectomy without evidence of complication. Electronically Signed   By: Valetta Mole M.D.   On: 07/28/2021 13:22   US Abdomen Complete  Result Date: 07/29/2021 CLINICAL DATA:  Abdominal pain. EXAM: ABDOMEN ULTRASOUND COMPLETE COMPARISON:  Prior MRI from 12/19/2016 FINDINGS: Gallbladder: Surgically absent. Common bile duct: Diameter: 8.5 mm Liver: Normal echogenicity without focal lesion or biliary dilatation. Portal vein is patent on color Doppler imaging with normal direction of blood flow towards the liver. IVC: Normal caliber Pancreas: Sonographically unremarkable. Spleen: Normal size.  Normal echogenicity. Right Kidney: Length: 11.2 cm. Normal renal cortical thickness and echogenicity without focal lesions or hydronephrosis. Left Kidney: Length: 10.0 cm. Normal renal cortical thickness and echogenicity without focal lesions or hydronephrosis. Abdominal aorta: Normal caliber.  Atherosclerotic changes are noted. Other findings: No ascites. IMPRESSION: 1. Status post cholecystectomy.  No biliary dilatation. 2. Normal sonographic appearance of the liver, spleen, pancreas and both kidneys. Electronically Signed   By: Marijo Sanes M.D.   On: 07/29/2021 10:38   NM Sentinel Node Inj-No Rpt  (Breast)  Result Date: 07/27/2021 Sulfur Colloid was injected by the Nuclear Medicine Technologist for sentinel lymph node localization.   DG Chest Portable 1 View  Result Date: 07/28/2021 CLINICAL DATA:  SOB EXAM: PORTABLE CHEST 1 VIEW COMPARISON:  None. FINDINGS: The heart is enlarged. No large pleural effusion. Fluffy bilateral pulmonary opacities. No pneumothorax. No acute osseous abnormality. Surgical clips in the right axilla. IMPRESSION: Cardiomegaly with bilateral pulmonary opacities suggestive of at least moderate pulmonary edema. Electronically Signed   By: Albin Felling M.D.   On: 07/28/2021 10:56   ECHOCARDIOGRAM COMPLETE  Result Date: 07/29/2021    ECHOCARDIOGRAM REPORT   Patient Name:   Kylie Saunders Date of Exam: 07/29/2021 Medical Rec #:  [id number]     Height:       62.0 in Accession #:    [id number]    Weight:       179.5 lb Date of Birth:  1961/05/13      BSA:          1.826 m Patient Age:    39 years      BP:           126/75 mmHg Patient Gender: F             HR:           86 bpm. Exam Location:  [facility] Procedure: 2D Echo, Color Doppler and Cardiac Doppler Indications:     Shock  History:         Patient has no prior history of Echocardiogram examinations.                  Risk Factors:Hypertension and Dyslipidemia.  Sonographer:     Charmayne Sheer Referring Phys:  [id number] AMY N COX Diagnosing Phys: Yolonda Kida MD  Sonographer Comments: Suboptimal subcostal window. IMPRESSIONS  1. Left ventricular ejection fraction, by estimation, is 55 to 60%. The left ventricle has normal function. The left ventricle has no regional wall motion abnormalities.  Left ventricular diastolic parameters are consistent with Grade I diastolic dysfunction (impaired relaxation).  2. Right ventricular systolic function is normal. The right ventricular size is normal.  3. The mitral valve is grossly normal. Trivial mitral valve regurgitation.  4. The aortic valve is normal in structure. Aortic valve regurgitation is  not visualized. FINDINGS  Left Ventricle: Left ventricular ejection fraction, by estimation, is 55 to 60%. The left ventricle has normal function. The left ventricle has no regional wall motion abnormalities. The left ventricular internal cavity size was normal in size. There is  no left ventricular hypertrophy. Left ventricular diastolic parameters are consistent with Grade I diastolic dysfunction (impaired relaxation). Right Ventricle: The right ventricular size is normal. No increase in right ventricular wall thickness. Right ventricular systolic function is normal. Left Atrium: Left atrial size was normal in size. Right Atrium: Right atrial size was not assessed. Pericardium: There is no evidence of pericardial effusion. Mitral Valve: The mitral valve is grossly normal. Trivial mitral valve regurgitation. MV peak gradient, 3.0 mmHg. The mean mitral valve gradient is 2.0 mmHg. Tricuspid Valve: The tricuspid valve is normal in structure. Tricuspid valve regurgitation is not demonstrated. Aortic Valve: The aortic valve is normal in structure. Aortic valve regurgitation is not visualized. Aortic valve mean gradient measures 5.0 mmHg. Aortic valve peak gradient measures 10.6 mmHg. Aortic valve area, by VTI measures 2.42 cm. Pulmonic Valve: The pulmonic valve was normal in structure. Pulmonic valve regurgitation is not visualized. Aorta: The ascending aorta was not well visualized. IAS/Shunts: No atrial level shunt detected by color flow Doppler.  LEFT VENTRICLE PLAX 2D LVIDd:         4.90 cm  Diastology LVIDs:         3.40 cm  LV e' medial:    5.44 cm/s LV PW:         0.90 cm  LV E/e' medial:  15.7 LV IVS:        0.70 cm  LV e' lateral:   7.94 cm/s LVOT diam:     2.00 cm  LV E/e' lateral: 10.7 LV SV:         62 LV SV Index:   34 LVOT Area:     3.14 cm  LEFT ATRIUM             Index LA diam:        2.60 cm 1.42 cm/m LA Vol (A2C):   47.5 ml 26.02 ml/m LA Vol (A4C):   39.1 ml 21.42 ml/m LA Biplane Vol: 43.4 ml  23.77 ml/m  AORTIC VALVE                    PULMONIC VALVE AV Area (Vmax):    1.93 cm     PV Vmax:       0.98 m/s AV Area (Vmean):   2.05 cm     PV Vmean:      60.200 cm/s AV Area (VTI):     2.42 cm     PV VTI:        0.154 m AV Vmax:           163.00 cm/s  PV Peak grad:  3.8 mmHg AV Vmean:          104.000 cm/s PV Mean grad:  2.0 mmHg AV VTI:            0.257 m AV Peak Grad:      10.6 mmHg AV Mean Grad:  5.0 mmHg LVOT Vmax:         99.90 cm/s LVOT Vmean:        67.900 cm/s LVOT VTI:          0.198 m LVOT/AV VTI ratio: 0.77  AORTA Ao Root diam: 2.90 cm MITRAL VALVE MV Area (PHT): 5.70 cm    SHUNTS MV Area VTI:   2.70 cm    Systemic VTI:  0.20 m MV Peak grad:  3.0 mmHg    Systemic Diam: 2.00 cm MV Mean grad:  2.0 mmHg MV Vmax:       0.87 m/s MV Vmean:      60.9 cm/s MV Decel Time: 133 msec MV E velocity: 85.30 cm/s MV A velocity: 89.10 cm/s MV E/A ratio:  0.96 Dwayne D Callwood MD Electronically signed by Yolonda Kida MD Signature Date/Time: 07/29/2021/4:16:22 PM    Final    MM CLIP PLACEMENT RIGHT  Result Date: 07/07/2021 CLINICAL DATA:  Status post ultrasound-guided core biopsy of right breast mass EXAM: 3D DIAGNOSTIC right MAMMOGRAM POST ultrasound BIOPSY COMPARISON:  Prior films FINDINGS: 3D Mammographic images were obtained following ultrasound guided biopsy of mass at right breast 3 o'clock. The biopsy marking clip is in expected position at the site of biopsy. IMPRESSION: Appropriate positioning of the venous shaped biopsy marking clip at the site of biopsy in the mass of concern. Final Assessment: Post Procedure Mammograms for Marker Placement Electronically Signed   By: Abelardo Diesel M.D.   On: 07/07/2021 13:46  [place] RT BREAST BX W LOC DEV 1ST LESION IMG BX SPEC US GUIDE  Addendum Date: 07/08/2021   ADDENDUM REPORT: 07/08/2021 11:44 ADDENDUM: PATHOLOGY revealed: A. BREAST, RIGHT AT 3:00, 1 CM FROM THE NIPPLE; ULTRASOUND-GUIDED CORE NEEDLE BIOPSY: - INVASIVE MAMMARY CARCINOMA, NO SPECIAL  TYPE. Size of invasive carcinoma: 13 mm in this sample. Grade 3. Ductal carcinoma in situ: Not identified. Lymphovascular invasion: Not identified. Pathology results are CONCORDANT with imaging findings, per Dr. Abelardo Diesel. Pathology results and recommendations were discussed with patient via telephone on 07/08/2021. Patient reported doing well after the biopsy with no adverse symptoms, and only slight tenderness at the site. Post biopsy care instructions were reviewed, questions were answered and my direct phone number was provided. Patient was instructed to call [facility] for any additional questions or concerns related to biopsy site. Recommend surgical consultation: Request for surgical consultation relayed to Al Pimple RN and Tanya Nones RN at [facility] by Electa Sniff RN on 07/08/2021. Pathology results reported by Electa Sniff RN on 07/08/2021. Electronically Signed   By: Abelardo Diesel M.D.   On: 07/08/2021 11:44   Result Date: 07/08/2021 CLINICAL DATA:  RIGHT BREAST MASS FOR BIOPSY EXAM: ULTRASOUND GUIDED RIGHT BREAST CORE NEEDLE BIOPSY COMPARISON:  Previous exam(s). PROCEDURE: I met with the patient and we discussed the procedure of ultrasound-guided biopsy, including benefits and alternatives. We discussed the high likelihood of a successful procedure. We discussed the risks of the procedure, including infection, bleeding, tissue injury, clip migration, and inadequate sampling. Informed written consent was given. The usual time-out protocol was performed immediately prior to the procedure. Lesion quadrant: RIGHT BREAST 3 O'CLOCK Using sterile technique and 1% Lidocaine as local anesthetic, under direct ultrasound visualization, a 12 gauge spring-loaded device was used to perform biopsy of mass at right breast 3 o'clock using a medial approach. At the conclusion of the procedure venus tissue marker clip was deployed into the biopsy cavity. Follow up 2 view  mammogram was  performed and dictated separately. IMPRESSION: Ultrasound guided biopsy of right breast. No apparent complications. Electronically Signed: By: Abelardo Diesel M.D. On: 07/07/2021 13:37     ASSESSMENT & PLAN:  1. Malignant neoplasm of overlapping sites of right breast in female, estrogen receptor positive ([place])   2. Goals of care, counseling/discussion   Cancer Staging Malignant neoplasm of overlapping sites of right breast in female, estrogen receptor positive ([place]) Staging form: Breast, AJCC 8th Edition - Pathologic stage from 08/03/2021: Stage IB (pT2, pN2a, cM0, G2, ER+, PR+, HER2-) - Signed by Earlie Server, MD on 08/03/2021    Right invasive breast carcinoma, ER+. PR+, HER2-   S/p mastectomy and SLNB pT2 pN2 I had a lenghthy discussion with patient's POA Patric. Pathology was reviewed with him.  Patient has N2 disease and I recommend additional image to complete staging and also adjuvant chemotherapy with ddAC followed by weekly Taxol. Case will be discussed on case conference.      All questions were answered. The patient knows to call the clinic with any problems questions or concerns.  cc Novella Rob, FNP    Return of visit: TBD   Earlie Server, MD, PhD 08/03/2021

## 2021-08-05 ENCOUNTER — Telehealth: Payer: Self-pay

## 2021-08-05 NOTE — Telephone Encounter (Signed)
Request faxed to [facility] pathology to add Ki67 testing to specimen: [phone].

## 2021-08-09 LAB — SURGICAL PATHOLOGY

## 2021-08-11 ENCOUNTER — Encounter: Payer: Self-pay | Admitting: Pathology

## 2021-08-11 ENCOUNTER — Encounter: Payer: Self-pay | Admitting: Oncology

## 2021-08-14 ENCOUNTER — Encounter: Payer: Self-pay | Admitting: Oncology

## 2021-08-14 DIAGNOSIS — C50911 Malignant neoplasm of unspecified site of right female breast: Secondary | ICD-10-CM | POA: Insufficient documentation

## 2021-08-14 NOTE — Progress Notes (Signed)
START ON PATHWAY REGIMEN - Breast     Cycles 1 through 4: A cycle is every 14 days:     Doxorubicin      Cyclophosphamide      [id number]    Cycles 5 through 16: A cycle is every 7 days:     Paclitaxel   **Always confirm dose/schedule in your pharmacy ordering system**  Patient Characteristics: Postoperative without Neoadjuvant Therapy (Pathologic Staging), Invasive Disease, Adjuvant Therapy, HER2 Negative/Unknown/Equivocal, ER Positive, Node Positive, Node Positive (4+) Therapeutic Status: Postoperative without Neoadjuvant Therapy (Pathologic Staging) AJCC Grade: G2 AJCC N Category: pN2a AJCC M Category: cM0 ER Status: Positive (+) AJCC 8 Stage Grouping: IB HER2 Status: Negative (-) Oncotype Dx Recurrence Score: Not Appropriate AJCC T Category: pT2 PR Status: Positive (+) Adjuvant Therapy Status: No Adjuvant Therapy Received Yet or Changing Initial Adjuvant Regimen due to Tolerance Intent of Therapy: Curative Intent, Discussed with Patient

## 2021-08-15 ENCOUNTER — Encounter: Payer: Self-pay | Admitting: Oncology

## 2021-08-15 NOTE — Telephone Encounter (Signed)
Testing results scanned in media.

## 2021-08-16 ENCOUNTER — Emergency Department: Payer: Medicare HMO

## 2021-08-16 ENCOUNTER — Other Ambulatory Visit: Payer: Self-pay

## 2021-08-16 ENCOUNTER — Inpatient Hospital Stay
Admission: EM | Admit: 2021-08-16 | Discharge: 2021-08-20 | DRG: 862 | Disposition: A | Discharge status: Home / Self Care | Payer: Medicare HMO | Attending: Family Medicine | Admitting: Family Medicine

## 2021-08-16 DIAGNOSIS — B9562 Methicillin resistant Staphylococcus aureus infection as the cause of diseases classified elsewhere: Secondary | ICD-10-CM | POA: Diagnosis present

## 2021-08-16 DIAGNOSIS — L7634 Postprocedural seroma of skin and subcutaneous tissue following other procedure: Secondary | ICD-10-CM | POA: Diagnosis present

## 2021-08-16 DIAGNOSIS — J9602 Acute respiratory failure with hypercapnia: Secondary | ICD-10-CM | POA: Diagnosis present

## 2021-08-16 DIAGNOSIS — L03313 Cellulitis of chest wall: Secondary | ICD-10-CM | POA: Diagnosis present

## 2021-08-16 DIAGNOSIS — N179 Acute kidney failure, unspecified: Secondary | ICD-10-CM

## 2021-08-16 DIAGNOSIS — E876 Hypokalemia: Secondary | ICD-10-CM | POA: Diagnosis present

## 2021-08-16 DIAGNOSIS — G9341 Metabolic encephalopathy: Secondary | ICD-10-CM | POA: Diagnosis not present

## 2021-08-16 DIAGNOSIS — T8141XA Infection following a procedure, superficial incisional surgical site, initial encounter: Secondary | ICD-10-CM | POA: Diagnosis not present

## 2021-08-16 DIAGNOSIS — Z8616 Personal history of COVID-19: Secondary | ICD-10-CM

## 2021-08-16 DIAGNOSIS — Z6837 Body mass index (BMI) 37.0-37.9, adult: Secondary | ICD-10-CM

## 2021-08-16 DIAGNOSIS — F200 Paranoid schizophrenia: Secondary | ICD-10-CM | POA: Diagnosis present

## 2021-08-16 DIAGNOSIS — Z7982 Long term (current) use of aspirin: Secondary | ICD-10-CM

## 2021-08-16 DIAGNOSIS — Z9011 Acquired absence of right breast and nipple: Secondary | ICD-10-CM

## 2021-08-16 DIAGNOSIS — E871 Hypo-osmolality and hyponatremia: Secondary | ICD-10-CM | POA: Diagnosis present

## 2021-08-16 DIAGNOSIS — I700 Atherosclerosis of aorta: Secondary | ICD-10-CM | POA: Diagnosis present

## 2021-08-16 DIAGNOSIS — E669 Obesity, unspecified: Secondary | ICD-10-CM | POA: Diagnosis present

## 2021-08-16 DIAGNOSIS — H5461 Unqualified visual loss, right eye, normal vision left eye: Secondary | ICD-10-CM | POA: Diagnosis present

## 2021-08-16 DIAGNOSIS — K219 Gastro-esophageal reflux disease without esophagitis: Secondary | ICD-10-CM | POA: Diagnosis present

## 2021-08-16 DIAGNOSIS — F79 Unspecified intellectual disabilities: Secondary | ICD-10-CM | POA: Diagnosis present

## 2021-08-16 DIAGNOSIS — J9601 Acute respiratory failure with hypoxia: Secondary | ICD-10-CM | POA: Diagnosis present

## 2021-08-16 DIAGNOSIS — I10 Essential (primary) hypertension: Secondary | ICD-10-CM | POA: Diagnosis present

## 2021-08-16 DIAGNOSIS — E785 Hyperlipidemia, unspecified: Secondary | ICD-10-CM | POA: Diagnosis present

## 2021-08-16 DIAGNOSIS — Z90710 Acquired absence of both cervix and uterus: Secondary | ICD-10-CM

## 2021-08-16 DIAGNOSIS — Z853 Personal history of malignant neoplasm of breast: Secondary | ICD-10-CM

## 2021-08-16 DIAGNOSIS — T8149XA Infection following a procedure, other surgical site, initial encounter: Secondary | ICD-10-CM | POA: Diagnosis present

## 2021-08-16 DIAGNOSIS — I110 Hypertensive heart disease with heart failure: Secondary | ICD-10-CM | POA: Diagnosis present

## 2021-08-16 DIAGNOSIS — I2510 Atherosclerotic heart disease of native coronary artery without angina pectoris: Secondary | ICD-10-CM | POA: Diagnosis present

## 2021-08-16 DIAGNOSIS — Z79899 Other long term (current) drug therapy: Secondary | ICD-10-CM

## 2021-08-16 DIAGNOSIS — R6250 Unspecified lack of expected normal physiological development in childhood: Secondary | ICD-10-CM | POA: Diagnosis present

## 2021-08-16 DIAGNOSIS — Z66 Do not resuscitate: Secondary | ICD-10-CM | POA: Diagnosis present

## 2021-08-16 DIAGNOSIS — E860 Dehydration: Secondary | ICD-10-CM | POA: Diagnosis present

## 2021-08-16 DIAGNOSIS — D649 Anemia, unspecified: Secondary | ICD-10-CM | POA: Diagnosis present

## 2021-08-16 DIAGNOSIS — Z170 Estrogen receptor positive status [ER+]: Secondary | ICD-10-CM

## 2021-08-16 DIAGNOSIS — I959 Hypotension, unspecified: Secondary | ICD-10-CM | POA: Diagnosis present

## 2021-08-16 DIAGNOSIS — C50811 Malignant neoplasm of overlapping sites of right female breast: Secondary | ICD-10-CM | POA: Diagnosis present

## 2021-08-16 DIAGNOSIS — Z8542 Personal history of malignant neoplasm of other parts of uterus: Secondary | ICD-10-CM

## 2021-08-16 DIAGNOSIS — Y838 Other surgical procedures as the cause of abnormal reaction of the patient, or of later complication, without mention of misadventure at the time of the procedure: Secondary | ICD-10-CM | POA: Diagnosis present

## 2021-08-16 DIAGNOSIS — Z9049 Acquired absence of other specified parts of digestive tract: Secondary | ICD-10-CM

## 2021-08-16 DIAGNOSIS — E8729 Other acidosis: Secondary | ICD-10-CM | POA: Diagnosis present

## 2021-08-16 LAB — COMPREHENSIVE METABOLIC PANEL
ALT: 12 U/L (ref 0–44)
AST: 10 U/L — ABNORMAL LOW (ref 15–41)
Albumin: 2.8 g/dL — ABNORMAL LOW (ref 3.5–5.0)
Alkaline Phosphatase: 59 U/L (ref 38–126)
Anion gap: 11 (ref 5–15)
BUN: 44 mg/dL — ABNORMAL HIGH (ref 8–23)
CO2: 26 mmol/L (ref 22–32)
Calcium: 8.1 mg/dL — ABNORMAL LOW (ref 8.9–10.3)
Chloride: 96 mmol/L — ABNORMAL LOW (ref 98–111)
Creatinine, Ser: 2.53 mg/dL — ABNORMAL HIGH (ref 0.44–1.00)
GFR, Estimated: 21 mL/min — ABNORMAL LOW (ref >60)
Glucose, Bld: 138 mg/dL — ABNORMAL HIGH (ref 70–99)
Potassium: 3.4 mmol/L — ABNORMAL LOW (ref 3.5–5.1)
Sodium: 133 mmol/L — ABNORMAL LOW (ref 135–145)
Total Bilirubin: 0.8 mg/dL (ref 0.3–1.2)
Total Protein: 6.6 g/dL (ref 6.5–8.1)

## 2021-08-16 LAB — CBC WITH DIFFERENTIAL/PLATELET
Abs Immature Granulocytes: 0.03 10*3/uL (ref 0.00–0.07)
Basophils Absolute: 0 10*3/uL (ref 0.0–0.1)
Basophils Relative: 1 %
Eosinophils Absolute: 0.4 10*3/uL (ref 0.0–0.5)
Eosinophils Relative: 6 %
HCT: 30.7 % — ABNORMAL LOW (ref 36.0–46.0)
Hemoglobin: 10.5 g/dL — ABNORMAL LOW (ref 12.0–15.0)
Immature Granulocytes: 0 %
Lymphocytes Relative: 16 %
Lymphs Abs: 1.2 10*3/uL (ref 0.7–4.0)
MCH: 30.5 pg (ref 26.0–34.0)
MCHC: 34.2 g/dL (ref 30.0–36.0)
MCV: 89.2 fL (ref 80.0–100.0)
Monocytes Absolute: 1.8 10*3/uL — ABNORMAL HIGH (ref 0.1–1.0)
Monocytes Relative: 26 %
Neutro Abs: 3.7 10*3/uL (ref 1.7–7.7)
Neutrophils Relative %: 51 %
Platelets: 257 10*3/uL (ref 150–400)
RBC: 3.44 MIL/uL — ABNORMAL LOW (ref 3.87–5.11)
RDW: 14.6 % (ref 11.5–15.5)
Smear Review: NORMAL
WBC: 7.1 10*3/uL (ref 4.0–10.5)
nRBC: 0 % (ref 0.0–0.2)

## 2021-08-16 LAB — LACTIC ACID, PLASMA
Lactic Acid, Venous: 0.9 mmol/L (ref 0.5–1.9)
Lactic Acid, Venous: 1 mmol/L (ref 0.5–1.9)

## 2021-08-16 MED ORDER — ONDANSETRON HCL 4 MG/2ML IJ SOLN
4.0000 mg | Freq: Four times a day (QID) | INTRAMUSCULAR | Status: DC | PRN
  Administered 2021-08-17: 4 mg via INTRAVENOUS

## 2021-08-16 MED ORDER — POTASSIUM CHLORIDE IN NACL 20-0.9 MEQ/L-% IV SOLN
INTRAVENOUS | Status: DC
  Filled 2021-08-16 (×4): qty 1000

## 2021-08-16 MED ORDER — VANCOMYCIN HCL IN DEXTROSE 1-5 GM/200ML-% IV SOLN
1000.0000 mg | Freq: Once | INTRAVENOUS | Status: DC
  Filled 2021-08-16: qty 200

## 2021-08-16 MED ORDER — BENZTROPINE MESYLATE 1 MG PO TABS
2.0000 mg | ORAL_TABLET | Freq: Every day | ORAL | Status: DC
  Administered 2021-08-17 – 2021-08-20 (×4): 2 mg via ORAL
  Filled 2021-08-16 (×4): qty 2

## 2021-08-16 MED ORDER — SODIUM CHLORIDE 0.9 % IV SOLN
2.0000 g | Freq: Once | INTRAVENOUS | Status: AC
  Administered 2021-08-16: 2 g via INTRAVENOUS
  Filled 2021-08-16: qty 20

## 2021-08-16 MED ORDER — LACTATED RINGERS IV BOLUS (SEPSIS)
1000.0000 mL | Freq: Once | INTRAVENOUS | Status: AC
  Administered 2021-08-16: 1000 mL via INTRAVENOUS

## 2021-08-16 MED ORDER — ACETAMINOPHEN 325 MG PO TABS
650.0000 mg | ORAL_TABLET | Freq: Four times a day (QID) | ORAL | Status: DC | PRN
  Administered 2021-08-17: 650 mg via ORAL
  Filled 2021-08-16: qty 2

## 2021-08-16 MED ORDER — POLYETHYLENE GLYCOL 3350 17 G PO PACK: 17.0000 g | PACK | Freq: Every day | ORAL | Status: DC | PRN

## 2021-08-16 MED ORDER — TRAZODONE HCL 50 MG PO TABS: 25.0000 mg | ORAL_TABLET | Freq: Every evening | ORAL | Status: DC | PRN

## 2021-08-16 MED ORDER — VANCOMYCIN HCL IN DEXTROSE 1-5 GM/200ML-% IV SOLN
1000.0000 mg | Freq: Once | INTRAVENOUS | Status: AC
  Administered 2021-08-16: 1000 mg via INTRAVENOUS

## 2021-08-16 MED ORDER — ACETAMINOPHEN 650 MG RE SUPP: 650.0000 mg | Freq: Four times a day (QID) | RECTAL | Status: DC | PRN

## 2021-08-16 MED ORDER — VANCOMYCIN HCL 750 MG/150ML IV SOLN
750.0000 mg | Freq: Once | INTRAVENOUS | Status: AC
  Administered 2021-08-16: 750 mg via INTRAVENOUS
  Filled 2021-08-16: qty 150

## 2021-08-16 MED ORDER — LORAZEPAM 0.5 MG PO TABS
0.5000 mg | ORAL_TABLET | Freq: Two times a day (BID) | ORAL | Status: DC
  Administered 2021-08-16 – 2021-08-20 (×6): 0.5 mg via ORAL
  Filled 2021-08-16 (×7): qty 1

## 2021-08-16 MED ORDER — ASPIRIN 81 MG PO CHEW
81.0000 mg | CHEWABLE_TABLET | Freq: Every day | ORAL | Status: DC
  Administered 2021-08-18 – 2021-08-20 (×3): 81 mg via ORAL
  Filled 2021-08-16 (×4): qty 1

## 2021-08-16 MED ORDER — SIMVASTATIN 20 MG PO TABS
40.0000 mg | ORAL_TABLET | Freq: Every day | ORAL | Status: DC
  Administered 2021-08-17 – 2021-08-20 (×4): 40 mg via ORAL
  Filled 2021-08-16 (×3): qty 2
  Filled 2021-08-16: qty 4

## 2021-08-16 MED ORDER — LORATADINE 10 MG PO TABS
10.0000 mg | ORAL_TABLET | Freq: Every day | ORAL | Status: DC
  Administered 2021-08-17 – 2021-08-20 (×4): 10 mg via ORAL
  Filled 2021-08-16 (×4): qty 1

## 2021-08-16 MED ORDER — RISPERIDONE 1 MG PO TABS
2.0000 mg | ORAL_TABLET | Freq: Two times a day (BID) | ORAL | Status: DC
  Administered 2021-08-16 – 2021-08-20 (×7): 2 mg via ORAL
  Filled 2021-08-16 (×9): qty 2

## 2021-08-16 MED ORDER — AMLODIPINE BESYLATE 5 MG PO TABS
5.0000 mg | ORAL_TABLET | Freq: Every day | ORAL | Status: DC
  Administered 2021-08-17: 5 mg via ORAL
  Filled 2021-08-16: qty 1

## 2021-08-16 MED ORDER — ENOXAPARIN SODIUM 30 MG/0.3ML IJ SOSY
30.0000 mg | PREFILLED_SYRINGE | INTRAMUSCULAR | Status: DC
  Administered 2021-08-16 – 2021-08-17 (×2): 30 mg via SUBCUTANEOUS
  Filled 2021-08-16 (×2): qty 0.3

## 2021-08-16 MED ORDER — PANTOPRAZOLE SODIUM 40 MG PO TBEC
40.0000 mg | DELAYED_RELEASE_TABLET | Freq: Every day | ORAL | Status: DC
  Administered 2021-08-17 – 2021-08-20 (×4): 40 mg via ORAL
  Filled 2021-08-16 (×4): qty 1

## 2021-08-16 MED ORDER — MAGNESIUM HYDROXIDE 400 MG/5ML PO SUSP: 30.0000 mL | Freq: Every day | ORAL | Status: DC | PRN

## 2021-08-16 MED ORDER — ONDANSETRON HCL 4 MG PO TABS: 4.0000 mg | ORAL_TABLET | Freq: Four times a day (QID) | ORAL | Status: DC | PRN

## 2021-08-16 NOTE — ED Triage Notes (Signed)
Pt comes into the ED via EMS from Group home on [facility], had a right mastectomy last week, having clear drainage with redness around the incision.  108/62 HR114 93%RA 100.5 temp

## 2021-08-16 NOTE — Sepsis Progress Note (Signed)
Following per sepsis protocol   

## 2021-08-16 NOTE — ED Provider Notes (Signed)
[facility] Emergency Department Provider Note   ____________________________________________   Event Date/Time   First MD Initiated Contact with Patient 08/16/21 1916     (approximate)  I have reviewed the triage vital signs and the nursing notes.   HISTORY  Chief Complaint Wound Infection    HPI Kylie Saunders is a 60 y.o. female who presents for redness and swelling around the right breast after a mastectomy 1 week prior to arrival  LOCATION: Right breast DURATION: 1 week prior to arrival TIMING: Worsening since onset SEVERITY: Moderate QUALITY: Erythema CONTEXT: Patient had a right mastectomy due to neoplasm on 07/27/2021 and since that time has had serosanguineous drainage as well as erythema to the surgical site MODIFYING FACTORS: Denies any exacerbating or relieving factors. ASSOCIATED SYMPTOMS: Patient denies any pain surrounding the area but does endorse serosanguineous drainage   Per medical record review, patient mastectomy by Dr. Lissa Merlin on 07/27/2021          Past Medical History:  Diagnosis Date   Anxiety    Cancer of body of uterus ([place]) 05/31/2012   Endometrial cancer, had hysterectomy w/BSO 07/25/12   GERD (gastroesophageal reflux disease)    History of 2019 novel coronavirus disease (COVID-19) 12/14/2020   HLD (hyperlipidemia) 07/25/2012   Hypertension    Intellectual disability 07/25/2012   Obesity    Pancreatitis due to biliary obstruction    Paranoid schizophrenia ([place])    Right eye injury 11/03/2016    Patient Active Problem List   Diagnosis Date Noted   AKI (acute kidney injury) ([place]) 08/16/2021   Malignant neoplasm of right female breast ([place]) 08/14/2021   PNA (pneumonia) 07/28/2021   Sepsis ([place]) 07/28/2021   Aspiration pneumonia ([place]) 07/28/2021   Septic shock ([place]) 07/28/2021   Malignant neoplasm of overlapping sites of right breast in female, estrogen receptor positive ([place]) 30/16/0109   Biliary colic  32/35/5732   Ruptured globe of right eye 12/22/2016   Left upper quadrant pain    Acute pancreatitis    Abdominal pain 12/16/2016   Schizophrenia, paranoid ([place]) 11/03/2016   Right eye injury 11/03/2016   HLD (hyperlipidemia) 07/25/2012   Intellectual disability 07/25/2012   Cancer of body of uterus ([place]) 05/31/2012   Malignant neoplasm of body of uterus ([place]) 05/31/2012    Past Surgical History:  Procedure Laterality Date   ABDOMINAL HYSTERECTOMY  07/25/2012   with BSO   AXILLARY LYMPH NODE BIOPSY Right 07/27/2021   Procedure: AXILLARY LYMPH NODE BIOPSY;  Surgeon: Ronny Bacon, MD;  Location: [facility] ORS;  Service: General;  Laterality: Right;   BREAST BIOPSY Right 07/07/2021   [place] bx mass, venus marker, path pending   CHOLECYSTECTOMY N/A 01/24/2017   Procedure: LAPAROSCOPIC CHOLECYSTECTOMY WITH INTRAOPERATIVE CHOLANGIOGRAM;  Surgeon: Florene Glen, MD;  Location: [facility] ORS;  Service: General;  Laterality: N/A;   EYE SURGERY     SIMPLE MASTECTOMY WITH AXILLARY SENTINEL NODE BIOPSY Right 07/27/2021   Procedure: SIMPLE MASTECTOMY;  Surgeon: Ronny Bacon, MD;  Location: [facility] ORS;  Service: General;  Laterality: Right;   TONSILLECTOMY      Prior to Admission medications   Medication Sig Start Date End Date Taking? Authorizing Provider  amLODipine (NORVASC) 5 MG tablet Take 5 mg by mouth daily.    [provider]  aspirin 81 MG chewable tablet CHEW ONE TABLET BY MOUTH EVERY DAY 08/12/18   [provider]  benztropine (COGENTIN) 2 MG tablet Take 1 tablet (2 mg total) by mouth daily. 06/26/21 09/24/21  Norman Clay, MD  HYDROcodone-acetaminophen (NORCO/VICODIN) 5-325 MG tablet Take 1 tablet by mouth every 6 (six) hours as needed for moderate pain. Patient not taking: Reported on 08/03/2021 07/27/21   Ronny Bacon, MD  ibuprofen (ADVIL) 800 MG tablet Take 1 tablet (800 mg total) by mouth every 8 (eight) hours as needed. 07/27/21   Ronny Bacon, MD   lisinopril-hydrochlorothiazide (PRINZIDE,ZESTORETIC) 20-12.5 MG tablet Take 0.5 tablets by mouth daily.    [provider]  loratadine (CLARITIN) 10 MG tablet Take 10 mg by mouth daily.    [provider]  LORazepam (ATIVAN) 0.5 MG tablet Take 1 tablet (0.5 mg total) by mouth 2 (two) times daily. 06/26/21 09/24/21  Norman Clay, MD  omeprazole (PRILOSEC) 40 MG capsule Take 40 mg by mouth daily.    [provider]  Polyvinyl Alcohol-Povidone (REFRESH OP) Apply 1 application to eye 2 (two) times daily. Right eye only    [provider]  risperiDONE (RISPERDAL) 2 MG tablet Take 1 tablet (2 mg total) by mouth 2 (two) times daily. 06/26/21 09/24/21  Norman Clay, MD  simvastatin (ZOCOR) 40 MG tablet Take 40 mg by mouth daily.  05/31/12   [provider]    Allergies Patient has no known allergies.  Family History  Problem Relation Age of Onset   Diabetes Neg Hx    Hypertension Neg Hx     Social History Social History   Tobacco Use   Smoking status: Never   Smokeless tobacco: Never  Vaping Use   Vaping Use: Never used  Substance Use Topics   Alcohol use: No    Alcohol/week: 0.0 standard drinks   Drug use: No    Review of Systems Constitutional: No fever/chills Eyes: No visual changes. ENT: No sore throat. Cardiovascular: Denies chest pain. Respiratory: Denies shortness of breath. Gastrointestinal: No abdominal pain.  No nausea, no vomiting.  No diarrhea. Genitourinary: Negative for dysuria. Musculoskeletal: Negative for acute arthralgias Skin: Positive for redness surrounding right breast surgical incision Neurological: Negative for headaches, weakness/numbness/paresthesias in any extremity Psychiatric: Negative for suicidal ideation/homicidal ideation   ____________________________________________   PHYSICAL EXAM:  VITAL SIGNS: ED Triage Vitals  Enc Vitals Group     BP 08/16/21 1253 (!) 96/59     Pulse Rate 08/16/21 1253 (!)  108     Resp 08/16/21 1253 18     Temp 08/16/21 1253 99.3 F (37.4 C)     Temp Source 08/16/21 1253 Oral     SpO2 08/16/21 1253 95 %     Weight --      Height --      Head Circumference --      Peak Flow --      Pain Score 08/16/21 1739 1     Pain Loc --      Pain Edu? --      Excl. in [place]? --    Constitutional: Alert and oriented. Well appearing and in no acute distress. Eyes: Conjunctivae are normal. PERRL. Head: Atraumatic. Nose: No congestion/rhinnorhea. Mouth/Throat: Mucous membranes are moist. Neck: No stridor Cardiovascular: Grossly normal heart sounds.  Good peripheral circulation. Respiratory: Normal respiratory effort.  No retractions. Gastrointestinal: Soft and nontender. No distention. Musculoskeletal: No obvious deformities Neurologic:  Normal speech and language. No gross focal neurologic deficits are appreciated. Skin: Mild erythema and induration surrounding patient's right mastectomy surgical wound without tenderness to palpation or purulent drainage.  Skin is warm and dry.  Psychiatric: Mood and affect are normal. Speech and behavior are  normal.  ____________________________________________   LABS (all labs ordered are listed, but only abnormal results are displayed)  Labs Reviewed  COMPREHENSIVE METABOLIC PANEL - Abnormal; Notable for the following components:      Result Value   Sodium 133 (*)    Potassium 3.4 (*)    Chloride 96 (*)    Glucose, Bld 138 (*)    BUN 44 (*)    Creatinine, Ser 2.53 (*)    Calcium 8.1 (*)    Albumin 2.8 (*)    AST 10 (*)    GFR, Estimated 21 (*)    All other components within normal limits  CBC WITH DIFFERENTIAL/PLATELET - Abnormal; Notable for the following components:   RBC 3.44 (*)    Hemoglobin 10.5 (*)    HCT 30.7 (*)    Monocytes Absolute 1.8 (*)    All other components within normal limits  CULTURE, BLOOD (SINGLE)  CULTURE, BLOOD (ROUTINE X 2)  CULTURE, BLOOD (ROUTINE X 2)  RESP PANEL BY RT-PCR (FLU A&B,  COVID) ARPGX2  LACTIC ACID, PLASMA  LACTIC ACID, PLASMA  URINALYSIS, COMPLETE (UACMP) WITH MICROSCOPIC  PROTIME-INR  APTT  BASIC METABOLIC PANEL  CBC   ____________________________________________  EKG  ED ECG REPORT I, Naaman Plummer, the attending physician, personally viewed and interpreted this ECG.  Date: 08/16/2021 EKG Time: 2036 Rate: 102 Rhythm: Tachycardic sinus rhythm QRS Axis: normal Intervals: normal ST/T Wave abnormalities: normal Narrative Interpretation: Tachycardic sinus rhythm.  No evidence of acute ischemia  ____________________________________________  RADIOLOGY  ED MD interpretation: 2 view x-ray of the chest shows cardiomegaly with trace pleural effusions  Official radiology report(s): DG Chest 2 View  Result Date: 08/16/2021 CLINICAL DATA:  Fever, recent right mastectomy EXAM: CHEST - 2 VIEW COMPARISON:  07/28/2021 FINDINGS: Cardiomegaly. Trace pleural effusions. Disc degenerative disease of the thoracic spine. IMPRESSION: Cardiomegaly with trace pleural effusions. Electronically Signed   By: Eddie Candle M.D.   On: 08/16/2021 13:53   CT Chest Wo Contrast  Result Date: 08/16/2021 CLINICAL DATA:  60 year old female with history of mastectomy last week. Clear drainage and redness around the incision site. EXAM: CT CHEST WITHOUT CONTRAST TECHNIQUE: Multidetector CT imaging of the chest was performed following the standard protocol without IV contrast. COMPARISON:  Chest CTA 07/28/2021. FINDINGS: Cardiovascular: Heart size is normal. There is no significant pericardial fluid, thickening or pericardial calcification. There is aortic atherosclerosis, as well as atherosclerosis of the great vessels of the mediastinum and the coronary arteries, including calcified atherosclerotic plaque in the left anterior descending, left circumflex and right coronary arteries. Mediastinum/Nodes: No pathologically enlarged mediastinal or hilar lymph nodes. Please note that  accurate exclusion of hilar adenopathy is limited on noncontrast CT scans. Esophagus is unremarkable in appearance. No axillary lymphadenopathy. Lungs/Pleura: No acute consolidative airspace disease. No pleural effusions. No suspicious appearing pulmonary nodules or masses are noted. Mild chronic scarring in the lung bases bilaterally. Upper Abdomen: Unremarkable. Musculoskeletal: Postoperative changes of recent right-sided modified radical mastectomy are noted. There is a large postoperative fluid collection in the mastectomy bed measuring approximately 11.4 x 5.2 x 13.9 cm (axial image 71 of series 2 and sagittal image 24 of series 6). Along the medial margin of this there is also some soft tissue gas (axial image 78 of series 2) in the subcutaneous fat of the medial aspect of the mastectomy site. Possible communication with the skin is noted on axial image 64 of series 2. There are no aggressive appearing lytic or blastic lesions noted  in the visualized portions of the skeleton. IMPRESSION: 1. Postoperative changes of recent modified radical mastectomy with postoperative fluid and gas in the right chest wall, as detailed above. 2. Aortic atherosclerosis, in addition to 3 vessel coronary artery disease. Please note that although the presence of coronary artery calcium documents the presence of coronary artery disease, the severity of this disease and any potential stenosis cannot be assessed on this non-gated CT examination. Assessment for potential risk factor modification, dietary therapy or pharmacologic therapy may be warranted, if clinically indicated. Electronically Signed   By: Vinnie Langton M.D.   On: 08/16/2021 20:38    ____________________________________________   PROCEDURES  Procedure(s) performed (including Critical Care):  .1-3 Lead EKG Interpretation Performed by: Naaman Plummer, MD Authorized by: Naaman Plummer, MD     Interpretation: abnormal     ECG rate:  114   ECG rate  assessment: tachycardic     Rhythm: sinus tachycardia     Ectopy: none     Conduction: normal     ____________________________________________   INITIAL IMPRESSION / ASSESSMENT AND PLAN / ED COURSE  As part of my medical decision making, I reviewed the following data within the electronic medical record, if available:  Nursing notes reviewed and incorporated, Labs reviewed, EKG interpreted, Old chart reviewed, Radiograph reviewed and Notes from prior ED visits reviewed and incorporated        This patient presents with concern of her right breast surgical site however there is minimal erythema, no leukocytosis, and no lactic acidosis and therefore I have lower suspicion for cellulitis or postsurgical wound infection at this time.. Suspect acute kidney injury of prerenal origin. Doubt intrinsic renal dysfunction or obstructive nephropathy. Considered alternate etiologies of the patients symptoms including infectious processes, severe metabolic derangements or electrolyte abnormalities, ischemia/ACS, heart failure, and intracranial/central processes but think these are unlikely given the history and physical exam.  Plan: labs, 1L fluid resuscitation, pain/nausea control, reassessment  Dispo: Admit to medicine      ____________________________________________   FINAL CLINICAL IMPRESSION(S) / ED DIAGNOSES  Final diagnoses:  AKI (acute kidney injury) ([place])  History of modified radical mastectomy of right breast     ED Discharge Orders     None        Note:  This document was prepared using Dragon voice recognition software and may include unintentional dictation errors.    Naaman Plummer, MD 08/16/21 [phone]

## 2021-08-16 NOTE — ED Notes (Signed)
Patient noted to be a hard stick for blood work.  Unable to get blood cultures at this time.  Will start antibiotics per orders

## 2021-08-16 NOTE — Consult Note (Signed)
PHARMACY -  BRIEF ANTIBIOTIC NOTE   Pharmacy has received consult(s) for Vancomycin from an ED provider.  The patient's profile has been reviewed for ht/wt/allergies/indication/available labs.    One time order(s) placed for:  Vancomycin 1.75g (1000mg +750mg )  Further antibiotics/pharmacy consults should be ordered by admitting physician if indicated.                       Thank you, Lorna Dibble 08/16/2021  9:01 PM

## 2021-08-16 NOTE — ED Notes (Signed)
Caregiver called  Rise Paganini [phone]

## 2021-08-16 NOTE — ED Notes (Signed)
Patient currently in xray at this time

## 2021-08-16 NOTE — H&P (Addendum)
[place]   PATIENT NAME: Kylie Saunders    MR#:  [id number]  DATE OF BIRTH:  July 23, 1961  DATE OF ADMISSION:  08/16/2021  PRIMARY CARE PHYSICIAN: Novella Rob, FNP   Patient is coming from: SNF Acute REQUESTING/REFERRING PHYSICIAN: Naaman Plummer, MD,  CHIEF COMPLAINT:   Chief Complaint  Patient presents with   Wound Infection    HISTORY OF PRESENT ILLNESS:  Kylie Saunders is a 60 y.o. Caucasian female with medical history significant for hypertension, dyslipidemia, paranoid schizophrenia, and anxiety who presented to the emergency room with acute onset of increased redness and swelling around the right breast after mastectomy that was performed a week ago with lymph node dissection by Dr. Milas Gain.  Nursing home staff noted increased drainage from her surgical site.  No reported fever or chills.  No chest pain or palpitations.  No nausea or vomiting or abdominal pain.  No cough or wheezing or dyspnea.  She stated that she has been drinking adequate amount of fluids.  ED Course: Upon presentation to the emergency room, blood pressure was 96/59 with heart rate of 108 and temperature 99.3 with pulse oximetry 95% on room air.  Blood pressure later on was 132/93 with a heart rate of 119 and respiratory rate was 21. EKG as reviewed by me :  EKG showed sinus tachycardia with a rate of 102 and nonspecific interventricular conduction delay and Q waves anteroseptally. Imaging: Chest x-ray showed cardiomegaly with trace pleural effusions. Chest CT without contrast revealed the following: 1. Postoperative changes of recent modified radical mastectomy with postoperative fluid and gas in the right chest wall, as detailed above. 2. Aortic atherosclerosis, in addition to 3 vessel coronary artery disease. Please note that although the presence of coronary artery calcium documents the presence of coronary artery disease, the severity of this disease and any potential stenosis cannot  be assessed on this non-gated CT examination.  The patient was given IV ceftriaxone and vancomycin as well as 1 L bolus of IV lactated Ringer.  She will be admitted to an observation medical bed for further evaluation and management. PAST MEDICAL HISTORY:   Past Medical History:  Diagnosis Date   Anxiety    Cancer of body of uterus ([place]) 05/31/2012   Endometrial cancer, had hysterectomy w/BSO 07/25/12   GERD (gastroesophageal reflux disease)    History of 2019 novel coronavirus disease (COVID-19) 12/14/2020   HLD (hyperlipidemia) 07/25/2012   Hypertension    Intellectual disability 07/25/2012   Obesity    Pancreatitis due to biliary obstruction    Paranoid schizophrenia ([place])    Right eye injury 11/03/2016    PAST SURGICAL HISTORY:   Past Surgical History:  Procedure Laterality Date   ABDOMINAL HYSTERECTOMY  07/25/2012   with BSO   AXILLARY LYMPH NODE BIOPSY Right 07/27/2021   Procedure: AXILLARY LYMPH NODE BIOPSY;  Surgeon: Ronny Bacon, MD;  Location: [facility];  Service: General;  Laterality: Right;   BREAST BIOPSY Right 07/07/2021   [place] bx mass, venus marker, path pending   CHOLECYSTECTOMY N/A 01/24/2017   Procedure: LAPAROSCOPIC CHOLECYSTECTOMY WITH INTRAOPERATIVE CHOLANGIOGRAM;  Surgeon: Florene Glen, MD;  Location: [facility];  Service: General;  Laterality: N/A;   EYE SURGERY     SIMPLE MASTECTOMY WITH AXILLARY SENTINEL NODE BIOPSY Right 07/27/2021   Procedure: SIMPLE MASTECTOMY;  Surgeon: Ronny Bacon, MD;  Location: [facility];  Service: General;  Laterality: Right;   TONSILLECTOMY      SOCIAL HISTORY:  Social History   Tobacco Use   Smoking status: Never   Smokeless tobacco: Never  Substance Use Topics   Alcohol use: No    Alcohol/week: 0.0 standard drinks    FAMILY HISTORY:   Family History  Problem Relation Age of Onset   Diabetes Neg Hx    Hypertension Neg Hx     DRUG ALLERGIES:  No Known Allergies  REVIEW OF SYSTEMS:   ROS As per  history of present illness. All pertinent systems were reviewed above. Constitutional, HEENT, cardiovascular, respiratory, GI, GU, musculoskeletal, neuro, psychiatric, endocrine, integumentary and hematologic systems were reviewed and are otherwise negative/unremarkable except for positive findings mentioned above in the HPI.   MEDICATIONS AT HOME:   Prior to Admission medications   Medication Sig Start Date End Date Taking? Authorizing Provider  amLODipine (NORVASC) 5 MG tablet Take 5 mg by mouth daily.    [provider]  aspirin 81 MG chewable tablet CHEW ONE TABLET BY MOUTH EVERY DAY 08/12/18   [provider]  benztropine (COGENTIN) 2 MG tablet Take 1 tablet (2 mg total) by mouth daily. 06/26/21 09/24/21  Norman Clay, MD  HYDROcodone-acetaminophen (NORCO/VICODIN) 5-325 MG tablet Take 1 tablet by mouth every 6 (six) hours as needed for moderate pain. Patient not taking: Reported on 08/03/2021 07/27/21   Ronny Bacon, MD  ibuprofen (ADVIL) 800 MG tablet Take 1 tablet (800 mg total) by mouth every 8 (eight) hours as needed. 07/27/21   Ronny Bacon, MD  lisinopril-hydrochlorothiazide (PRINZIDE,ZESTORETIC) 20-12.5 MG tablet Take 0.5 tablets by mouth daily.    [provider]  loratadine (CLARITIN) 10 MG tablet Take 10 mg by mouth daily.    [provider]  LORazepam (ATIVAN) 0.5 MG tablet Take 1 tablet (0.5 mg total) by mouth 2 (two) times daily. 06/26/21 09/24/21  Norman Clay, MD  omeprazole (PRILOSEC) 40 MG capsule Take 40 mg by mouth daily.    [provider]  Polyvinyl Alcohol-Povidone (REFRESH OP) Apply 1 application to eye 2 (two) times daily. Right eye only    [provider]  risperiDONE (RISPERDAL) 2 MG tablet Take 1 tablet (2 mg total) by mouth 2 (two) times daily. 06/26/21 09/24/21  Norman Clay, MD  simvastatin (ZOCOR) 40 MG tablet Take 40 mg by mouth daily.  05/31/12   [provider]      VITAL SIGNS:  Blood pressure  (!) 132/93, pulse (!) 119, temperature 99.3 F (37.4 C), temperature source Oral, resp. rate 18, SpO2 93 %.  PHYSICAL EXAMINATION:  Physical Exam  GENERAL:  61 y.o.-year-old obese Caucasian female patient lying in the bed with no acute distress.  EYES: Pupils equal, round, reactive to light and accommodation. No scleral icterus. Extraocular muscles intact.  HEENT: Head atraumatic, normocephalic. Oropharynx and nasopharynx clear.  NECK:  Supple, no jugular venous distention. No thyroid enlargement, no tenderness.  LUNGS: Normal breath sounds bilaterally, no wheezing, rales,rhonchi or crepitation. No use of accessory muscles of respiration.  CARDIOVASCULAR: Regular rate and rhythm, S1, S2 normal. No murmurs, rubs, or gallops.  ABDOMEN: Soft, nondistended, nontender. Bowel sounds present. No organomegaly or mass.  EXTREMITIES: No pedal edema, cyanosis, or clubbing.  NEUROLOGIC: Cranial nerves II through XII are intact. Muscle strength 5/5 in all extremities. Sensation intact. Gait not checked.  PSYCHIATRIC: The patient is alert and oriented x 3.  Normal affect and good eye contact. SKIN: She has mild surrounding erythema with minimal induration around her wound and minimal yellowish scabbing.  Wound is well-healed with  good hemostasis.  No other  obvious rash, lesion, or ulcer.    LABORATORY PANEL:   CBC Recent Labs  Lab 08/16/21 1351  WBC 7.1  HGB 10.5*  HCT 30.7*  PLT 257   ------------------------------------------------------------------------------------------------------------------  Chemistries  Recent Labs  Lab 08/16/21 1351  NA 133*  K 3.4*  CL 96*  CO2 26  GLUCOSE 138*  BUN 44*  CREATININE 2.53*  CALCIUM 8.1*  AST 10*  ALT 12  ALKPHOS 59  BILITOT 0.8   ------------------------------------------------------------------------------------------------------------------  Cardiac Enzymes No results for input(s): TROPONINI in the last 168  hours. ------------------------------------------------------------------------------------------------------------------  RADIOLOGY:  DG Chest 2 View  Result Date: 08/16/2021 CLINICAL DATA:  Fever, recent right mastectomy EXAM: CHEST - 2 VIEW COMPARISON:  07/28/2021 FINDINGS: Cardiomegaly. Trace pleural effusions. Disc degenerative disease of the thoracic spine. IMPRESSION: Cardiomegaly with trace pleural effusions. Electronically Signed   By: Eddie Candle M.D.   On: 08/16/2021 13:53   CT Chest Wo Contrast  Result Date: 08/16/2021 CLINICAL DATA:  60 year old female with history of mastectomy last week. Clear drainage and redness around the incision site. EXAM: CT CHEST WITHOUT CONTRAST TECHNIQUE: Multidetector CT imaging of the chest was performed following the standard protocol without IV contrast. COMPARISON:  Chest CTA 07/28/2021. FINDINGS: Cardiovascular: Heart size is normal. There is no significant pericardial fluid, thickening or pericardial calcification. There is aortic atherosclerosis, as well as atherosclerosis of the great vessels of the mediastinum and the coronary arteries, including calcified atherosclerotic plaque in the left anterior descending, left circumflex and right coronary arteries. Mediastinum/Nodes: No pathologically enlarged mediastinal or hilar lymph nodes. Please note that accurate exclusion of hilar adenopathy is limited on noncontrast CT scans. Esophagus is unremarkable in appearance. No axillary lymphadenopathy. Lungs/Pleura: No acute consolidative airspace disease. No pleural effusions. No suspicious appearing pulmonary nodules or masses are noted. Mild chronic scarring in the lung bases bilaterally. Upper Abdomen: Unremarkable. Musculoskeletal: Postoperative changes of recent right-sided modified radical mastectomy are noted. There is a large postoperative fluid collection in the mastectomy bed measuring approximately 11.4 x 5.2 x 13.9 cm (axial image 71 of series 2 and  sagittal image 24 of series 6). Along the medial margin of this there is also some soft tissue gas (axial image 78 of series 2) in the subcutaneous fat of the medial aspect of the mastectomy site. Possible communication with the skin is noted on axial image 64 of series 2. There are no aggressive appearing lytic or blastic lesions noted in the visualized portions of the skeleton. IMPRESSION: 1. Postoperative changes of recent modified radical mastectomy with postoperative fluid and gas in the right chest wall, as detailed above. 2. Aortic atherosclerosis, in addition to 3 vessel coronary artery disease. Please note that although the presence of coronary artery calcium documents the presence of coronary artery disease, the severity of this disease and any potential stenosis cannot be assessed on this non-gated CT examination. Assessment for potential risk factor modification, dietary therapy or pharmacologic therapy may be warranted, if clinically indicated. Electronically Signed   By: Vinnie Langton M.D.   On: 08/16/2021 20:38      IMPRESSION AND PLAN:  Active Problems:   AKI (acute kidney injury) ([place])  1.  Acute kidney injury, likely prerenal, may be due to volume depletion and ACE/diuretic combination. - The patient will be admitted to an observation medically monitored bed. - We will continue hydration with IV normal saline with added potassium chloride and follow BMP. - We will hold off lisinopril HCT  as well as ibuprofen.  2.  Moderate nonpurulent chest wall cellulitis surrounding right mastectomy wound. - We will place the patient on IV Rocephin overnight that can be later switched to p.o. Augmentin.  3.  Hypokalemia. - Potassium will be replaced and magnesium will be checked.  4.  Paranoid schizophrenia. - We will continue Risperdal and Cogentin.  5.  Essential hypertension. - We will continue Norvasc and hold off lisinopril HCT.  6.  Anxiety. - We will continue her Ativan.  7.   Dyslipidemia. - We will continue statin therapy.   DVT prophylaxis: Lovenox. Code Status: full code. Family Communication:  The plan of care was discussed in details with the patient (and family). I answered all questions. The patient agreed to proceed with the above mentioned plan. Further management will depend upon hospital course. Disposition Plan: Back to previous home environment Consults called: none. All the records are reviewed and case discussed with ED provider.  Status is: Observation  The patient remains OBS appropriate and will d/c before 2 midnights.  Dispo: The patient is from: Nursing Home              Anticipated d/c is to: Nursing Home              Patient currently is not medically stable to d/c.   Difficult to place patient No   TOTAL TIME TAKING CARE OF THIS PATIENT: 55 minutes.    Christel Mormon M.D on 08/16/2021 at 9:43 PM  [facility]   From 7 PM-7 AM, contact night-coverage [email or web address]  CC: Primary care physician; Novella Rob, FNP

## 2021-08-16 NOTE — ED Provider Notes (Signed)
Emergency Medicine Provider Triage Evaluation Note  Kylie Saunders , a 60 y.o. female  was evaluated in triage.  Pt complains of drainage from her right mastectomy wound site.  Patient presents from her group home, with history of right total vasectomy last week.  She has copious drainage coming from the incision which is not dehisced, but does have some surrounding erythema.  Review of Systems  Positive: Wound drainage Negative: CP  Physical Exam  BP (!) 96/59   Pulse (!) 108   Temp 99.3 F (37.4 C) (Oral)   Resp 18   SpO2 95%  Gen:   Awake, no distress  NAD Resp:  Normal effort  MSK:   Moves extremities without difficulty  Other:  SKIN: right mastectomy incision with surrounding erythema. No dehiscence but serous drainage with movement.   Medical Decision Making  Medically screening exam initiated at 1:17 PM.  Appropriate orders placed.  Kylie Saunders was informed that the remainder of the evaluation will be completed by another provider, this initial triage assessment does not replace that evaluation, and the importance of remaining in the ED until their evaluation is complete.  Patient with ED evaluation of drainage from surgical site.    [name], PA-C 08/16/21 1320    Arta Silence, MD 08/16/21 [phone]

## 2021-08-16 NOTE — ED Notes (Signed)
Pt unable to void at this time. 

## 2021-08-17 ENCOUNTER — Observation Stay: Payer: Medicare HMO | Admitting: Anesthesiology

## 2021-08-17 ENCOUNTER — Encounter
Admission: EM | Disposition: A | Discharge status: Home / Self Care | Payer: Medicare HMO | Source: Home / Self Care | Attending: Family Medicine

## 2021-08-17 ENCOUNTER — Encounter: Payer: Self-pay | Admitting: Family Medicine

## 2021-08-17 DIAGNOSIS — R6250 Unspecified lack of expected normal physiological development in childhood: Secondary | ICD-10-CM | POA: Diagnosis present

## 2021-08-17 DIAGNOSIS — Z8616 Personal history of COVID-19: Secondary | ICD-10-CM | POA: Diagnosis not present

## 2021-08-17 DIAGNOSIS — N179 Acute kidney failure, unspecified: Secondary | ICD-10-CM

## 2021-08-17 DIAGNOSIS — E871 Hypo-osmolality and hyponatremia: Secondary | ICD-10-CM | POA: Diagnosis present

## 2021-08-17 DIAGNOSIS — Z66 Do not resuscitate: Secondary | ICD-10-CM | POA: Diagnosis present

## 2021-08-17 DIAGNOSIS — E8729 Other acidosis: Secondary | ICD-10-CM | POA: Diagnosis present

## 2021-08-17 DIAGNOSIS — I959 Hypotension, unspecified: Secondary | ICD-10-CM | POA: Diagnosis present

## 2021-08-17 DIAGNOSIS — I700 Atherosclerosis of aorta: Secondary | ICD-10-CM | POA: Diagnosis present

## 2021-08-17 DIAGNOSIS — E669 Obesity, unspecified: Secondary | ICD-10-CM | POA: Diagnosis present

## 2021-08-17 DIAGNOSIS — C50811 Malignant neoplasm of overlapping sites of right female breast: Secondary | ICD-10-CM | POA: Diagnosis present

## 2021-08-17 DIAGNOSIS — Y838 Other surgical procedures as the cause of abnormal reaction of the patient, or of later complication, without mention of misadventure at the time of the procedure: Secondary | ICD-10-CM | POA: Diagnosis present

## 2021-08-17 DIAGNOSIS — I110 Hypertensive heart disease with heart failure: Secondary | ICD-10-CM | POA: Diagnosis present

## 2021-08-17 DIAGNOSIS — T8149XA Infection following a procedure, other surgical site, initial encounter: Secondary | ICD-10-CM | POA: Diagnosis not present

## 2021-08-17 DIAGNOSIS — L7634 Postprocedural seroma of skin and subcutaneous tissue following other procedure: Secondary | ICD-10-CM | POA: Diagnosis present

## 2021-08-17 DIAGNOSIS — G9341 Metabolic encephalopathy: Secondary | ICD-10-CM | POA: Diagnosis present

## 2021-08-17 DIAGNOSIS — E860 Dehydration: Secondary | ICD-10-CM | POA: Diagnosis present

## 2021-08-17 DIAGNOSIS — T8141XA Infection following a procedure, superficial incisional surgical site, initial encounter: Secondary | ICD-10-CM | POA: Diagnosis present

## 2021-08-17 DIAGNOSIS — L03313 Cellulitis of chest wall: Secondary | ICD-10-CM | POA: Diagnosis present

## 2021-08-17 DIAGNOSIS — I10 Essential (primary) hypertension: Secondary | ICD-10-CM

## 2021-08-17 DIAGNOSIS — E782 Mixed hyperlipidemia: Secondary | ICD-10-CM

## 2021-08-17 DIAGNOSIS — Z6837 Body mass index (BMI) 37.0-37.9, adult: Secondary | ICD-10-CM | POA: Diagnosis not present

## 2021-08-17 DIAGNOSIS — E785 Hyperlipidemia, unspecified: Secondary | ICD-10-CM | POA: Diagnosis present

## 2021-08-17 DIAGNOSIS — E876 Hypokalemia: Secondary | ICD-10-CM | POA: Diagnosis present

## 2021-08-17 DIAGNOSIS — F200 Paranoid schizophrenia: Secondary | ICD-10-CM | POA: Diagnosis present

## 2021-08-17 DIAGNOSIS — J9601 Acute respiratory failure with hypoxia: Secondary | ICD-10-CM | POA: Diagnosis present

## 2021-08-17 DIAGNOSIS — J9602 Acute respiratory failure with hypercapnia: Secondary | ICD-10-CM | POA: Diagnosis present

## 2021-08-17 DIAGNOSIS — B9562 Methicillin resistant Staphylococcus aureus infection as the cause of diseases classified elsewhere: Secondary | ICD-10-CM | POA: Diagnosis present

## 2021-08-17 DIAGNOSIS — D649 Anemia, unspecified: Secondary | ICD-10-CM | POA: Diagnosis present

## 2021-08-17 HISTORY — PX: INCISION AND DRAINAGE ABSCESS: SHX5864

## 2021-08-17 LAB — BASIC METABOLIC PANEL
Anion gap: 10 (ref 5–15)
Anion gap: 11 (ref 5–15)
BUN: 36 mg/dL — ABNORMAL HIGH (ref 8–23)
BUN: 33 mg/dL — ABNORMAL HIGH (ref 8–23)
CO2: 26 mmol/L (ref 22–32)
CO2: 21 mmol/L — ABNORMAL LOW (ref 22–32)
Calcium: 8 mg/dL — ABNORMAL LOW (ref 8.9–10.3)
Calcium: 8 mg/dL — ABNORMAL LOW (ref 8.9–10.3)
Chloride: 98 mmol/L (ref 98–111)
Chloride: 102 mmol/L (ref 98–111)
Creatinine, Ser: 1.83 mg/dL — ABNORMAL HIGH (ref 0.44–1.00)
Creatinine, Ser: 1.84 mg/dL — ABNORMAL HIGH (ref 0.44–1.00)
GFR, Estimated: 31 mL/min — ABNORMAL LOW (ref >60)
GFR, Estimated: 31 mL/min — ABNORMAL LOW (ref >60)
Glucose, Bld: 119 mg/dL — ABNORMAL HIGH (ref 70–99)
Glucose, Bld: 111 mg/dL — ABNORMAL HIGH (ref 70–99)
Potassium: 3.9 mmol/L (ref 3.5–5.1)
Potassium: 4.5 mmol/L (ref 3.5–5.1)
Sodium: 134 mmol/L — ABNORMAL LOW (ref 135–145)
Sodium: 134 mmol/L — ABNORMAL LOW (ref 135–145)

## 2021-08-17 LAB — BLOOD GAS, ARTERIAL
Acid-Base Excess: 1.6 mmol/L (ref 0.0–2.0)
Acid-base deficit: 3.7 mmol/L — ABNORMAL HIGH (ref 0.0–2.0)
Bicarbonate: 23.8 mmol/L (ref 20.0–28.0)
Bicarbonate: 27.2 mmol/L (ref 20.0–28.0)
Delivery systems: POSITIVE
Expiratory PAP: 6
FIO2: 0.5
FIO2: 0.32
Inspiratory PAP: 20
Mechanical Rate: 15
O2 Saturation: 98.7 %
O2 Saturation: 96.2 %
Patient temperature: 37
Patient temperature: 37
RATE: 15 resp/min
pCO2 arterial: 53 mmHg — ABNORMAL HIGH (ref 32.0–48.0)
pCO2 arterial: 47 mmHg (ref 32.0–48.0)
pH, Arterial: 7.26 — ABNORMAL LOW (ref 7.350–7.450)
pH, Arterial: 7.37 (ref 7.350–7.450)
pO2, Arterial: 137 mmHg — ABNORMAL HIGH (ref 83.0–108.0)
pO2, Arterial: 86 mmHg (ref 83.0–108.0)

## 2021-08-17 LAB — CBC WITH DIFFERENTIAL/PLATELET
Abs Immature Granulocytes: 0.07 10*3/uL (ref 0.00–0.07)
Basophils Absolute: 0.1 10*3/uL (ref 0.0–0.1)
Basophils Relative: 1 %
Eosinophils Absolute: 0.3 10*3/uL (ref 0.0–0.5)
Eosinophils Relative: 4 %
HCT: 31.4 % — ABNORMAL LOW (ref 36.0–46.0)
Hemoglobin: 9.7 g/dL — ABNORMAL LOW (ref 12.0–15.0)
Immature Granulocytes: 1 %
Lymphocytes Relative: 22 %
Lymphs Abs: 1.5 10*3/uL (ref 0.7–4.0)
MCH: 29.4 pg (ref 26.0–34.0)
MCHC: 30.9 g/dL (ref 30.0–36.0)
MCV: 95.2 fL (ref 80.0–100.0)
Monocytes Absolute: 1.9 10*3/uL — ABNORMAL HIGH (ref 0.1–1.0)
Monocytes Relative: 28 %
Neutro Abs: 3 10*3/uL (ref 1.7–7.7)
Neutrophils Relative %: 44 %
Platelets: 251 10*3/uL (ref 150–400)
RBC: 3.3 MIL/uL — ABNORMAL LOW (ref 3.87–5.11)
RDW: 15.2 % (ref 11.5–15.5)
Smear Review: NORMAL
WBC: 6.7 10*3/uL (ref 4.0–10.5)
nRBC: 0 % (ref 0.0–0.2)

## 2021-08-17 LAB — URINALYSIS, COMPLETE (UACMP) WITH MICROSCOPIC
Bilirubin Urine: NEGATIVE
Glucose, UA: NEGATIVE mg/dL
Hgb urine dipstick: NEGATIVE
Ketones, ur: NEGATIVE mg/dL
Nitrite: NEGATIVE
Protein, ur: 30 mg/dL — AB
Specific Gravity, Urine: 1.017 (ref 1.005–1.030)
pH: 5 (ref 5.0–8.0)

## 2021-08-17 LAB — CBC
HCT: 30.4 % — ABNORMAL LOW (ref 36.0–46.0)
Hemoglobin: 9.9 g/dL — ABNORMAL LOW (ref 12.0–15.0)
MCH: 29.9 pg (ref 26.0–34.0)
MCHC: 32.6 g/dL (ref 30.0–36.0)
MCV: 91.8 fL (ref 80.0–100.0)
Platelets: 228 10*3/uL (ref 150–400)
RBC: 3.31 MIL/uL — ABNORMAL LOW (ref 3.87–5.11)
RDW: 14.9 % (ref 11.5–15.5)
WBC: 7.1 10*3/uL (ref 4.0–10.5)
nRBC: 0 % (ref 0.0–0.2)

## 2021-08-17 LAB — PROTIME-INR
INR: 1.1 (ref 0.8–1.2)
Prothrombin Time: 14.6 seconds (ref 11.4–15.2)

## 2021-08-17 LAB — RESP PANEL BY RT-PCR (FLU A&B, COVID) ARPGX2
Influenza A by PCR: NEGATIVE
Influenza B by PCR: NEGATIVE
SARS Coronavirus 2 by RT PCR: NEGATIVE

## 2021-08-17 LAB — PROCALCITONIN: Procalcitonin: 1.55 ng/mL

## 2021-08-17 LAB — MAGNESIUM: Magnesium: 1.9 mg/dL (ref 1.7–2.4)

## 2021-08-17 LAB — GLUCOSE, CAPILLARY: Glucose-Capillary: 106 mg/dL — ABNORMAL HIGH (ref 70–99)

## 2021-08-17 LAB — APTT: aPTT: 34 seconds (ref 24–36)

## 2021-08-17 LAB — PHOSPHORUS: Phosphorus: 4.5 mg/dL (ref 2.5–4.6)

## 2021-08-17 SURGERY — INCISION AND DRAINAGE, ABSCESS
Anesthesia: General | Laterality: Right

## 2021-08-17 MED ORDER — LACTATED RINGERS IV BOLUS
250.0000 mL | Freq: Once | INTRAVENOUS | Status: AC
  Administered 2021-08-17: 250 mL via INTRAVENOUS

## 2021-08-17 MED ORDER — VASOPRESSIN 20 UNIT/ML IV SOLN
INTRAVENOUS | Status: AC
  Filled 2021-08-17: qty 1

## 2021-08-17 MED ORDER — LIDOCAINE HCL (CARDIAC) PF 100 MG/5ML IV SOSY
PREFILLED_SYRINGE | INTRAVENOUS | Status: DC | PRN
  Administered 2021-08-17: 80 mg via INTRAVENOUS

## 2021-08-17 MED ORDER — STERILE WATER FOR INJECTION IV SOLN
INTRAVENOUS | Status: DC
  Filled 2021-08-17 (×4): qty 1000

## 2021-08-17 MED ORDER — ONDANSETRON HCL 4 MG/2ML IJ SOLN
INTRAMUSCULAR | Status: AC
  Filled 2021-08-17: qty 2

## 2021-08-17 MED ORDER — CEFAZOLIN SODIUM-DEXTROSE 2-4 GM/100ML-% IV SOLN
2.0000 g | Freq: Three times a day (TID) | INTRAVENOUS | Status: DC
  Administered 2021-08-17: 2 g via INTRAVENOUS
  Filled 2021-08-17 (×3): qty 100

## 2021-08-17 MED ORDER — SUCCINYLCHOLINE CHLORIDE 200 MG/10ML IV SOSY
PREFILLED_SYRINGE | INTRAVENOUS | Status: AC
  Filled 2021-08-17: qty 10

## 2021-08-17 MED ORDER — CEFAZOLIN SODIUM-DEXTROSE 2-4 GM/100ML-% IV SOLN
2.0000 g | Freq: Three times a day (TID) | INTRAVENOUS | Status: DC
  Administered 2021-08-17 – 2021-08-18 (×3): 2 g via INTRAVENOUS
  Filled 2021-08-17 (×5): qty 100

## 2021-08-17 MED ORDER — 0.9 % SODIUM CHLORIDE (POUR BTL) OPTIME
TOPICAL | Status: DC | PRN
  Administered 2021-08-17: 700 mL

## 2021-08-17 MED ORDER — DEXAMETHASONE SODIUM PHOSPHATE 10 MG/ML IJ SOLN
INTRAMUSCULAR | Status: AC
  Filled 2021-08-17: qty 1

## 2021-08-17 MED ORDER — LACTATED RINGERS IV SOLN: INTRAVENOUS | Status: DC

## 2021-08-17 MED ORDER — SODIUM CHLORIDE 0.9 % IV BOLUS
500.0000 mL | Freq: Once | INTRAVENOUS | Status: AC
  Administered 2021-08-17: 500 mL via INTRAVENOUS

## 2021-08-17 MED ORDER — LIDOCAINE HCL (PF) 2 % IJ SOLN
INTRAMUSCULAR | Status: AC
  Filled 2021-08-17: qty 5

## 2021-08-17 MED ORDER — FENTANYL CITRATE (PF) 100 MCG/2ML IJ SOLN
INTRAMUSCULAR | Status: AC
  Filled 2021-08-17: qty 2

## 2021-08-17 MED ORDER — BUPIVACAINE-EPINEPHRINE (PF) 0.25% -1:200000 IJ SOLN
INTRAMUSCULAR | Status: AC
  Filled 2021-08-17: qty 30

## 2021-08-17 MED ORDER — VASOPRESSIN 20 UNIT/ML IV SOLN
INTRAVENOUS | Status: DC | PRN
  Administered 2021-08-17: 2 [IU] via INTRAVENOUS
  Administered 2021-08-17 (×2): 1 [IU] via INTRAVENOUS
  Administered 2021-08-17 (×2): 2 [IU] via INTRAVENOUS

## 2021-08-17 MED ORDER — PROPOFOL 10 MG/ML IV BOLUS
INTRAVENOUS | Status: DC | PRN
  Administered 2021-08-17: 70 mg via INTRAVENOUS

## 2021-08-17 MED ORDER — FENTANYL CITRATE (PF) 100 MCG/2ML IJ SOLN
INTRAMUSCULAR | Status: DC | PRN
  Administered 2021-08-17: 100 ug via INTRAVENOUS

## 2021-08-17 MED ORDER — LACTATED RINGERS IV BOLUS
500.0000 mL | Freq: Once | INTRAVENOUS | Status: AC
  Administered 2021-08-17: 500 mL via INTRAVENOUS

## 2021-08-17 MED ORDER — PHENYLEPHRINE HCL (PRESSORS) 10 MG/ML IV SOLN
INTRAVENOUS | Status: DC | PRN
  Administered 2021-08-17: 200 ug via INTRAVENOUS
  Administered 2021-08-17 (×3): 100 ug via INTRAVENOUS

## 2021-08-17 MED ORDER — SODIUM BICARBONATE 8.4 % IV SOLN
50.0000 meq | Freq: Once | INTRAVENOUS | Status: AC
  Administered 2021-08-17: 50 meq via INTRAVENOUS
  Filled 2021-08-17 (×2): qty 50

## 2021-08-17 MED ORDER — SODIUM CHLORIDE (PF) 0.9 % IJ SOLN
INTRAMUSCULAR | Status: AC
  Filled 2021-08-17: qty 20

## 2021-08-17 MED ORDER — PHENYLEPHRINE HCL-NACL 20-0.9 MG/250ML-% IV SOLN
INTRAVENOUS | Status: DC | PRN
  Administered 2021-08-17: 50 ug/min via INTRAVENOUS

## 2021-08-17 MED ORDER — DEXAMETHASONE SODIUM PHOSPHATE 10 MG/ML IJ SOLN
INTRAMUSCULAR | Status: DC | PRN
  Administered 2021-08-17: 10 mg via INTRAVENOUS

## 2021-08-17 MED ORDER — CHLORHEXIDINE GLUCONATE CLOTH 2 % EX PADS
6.0000 | MEDICATED_PAD | Freq: Every day | CUTANEOUS | Status: DC
  Administered 2021-08-17 – 2021-08-20 (×5): 6 via TOPICAL

## 2021-08-17 SURGICAL SUPPLY — 37 items
BIOPATCH WHT 1IN DISK W/4.0 H (GAUZE/BANDAGES/DRESSINGS) ×1 IMPLANT
BLADE SURG 15 STRL LF DISP TIS (BLADE) ×1 IMPLANT
BLADE SURG 15 STRL SS (BLADE) ×1
DRAIN CHANNEL JP 15F RND 16 (MISCELLANEOUS) ×1 IMPLANT
DRAIN PENROSE 12X.25 LTX STRL (MISCELLANEOUS) IMPLANT
DRAIN PENROSE 5/8X18 LTX STRL (DRAIN) IMPLANT
DRAPE LAPAROTOMY 77X122 PED (DRAPES) ×2 IMPLANT
DRSG TEGADERM 4X4.75 (GAUZE/BANDAGES/DRESSINGS) ×1 IMPLANT
DRSG TELFA 3X8 NADH (GAUZE/BANDAGES/DRESSINGS) ×2 IMPLANT
ELECT CAUTERY BLADE 6.4 (BLADE) ×2 IMPLANT
ELECT REM PT RETURN 9FT ADLT (ELECTROSURGICAL) ×2
ELECTRODE REM PT RTRN 9FT ADLT (ELECTROSURGICAL) ×1 IMPLANT
GAUZE 4X4 16PLY ~~LOC~~+RFID DBL (SPONGE) ×2 IMPLANT
GAUZE SPONGE 4X4 12PLY STRL (GAUZE/BANDAGES/DRESSINGS) ×1 IMPLANT
GLOVE SURG ORTHO LTX SZ7.5 (GLOVE) ×2 IMPLANT
GOWN STRL REUS W/ TWL LRG LVL3 (GOWN DISPOSABLE) ×2 IMPLANT
GOWN STRL REUS W/TWL LRG LVL3 (GOWN DISPOSABLE) ×3
KIT TURNOVER KIT A (KITS) ×2 IMPLANT
MANIFOLD NEPTUNE II (INSTRUMENTS) ×2 IMPLANT
NEEDLE HYPO 22GX1.5 SAFETY (NEEDLE) ×2 IMPLANT
NS IRRIG 1000ML POUR BTL (IV SOLUTION) ×2 IMPLANT
PACK BASIN MINOR ARMC (MISCELLANEOUS) ×2 IMPLANT
PAD ABD DERMACEA PRESS 5X9 (GAUZE/BANDAGES/DRESSINGS) ×1 IMPLANT
PAD DRESSING TELFA 3X8 NADH (GAUZE/BANDAGES/DRESSINGS) IMPLANT
SOL PREP PVP 2OZ (MISCELLANEOUS) ×2
SOLUTION PREP PVP 2OZ (MISCELLANEOUS) ×1 IMPLANT
SPONGE DRAIN TRACH 4X4 STRL 2S (GAUZE/BANDAGES/DRESSINGS) ×1 IMPLANT
SPONGE T-LAP 18X18 ~~LOC~~+RFID (SPONGE) ×3 IMPLANT
STRIP CLOSURE SKIN 1/2X4 (GAUZE/BANDAGES/DRESSINGS) ×1 IMPLANT
SUT ETHILON 3-0 FS-10 30 BLK (SUTURE)
SUT V-LOC 90 ABS DVC 3-0 CL (SUTURE) ×1 IMPLANT
SUT VLOC 90 2/L VL 12 GS22 (SUTURE) ×1 IMPLANT
SUTURE EHLN 3-0 FS-10 30 BLK (SUTURE) IMPLANT
SWAB CULTURE AMIES ANAERIB BLU (MISCELLANEOUS) ×2 IMPLANT
SYR 10ML LL (SYRINGE) ×2 IMPLANT
SYR BULB IRRIG 60ML STRL (SYRINGE) ×2 IMPLANT
WATER STERILE IRR 500ML POUR (IV SOLUTION) ×1 IMPLANT

## 2021-08-17 NOTE — ED Notes (Signed)
Pt difficult to arouse and unwilling to do breathing exercises or cough at this time.

## 2021-08-17 NOTE — Assessment & Plan Note (Addendum)
S/p irrigation and drainage by Luellen Pucker today - Stop cefazolin - Start Rocephin - Add vancomycin - Follow-up intraoperative cultures

## 2021-08-17 NOTE — ED Notes (Signed)
Patient resting quietly at this time.  Vital signs stable

## 2021-08-17 NOTE — Interval H&P Note (Signed)
History and Physical Interval Note:  08/17/2021 1:34 PM  Kylie Saunders  has presented today for surgery, with the diagnosis of post mastectomy seroma with infection.  The various methods of treatment have been discussed with the patient and family. After consideration of risks, benefits and other options for treatment, the patient has consented to  Procedure(s): INCISION AND DRAINAGE ABSCESS (Right) as a surgical intervention.  The patient's history has been reviewed, patient examined, no change in status, stable for surgery.  I have reviewed the patient's chart and labs.  Questions were answered to the patient's satisfaction.    The right post-mastectomy site is marked.   Ronny Bacon

## 2021-08-17 NOTE — Consult Note (Signed)
NAME:  Kylie Saunders, MRN:  [id number], DOB:  [birth year], LOS: 0 ADMISSION DATE:  08/16/2021, CONSULTATION DATE: 08/17/2021 REFERRING MD: Dr. Shirley Friar, CHIEF COMPLAINT: Somnolence    History of Present Illness:  This is a 60 yo female who presented to [facility] ER from a [place] via EMS on 09/27 with clear drainage, swelling, and redness around right simple mastectomy and sentinel lymph node biopsy surgical site which was performed on 07/27/21 by Dr. Christian Mate.  Her postoperative course was complicated requiring [place] admission from 09/8~09/10 following treatment of severe sepsis along with acute hypoxic respiratory failure secondary to pneumonia; hypotension secondary to sepsis vs. hemorrhagic shock/bloody drainage from JP drain (however JP drain inadvertently removed 09/9, however site appeared to be healing well, and JP drain not replaced), AKI, and acute metabolic encephalopathy.    During outpatient follow-up with Dr. Christian Mate on 09/14 she developed a postmastectomy seroma with no evidence of infection at that time, and did not want Dr. Christian Mate to intervene.  Therefore, per Dr. Forest Becker progress note decision made to wait on pathology reports to determine if any further surgeries were indicated.  Pathology report revealed multiple positive lymph nodes.  Dr. Christian Mate discussed findings with pts brother Saralyn Pilar they elected not to pursue any more aggressive surgery or intervention and did not plan to proceed with chemotherapy, but utilize less intrusive means to continue her treatment.    ED course: Upon arrival to the ER provider noted a copious amount of drainage from right incision site with erythema, however the wound was not dehisced.  Lab results revealed Na+ 134, K+ 3.4, glucose 138, BUN 44, creatinine 2.53, lactic acid 1.0, and hgb 10.5.  UA with trace leukocytes, rare bacteria, and 11-20 wbc. COVID-19/Influenza PCR negative, CXR concerning for cardiomegaly with trace pleural effusions. CT  Chest concerning for postoperative changes of recent modified radical mastectomy with postoperative fluid and gas of the right chest wall.  Pts temp 99.3 degrees F with occasional maps <65.  She received 1L LR bolus, ceftriaxone, and vancomycin.  She was subsequently admitted to the [facility] unit for observation per hospitalist team.    General surgery consulted 09/28 and following concerns of worsening infection Dr. Christian Mate discussed taking pt to the OR for open drainage, irrigation, and replacement of closed drainage system with the pt and her brother Saralyn Pilar.  They both agreed with recommendations.  Postop pt hypotensive with acute hypoxic hypercapnic respiratory failure and acute encephalopathy secondary to sedating medication further complicated by undiagnosed OSA/OHS requiring Bipap.  Pt transferred to the [facility] unit PCCM consulted to assist with management.    Pertinent  Medical History  Breast cancer status post right mastectomy on 07/27/2021 Hypertension Hyperlipidemia Obesity Pancreatitis Paranoid schizophrenia Anxiety Intellectual disability Right eye injury GERD  Significant Hospital Events: Including procedures, antibiotic start and stop dates in addition to other pertinent events   09/27: Pt admitted to the medsurg unit per hospitalist team with infected/abscessed right postmastectomy seroma  09/27: CT Chest revealed postoperative changes of recent modified radical mastectomy with postoperative fluid and gas in the right chest wall, as detailed above. Aortic atherosclerosis, in addition to 3 vessel coronary artery disease. Please note that although the presence of coronary artery calcium documents the presence of coronary artery disease, the severity of this disease and any potential stenosis cannot be assessed on this non-gated CT examination. Assessment for potential risk factor modification, dietary therapy or pharmacologic therapy may be warranted, if clinically  indicated. 09/28: Pt underwent open drainage, irrigation and  replacement of closed drainage system for infected right post mastectomy seroma.  However, postop pt developed hypotension with acute hypoxic hypercapnic respiratory failure and acute encephalopathy secondary to sedating medication further complicated by undiagnosed OSA/OHS requiring Bipap.  PCCM consulted to assist with management   Interim History / Subjective:  Pt somnolent with intermittent low TV's 150~200's on continuous Bipap settings: IPAP 20/EPAP 8/FiO2 35%.  Pt intermittently following commands with repeated stimulation.  Objective   Blood pressure (!) 92/52, pulse 88, temperature (!) 97 F (36.1 C), resp. rate 14, height 4\' 11"  (1.499 m), weight 79.8 kg, SpO2 (!) 82 %.        Intake/Output Summary (Last 24 hours) at 08/17/2021 1732 Last data filed at 08/17/2021 1724 Gross per 24 hour  Intake 2200 ml  Output 250 ml  Net 1950 ml   Filed Weights   08/17/21 1337  Weight: 79.8 kg    Examination: General: acutely ill appearing female, somnolent on Bipap HENT: supple, no JVD  Lungs: diminished throughout, even, non labored, periods of apnea  Cardiovascular:  nsr, rrr, no R/G, 2+ radial/1+ distal pulses, trace generalized edema  Abdomen: +BS x4, soft, obese, non tender, non distended  Skin: right mastectomy incision site s/p I&D dressing dry/intact with JP drain in place  Extremities: normal bulk and tone, moves all extremities  Neuro: somnolent, follows commands with repeated stimulation, left pupil 2 mm equal/reactive, right eye blindness  GU: no foley in place   Resolved Hospital Problem list     Assessment & Plan:  Postop acute hypoxic hypercapnic respiratory failure secondary to sedating medications complicated by undiagnosed OSA/OHS Trace pleural effusions  - Prn Bipap for hypoxia and/or hypercapnia  - May need Bipap or CPAP qhs  - Aggressive pulmonary hygiene   Hypotension secondary to sepsis  Hx: HTN  and HLD  - Continuous telemetry monitoring  - Aggressive fluid resuscitation to maintain map >65 - Continue simvastatin   Acute renal failure with metabolic acidosis  Hyponatremia  - Trend BMP  - Replace electrolytes as indicated  - Monitor UOP - Avoid nephrotoxic medications  - Will start sodium bicarb gtt   Infected/abscessed right postmastectomy seroma s/p open drainage/irrigation/and replacement of closed drainage system  - Trend WBC and monitor fever curve - Trend PCT  - Follow cultures  - Continue vancomycin and cefazolin  - General Surgery consulted appreciate input   Anemia without obvious acute blood loss  - Trend CBC  - Monitor for s/sx of bleeding  - Transfuse for hgb <7  GERD - Continue protonix   Acute encephalopathy secondary to sedating medication during surgical procedure  - Frequent reorientation   Paranoid schizophrenia  Hx: Intellectual disability  - Once mentation improves resume outpatient risperdal, lorazepam, and congentin    Best Practice (right click and "Reselect all SmartList Selections" daily)   Diet/type: Regular consistency (see orders); resume once mentation improves  DVT prophylaxis: LMWH GI prophylaxis: PPI Lines: N/A Foley:  N/A Code Status:  DNR Last date of multidisciplinary goals of care discussion [N/A]  Labs   CBC: Recent Labs  Lab 08/16/21 1351 08/17/21 0528  WBC 7.1 7.1  NEUTROABS 3.7  --   HGB 10.5* 9.9*  HCT 30.7* 30.4*  MCV 89.2 91.8  PLT 257 161    Basic Metabolic Panel: Recent Labs  Lab 08/16/21 1351 08/17/21 0528  NA 133* 134*  K 3.4* 3.9  CL 96* 98  CO2 26 26  GLUCOSE 138* 119*  BUN 44* 36*  CREATININE  2.53* 1.83*  CALCIUM 8.1* 8.0*   GFR: Estimated Creatinine Clearance: 29.1 mL/min (A) (by C-G formula based on SCr of 1.83 mg/dL (H)). Recent Labs  Lab 08/16/21 1250 08/16/21 1351 08/16/21 1752 08/17/21 0528  WBC  --  7.1  --  7.1  LATICACIDVEN 0.9  --  1.0  --     Liver Function  Tests: Recent Labs  Lab 08/16/21 1351  AST 10*  ALT 12  ALKPHOS 59  BILITOT 0.8  PROT 6.6  ALBUMIN 2.8*   No results for input(s): LIPASE, AMYLASE in the last 168 hours. No results for input(s): AMMONIA in the last 168 hours.  ABG    Component Value Date/Time   PHART 7.26 (L) 08/17/2021 1625   PCO2ART 53 (H) 08/17/2021 1625   PO2ART 137 (H) 08/17/2021 1625   HCO3 23.8 08/17/2021 1625   ACIDBASEDEF 3.7 (H) 08/17/2021 1625   O2SAT 98.7 08/17/2021 1625     Coagulation Profile: Recent Labs  Lab 08/17/21 0855  INR 1.1    Cardiac Enzymes: No results for input(s): CKTOTAL, CKMB, CKMBINDEX, TROPONINI in the last 168 hours.  HbA1C: No results found for: HGBA1C  CBG: No results for input(s): GLUCAP in the last 168 hours.  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   Surgical History:   Past Surgical History:  Procedure Laterality Date   ABDOMINAL HYSTERECTOMY  07/25/2012   with BSO   AXILLARY LYMPH NODE BIOPSY Right 07/27/2021   Procedure: AXILLARY LYMPH NODE BIOPSY;  Surgeon: Ronny Bacon, MD;  Location: [facility] ORS;  Service: General;  Laterality: Right;   BREAST BIOPSY Right 07/07/2021   [place] bx mass, venus marker, path pending   CHOLECYSTECTOMY N/A 01/24/2017   Procedure: LAPAROSCOPIC CHOLECYSTECTOMY WITH INTRAOPERATIVE CHOLANGIOGRAM;  Surgeon: Florene Glen, MD;  Location: [facility] ORS;  Service: General;  Laterality: N/A;   EYE SURGERY     SIMPLE MASTECTOMY WITH AXILLARY SENTINEL NODE BIOPSY Right 07/27/2021   Procedure: SIMPLE MASTECTOMY;   Surgeon: Ronny Bacon, MD;  Location: [facility] ORS;  Service: General;  Laterality: Right;   TONSILLECTOMY       Social History:   reports that she has never smoked. She has never used smokeless tobacco. She reports that she does not drink alcohol and does not use drugs.   Family History:  Her family history is negative for Diabetes and Hypertension.   Allergies No Known Allergies   Home Medications  Prior to Admission medications   Medication Sig Start Date End Date Taking? Authorizing Provider  amLODipine (NORVASC) 5 MG tablet Take 5 mg by mouth daily.   Yes [provider]  aspirin 81 MG chewable tablet CHEW ONE TABLET BY MOUTH EVERY DAY 08/12/18  Yes [provider]  benztropine (COGENTIN) 2 MG tablet Take 1 tablet (2 mg total) by mouth daily. 06/26/21 09/24/21 Yes Hisada, Elie Goody, MD  lisinopril-hydrochlorothiazide (PRINZIDE,ZESTORETIC) 20-12.5 MG tablet Take 0.5 tablets by mouth daily.   Yes [provider]  loratadine (CLARITIN) 10 MG tablet Take 10 mg by mouth daily.   Yes [provider]  LORazepam (ATIVAN) 0.5 MG tablet Take 1 tablet (0.5 mg total) by mouth 2 (two)  times daily. 06/26/21 09/24/21 Yes Hisada, Elie Goody, MD  omeprazole (PRILOSEC) 40 MG capsule Take 40 mg by mouth daily.   Yes [provider]  Polyvinyl Alcohol-Povidone (REFRESH OP) Apply 1 application to eye 2 (two) times daily. Right eye only   Yes [provider]  risperiDONE (RISPERDAL) 2 MG tablet Take 1 tablet (2 mg total) by mouth 2 (two) times daily. 06/26/21 09/24/21 Yes Hisada, Elie Goody, MD  simvastatin (ZOCOR) 40 MG tablet Take 40 mg by mouth daily.  05/31/12  Yes [provider]  HYDROcodone-acetaminophen (NORCO/VICODIN) 5-325 MG tablet Take 1 tablet by mouth every 6 (six) hours as needed for moderate pain. Patient not taking: No sig reported 07/27/21   Ronny Bacon, MD  ibuprofen (ADVIL) 800 MG tablet Take 1 tablet (800 mg total) by mouth every 8 (eight)  hours as needed. 07/27/21   Ronny Bacon, MD     Critical care time: 1 hour     Rosilyn Mings, [place] Pager [phone] (please enter 7 digits) PCCM Consult Pager [phone] (please enter 7 digits)

## 2021-08-17 NOTE — Assessment & Plan Note (Addendum)
-   Continue statin -Continue low-dose aspirin

## 2021-08-17 NOTE — ED Notes (Signed)
Patient reminded of need for urine sample.  External catheter offered.  Patient states she does not want one and will notify a staff member when she needs to provide a sample

## 2021-08-17 NOTE — Progress Notes (Signed)
  This patient is well-known to me from her prior right-sided mastectomy and sentinel lymph node biopsy 3 weeks ago.  Her postoperative course was complicated by presentation to the ED and admission to the hospital for pneumonia.  Inadvertently lost her drain at that time.  In follow-up in the office she had developed a postmastectomy seroma.  There is no evidence of infection at that time, and she was quite unconcerned about it and did not want me to intervene.  So we proceeded to wait for pathology reports to determine if there is any further surgery to be done. Upon review of her pathology she had multiple positive lymph nodes, but upon discussion with her brother Saralyn Pilar, he is well versed with the pathology report having reviewed it with the oncologist.  They have elected not to pursue any more aggressive surgery or intervention at this time.  Also there is no plan for chemotherapy but to utilize less intrusive means to continue her treatment. However she return to the ER, and has been observed for some acute renal insufficiency.  She has spontaneously been draining from the right breast seroma, and upon exam I note she has significantly lost volume there.  There is some evidence of erythema and irritation so I suspect that she has a secondary or perhaps a primary infection of the seroma, not sure what came first. I called Saralyn Pilar and confirmed our concerns that if we did not return to the OR to irrigate and replace the drain that we may continue to smolder with infection, I discussed it also with Stanton Kidney and we will proceed with return to the OR for irrigation of right mastectomy site with replacement of drain. Risks reviewed and accepted.

## 2021-08-17 NOTE — Hospital Course (Addendum)
Ms Kylie Saunders is a 60 y.o. 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  In the ER, the site appeared red.  BP 96 systolic, HR 501, RR 21.   9/27: Admitted and started on fluids, antibiotics 9/28: General surgery consulted, Dr. Christian Mate took to the OR for I&D and washout, cultures sent.  Post-op, patient lethargic, ABG showed respiratory acidosis, mentation improved after few hours BiPAP 9/29-9/30: Transferred OOU, weaned to 2L, feeling better, cultures pending

## 2021-08-17 NOTE — Op Note (Addendum)
Open drainage, irrigation and replacement of closed drainage system for infected right post mastectomy seroma.  Pre-operative Diagnosis: Infected/abscessed right postmastectomy seroma.  Post-operative Diagnosis: same.    Surgeon: Ronny Bacon, M.D., FACS  Anesthesia: General  Findings: Large volume residual purulent drainage independent portion of remaining postmastectomy cavity.  Draining from mid incision.  Estimated Blood Loss: 5 mL         Specimens: Fluid from wound cavity for culture and sensitivity          Complications: none              Procedure Details  The patient was seen again in the Holding Room. The benefits, complications, treatment options, and expected outcomes were discussed with the patient. The risks of bleeding, infection, recurrence of symptoms, failure to resolve symptoms, unanticipated injury, prosthetic placement, prosthetic infection, any of which could require further surgery were reviewed with the patient. The likelihood of improving the patient's symptoms with return to their baseline status is expected.  The patient and/or family concurred with the proposed plan, giving informed consent.  The patient was taken to Operating Room, identified and the procedure verified.    Prior to the induction of general anesthesia, antibiotic prophylaxis was administered. VTE prophylaxis was in place.  General anesthesia was then administered and tolerated well. After the induction, the patient was positioned in the supine position and the right postmastectomy/chest wall area was prepped with  Chloraprep and draped in the sterile fashion.  A Time Out was held and the above information confirmed. Through the spontaneously dehisced site secondary to the infection, we aspirated the residual liquid contents of the postmastectomy cavity, obtaining a good specimen for culture and sensitivity. I then proceeded with cutting some of the dermal running suture, and opening the cavity  for mechanical debridement. Multiple dry sponges were utilized to abrade/remove residual biofilm from the cavity.  Irrigation was utilized as well. A 15 Blake drain is placed through a stab incision in the most dependent portion of the infra axillary skin.  The drain was secured with two 3-0 nylon sutures. I then reclosed the cavity by reapproximating the skin flaps with a running 3-0 V-lock.  Then applied Steri-Strips to reinforce. Drain dressing and right chest wall dressings applied.  Patient tolerated procedure well.       Ronny Bacon M.D., [facility] 08/17/2021 3:08 PM

## 2021-08-17 NOTE — Assessment & Plan Note (Signed)
-   Continue amlodipine ?

## 2021-08-17 NOTE — Assessment & Plan Note (Signed)
Creatinine greater than 2 on admission, now improving with fluids - Continue IV fluids - Hold lisinopril - Hold HCTZ - Hold nephrotoxins

## 2021-08-17 NOTE — Anesthesia Postprocedure Evaluation (Signed)
Anesthesia Post Note  Patient: Kylie Saunders  Procedure(s) Performed: INCISION AND DRAINAGE ABSCESS (Right)  Patient location during evaluation: PACU Anesthesia Type: General Level of consciousness: lethargic, confused and responds to stimulation (Pt arousable with baseline level of communication for brief periods with baseline responses to questions. However, after lack of stimulation, will quickly become somnolent and have airway obstruction) Pain management: pain level controlled Vital Signs Assessment: vitals unstable Respiratory status: Pt was desaturating on [place] due to obstruction and mouth breathing. ABG 02 was 137 on 6L Mask. BIPAP was ordered to assist. Cardiovascular status: unstable (Fluid Bolus being administered for hypotension) Postop Assessment: no apparent nausea or vomiting Anesthetic complications: no Comments: Bedside TTE revealed non-distended right and left ventricles grossly with normal function. IVC collapsed with inspiration.  Pt will be observed and evaluated further in the ICU for acidosis and CPAP/BIPAP needs while somnolent. Labs have been drawn.   No notable events documented.   Last Vitals:  Vitals:   08/17/21 1913 08/17/21 2000  BP: 114/63 139/81  Pulse: 64 79  Resp: (!) 0 13  Temp:    SpO2: 96% 98%    Last Pain:  Vitals:   08/17/21 1900  TempSrc: Axillary  PainSc:                  [place] Ouch

## 2021-08-17 NOTE — Anesthesia Procedure Notes (Signed)
Procedure Name: Intubation Date/Time: 08/17/2021 2:05 PM Performed by: Rona Ravens, CRNA Pre-anesthesia Checklist: Patient identified, Patient being monitored, Timeout performed, Emergency Drugs available and Suction available Patient Re-evaluated:Patient Re-evaluated prior to induction Oxygen Delivery Method: Circle system utilized Preoxygenation: Pre-oxygenation with 100% oxygen Induction Type: IV induction Ventilation: Mask ventilation without difficulty Laryngoscope Size: McGraph and 4 Grade View: Grade II Tube type: Oral Tube size: 7.0 mm Number of attempts: 1 Airway Equipment and Method: Bougie stylet Placement Confirmation: ETT inserted through vocal cords under direct vision, positive ETCO2 and breath sounds checked- equal and bilateral Secured at: 21 cm Tube secured with: Tape Dental Injury: Teeth and Oropharynx as per pre-operative assessment  Difficulty Due To: Difficult Airway- due to anterior larynx, Difficult Airway- due to reduced neck mobility and Difficulty was unanticipated

## 2021-08-17 NOTE — Transfer of Care (Signed)
Immediate Anesthesia Transfer of Care Note  Patient: Kylie Saunders  Procedure(s) Performed: INCISION AND DRAINAGE ABSCESS (Right)  Patient Location: PACU  Anesthesia Type:General  Level of Consciousness: sedated  Airway & Oxygen Therapy: Patient Spontanous Breathing and Patient connected to face mask oxygen  Post-op Assessment: Report given to RN and Post -op Vital signs reviewed and stable  Post vital signs: Reviewed and stable  Last Vitals:  Vitals Value Taken Time  BP 107/29 08/17/21 1508  Temp    Pulse    Resp 21 08/17/21 1512  SpO2    Vitals shown include unvalidated device data.  Last Pain:  Vitals:   08/17/21 1121  TempSrc: Oral  PainSc:          Complications: No notable events documented.

## 2021-08-17 NOTE — Assessment & Plan Note (Signed)
-   Continue Risperdal and Cogentin

## 2021-08-17 NOTE — Progress Notes (Signed)
  Progress Note    Kylie Saunders   SEL:[id number]  DOB: 1960/12/20  DOA: 08/16/2021     0 Date of Service: 08/17/2021     Subjective:  Denies headache, chest pain, abdominal pain, dyspnea.  She is having drainage from her chest wound, feels generalized malaise.  Hospital Problems AKI (acute kidney injury) ([place]) Creatinine greater than 2 on admission, now improving with fluids - Continue IV fluids - Hold lisinopril - Hold HCTZ - Hold nephrotoxins  Superficial postoperative wound infection S/p irrigation and drainage by Luellen Pucker today - Stop cefazolin - Start Rocephin - Add vancomycin - Follow-up intraoperative cultures  Hypokalemia Magnesium normal - Supplement potassium  Essential hypertension Continue amlodipine  Schizophrenia, paranoid (HCC) - Continue Risperdal and Cogentin  HLD (hyperlipidemia) - Continue statin -Continue low-dose aspirin     Objective Vital signs were reviewed and unremarkable except for: Pulse: Low 100s   Exam Physical Exam Vitals reviewed.  Constitutional:      Appearance: She is obese.  HENT:     Head: Normocephalic and atraumatic.     Nose: Nose normal.     Mouth/Throat:     Mouth: Mucous membranes are moist.     Pharynx: No oropharyngeal exudate or posterior oropharyngeal erythema.  Eyes:     General: No scleral icterus.    Extraocular Movements: Extraocular movements intact.  Cardiovascular:     Rate and Rhythm: Regular rhythm. Tachycardia present.     Heart sounds: No murmur heard.   No gallop.  Pulmonary:     Effort: Pulmonary effort is normal. No respiratory distress.     Breath sounds: No wheezing or rales.  Abdominal:     General: Abdomen is flat. There is no distension.     Tenderness: There is no abdominal tenderness. There is no guarding.  Skin:    Capillary Refill: Capillary refill takes less than 2 seconds.     Comments: Redness and clear drainage around right mastectomy wound  Neurological:     General: No  focal deficit present.     Mental Status: She is alert and oriented to person, place, and time.  Psychiatric:        Mood and Affect: Mood normal.        Behavior: Behavior normal.        Thought Content: Thought content normal.     Comments: Neurological disability is noted, although she is oriented and appropriate.      Labs / Other Information My review of labs, imaging, notes and other tests is significant for Improving creatinine calcium, normal hemoglobin     Time spent: 25 minutes [facility] 08/17/2021, 3:26 PM

## 2021-08-17 NOTE — ED Notes (Signed)
Patient resting in bed at this time.  No complaints voiced at this time

## 2021-08-17 NOTE — H&P (View-Only) (Signed)
  This patient is well-known to me from her prior right-sided mastectomy and sentinel lymph node biopsy 3 weeks ago.  Her postoperative course was complicated by presentation to the ED and admission to the hospital for pneumonia.  Inadvertently lost her drain at that time.  In follow-up in the office she had developed a postmastectomy seroma.  There is no evidence of infection at that time, and she was quite unconcerned about it and did not want me to intervene.  So we proceeded to wait for pathology reports to determine if there is any further surgery to be done. Upon review of her pathology she had multiple positive lymph nodes, but upon discussion with her brother Saralyn Pilar, he is well versed with the pathology report having reviewed it with the oncologist.  They have elected not to pursue any more aggressive surgery or intervention at this time.  Also there is no plan for chemotherapy but to utilize less intrusive means to continue her treatment. However she return to the ER, and has been observed for some acute renal insufficiency.  She has spontaneously been draining from the right breast seroma, and upon exam I note she has significantly lost volume there.  There is some evidence of erythema and irritation so I suspect that she has a secondary or perhaps a primary infection of the seroma, not sure what came first. I called Saralyn Pilar and confirmed our concerns that if we did not return to the OR to irrigate and replace the drain that we may continue to smolder with infection, I discussed it also with Stanton Kidney and we will proceed with return to the OR for irrigation of right mastectomy site with replacement of drain. Risks reviewed and accepted.

## 2021-08-17 NOTE — Plan of Care (Signed)
  Problem: Education: Goal: Knowledge of General Education information will improve Description: Including pain rating scale, medication(s)/side effects and non-pharmacologic comfort measures Outcome: Progressing   Problem: Health Behavior/Discharge Planning: Goal: Ability to manage health-related needs will improve Outcome: Progressing   Problem: Clinical Measurements: Goal: Ability to maintain clinical measurements within normal limits will improve Outcome: Progressing Goal: Diagnostic test results will improve Outcome: Progressing Goal: Cardiovascular complication will be avoided Outcome: Progressing   Problem: Coping: Goal: Level of anxiety will decrease Outcome: Progressing   Problem: Pain Managment: Goal: General experience of comfort will improve Outcome: Progressing   Problem: Safety: Goal: Ability to remain free from injury will improve Outcome: Progressing

## 2021-08-17 NOTE — Progress Notes (Signed)
Spoke with Anesthesiology, Dr Wynetta Emery.  Post-op, patient is slow to wake up and hypotensive.  WBC and lactate normal yesterday, treated overnight last night with cefazolin.  Also, body habitus implies OSA, but not treated.  Some desats overnight in the ER, CT chest clear yesterday.  ABG now 7.26, pco2 52, bicarb normal and sluggish.  On Bipap and no quick improvement.  Also, hypotensive, MAP 65.  Broadened abx to vanc/rocephin.  Give fluids now.  Continue Bipap and transfer to stepdown.  Discussed with Dr. Mortimer Fries, CCM will evaluate patient and we will decide re; intubation or continue BiPAP and repeat ABG.

## 2021-08-17 NOTE — Anesthesia Preprocedure Evaluation (Addendum)
Anesthesia Evaluation  Patient identified by MRN, date of birth, ID band Patient awake and Patient confused  General Assessment Comment:Pt states that "she does not know" to many questions. She know she is here for breast surgery.  Reviewed: Allergy & Precautions, NPO status , Patient's Chart, lab work & pertinent test results  History of Anesthesia Complications Negative for: history of anesthetic complications  Airway Mallampati: III  TM Distance: >3 FB Neck ROM: Full   Comment: Lesion on left lower lip Dental  (+) Poor Dentition   Pulmonary neg pulmonary ROS, neg sleep apnea, neg COPD,    breath sounds clear to auscultation- rhonchi (-) wheezing      Cardiovascular Exercise Tolerance: Poor hypertension, Pt. on medications + CAD ( 3V CAD found of CT scan)  (-) Past MI, (-) Cardiac Stents and (-) CABG  Rhythm:Regular Rate:Normal - Systolic murmurs and - Diastolic murmurs    Neuro/Psych neg Seizures PSYCHIATRIC DISORDERS Anxiety Schizophrenia Brother states pt "acts childlike and has the mental capacity of a 60 year old"negative neurological ROS     GI/Hepatic Neg liver ROS, GERD  ,  Endo/Other  negative endocrine ROSneg diabetes  Renal/GU Renal disease (AKI BUN/CR normal)     Musculoskeletal negative musculoskeletal ROS (+)   Abdominal (+) + obese,   Peds  Hematology negative hematology ROS (+)   Anesthesia Other Findings 07/2021: S/P SIMPLE MASTECTOMY WITH AXILLARY SENTINEL NODE BIOPSY  Pt now with post mastectomy seroma with infection. She received IV ceftriaxone and vancomycin as well as 1 L bolus of IV lactated Ringer while in the ER 9/27.  ED workup 08/16/2021: EKG as reviewed by me :  EKG showed sinus tachycardia with a rate of 102 and nonspecific interventricular conduction delay and Q waves anteroseptally. Imaging: Chest x-ray showed cardiomegaly with trace pleural effusions. Chest CT without contrast  revealed the following: 1. Postoperative changes of recent modified radical mastectomy with postoperative fluid and gas in the right chest wall, as detailed above. 2. Aortic atherosclerosis, in addition to 3 vessel coronary artery disease. Please note that although the presence of coronary artery calcium documents the presence of coronary artery disease, the severity of this disease and any potential stenosis cannot be assessed on this non-gated CT examination.   Past Medical History: No date: Anxiety 05/31/2012: Cancer of body of uterus ([place])     Comment:  Endometrial cancer, had hysterectomy w/BSO 07/25/12 No date: GERD (gastroesophageal reflux disease) 12/14/2020: History of 2019 novel coronavirus disease (COVID-19) 07/25/2012: HLD (hyperlipidemia) No date: Hypertension 07/25/2012: Intellectual disability No date: Obesity No date: Pancreatitis due to biliary obstruction No date: Paranoid schizophrenia ([place]) 11/03/2016: Right eye injury   Reproductive/Obstetrics                           Anesthesia Physical  Anesthesia Plan  ASA: 3  Anesthesia Plan: General   Post-op Pain Management:    Induction: Intravenous  PONV Risk Score and Plan: 2  Airway Management Planned: Oral ETT  Additional Equipment:   Intra-op Plan:   Post-operative Plan: Extubation in OR  Informed Consent: I have reviewed the patients History and Physical, chart, labs and discussed the procedure including the risks, benefits and alternatives for the proposed anesthesia with the patient or authorized representative who has indicated his/her understanding and acceptance.     Dental advisory given and Consent reviewed with POA  Plan Discussed with: Anesthesiologist and CRNA  Anesthesia Plan Comments:  Anesthesia Quick Evaluation  

## 2021-08-17 NOTE — Progress Notes (Addendum)
Pharmacy Antibiotic Note  Kylie Saunders is a 60 y.o. female admitted on 08/16/2021 with cellulitis with abscess of infected right post mastectomy seroma.  Pharmacy has been consulted for vancomycin dosing.  S/p irrigation and drainage. Given vancomycin 1750 mg loading dose 9/27 @2215 . Scr improved from 2.53 to 1.83 with baseline Scr 0.82. Given AKI, will dose based on random levels.    Plan: Random vanc level ordered for tomorrow morning with AM labs (0500 9/29), based on current predicted 36 hour dosing interval.  Monitor renal function, clinical course, and LOT.   Height: 4\' 11"  (149.9 cm) Weight: 79.8 kg (175 lb 14.8 oz) IBW/kg (Calculated) : 43.2  Temp (24hrs), Avg:98.9 F (37.2 C), Min:97 F (36.1 C), Max:101.4 F (38.6 C)  Recent Labs  Lab 08/16/21 1250 08/16/21 1351 08/16/21 1752 08/17/21 0528  WBC  --  7.1  --  7.1  CREATININE  --  2.53*  --  1.83*  LATICACIDVEN 0.9  --  1.0  --     Estimated Creatinine Clearance: 29.1 mL/min (A) (by C-G formula based on SCr of 1.83 mg/dL (H)).    No Known Allergies  Antimicrobials this admission: Cefazolin 9/28 >>  Ceftriaxone 9/27 >> 9/27 Vancomycin 9/27 >>  Dose adjustments this admission: AKI, CTM and adjust PRN   Microbiology results: 9/28 BCx: sent/pending 9/28 Sx (deep wound) - sent/pending 9/28 COV/FLU - negative  Thank you for allowing pharmacy to be a part of this patient's care.    Wynelle Cleveland, PharmD Pharmacy Resident  08/17/2021 5:36 PM

## 2021-08-17 NOTE — Assessment & Plan Note (Signed)
Magnesium normal - Supplement potassium

## 2021-08-18 ENCOUNTER — Encounter: Payer: Self-pay | Admitting: Surgery

## 2021-08-18 ENCOUNTER — Other Ambulatory Visit (HOSPITAL_COMMUNITY): Payer: Self-pay

## 2021-08-18 ENCOUNTER — Encounter: Payer: Self-pay | Admitting: Oncology

## 2021-08-18 ENCOUNTER — Telehealth: Payer: Self-pay | Admitting: Pharmacy Technician

## 2021-08-18 ENCOUNTER — Telehealth: Payer: Self-pay

## 2021-08-18 DIAGNOSIS — G9341 Metabolic encephalopathy: Secondary | ICD-10-CM

## 2021-08-18 DIAGNOSIS — E871 Hypo-osmolality and hyponatremia: Secondary | ICD-10-CM | POA: Diagnosis present

## 2021-08-18 DIAGNOSIS — Z170 Estrogen receptor positive status [ER+]: Secondary | ICD-10-CM

## 2021-08-18 DIAGNOSIS — J9601 Acute respiratory failure with hypoxia: Secondary | ICD-10-CM

## 2021-08-18 DIAGNOSIS — J9602 Acute respiratory failure with hypercapnia: Secondary | ICD-10-CM

## 2021-08-18 DIAGNOSIS — C50811 Malignant neoplasm of overlapping sites of right female breast: Secondary | ICD-10-CM

## 2021-08-18 DIAGNOSIS — D649 Anemia, unspecified: Secondary | ICD-10-CM | POA: Diagnosis present

## 2021-08-18 LAB — BASIC METABOLIC PANEL
Anion gap: 11 (ref 5–15)
BUN: 33 mg/dL — ABNORMAL HIGH (ref 8–23)
CO2: 28 mmol/L (ref 22–32)
Calcium: 8.5 mg/dL — ABNORMAL LOW (ref 8.9–10.3)
Chloride: 99 mmol/L (ref 98–111)
Creatinine, Ser: 1.57 mg/dL — ABNORMAL HIGH (ref 0.44–1.00)
GFR, Estimated: 37 mL/min — ABNORMAL LOW (ref >60)
Glucose, Bld: 142 mg/dL — ABNORMAL HIGH (ref 70–99)
Potassium: 4.2 mmol/L (ref 3.5–5.1)
Sodium: 138 mmol/L (ref 135–145)

## 2021-08-18 LAB — CBC WITH DIFFERENTIAL/PLATELET
Abs Immature Granulocytes: 0.17 10*3/uL — ABNORMAL HIGH (ref 0.00–0.07)
Basophils Absolute: 0.1 10*3/uL (ref 0.0–0.1)
Basophils Relative: 1 %
Eosinophils Absolute: 0 10*3/uL (ref 0.0–0.5)
Eosinophils Relative: 0 %
HCT: 30.5 % — ABNORMAL LOW (ref 36.0–46.0)
Hemoglobin: 9.8 g/dL — ABNORMAL LOW (ref 12.0–15.0)
Immature Granulocytes: 3 %
Lymphocytes Relative: 15 %
Lymphs Abs: 1 10*3/uL (ref 0.7–4.0)
MCH: 29.5 pg (ref 26.0–34.0)
MCHC: 32.1 g/dL (ref 30.0–36.0)
MCV: 91.9 fL (ref 80.0–100.0)
Monocytes Absolute: 0.8 10*3/uL (ref 0.1–1.0)
Monocytes Relative: 11 %
Neutro Abs: 4.8 10*3/uL (ref 1.7–7.7)
Neutrophils Relative %: 70 %
Platelets: 230 10*3/uL (ref 150–400)
RBC: 3.32 MIL/uL — ABNORMAL LOW (ref 3.87–5.11)
RDW: 14.6 % (ref 11.5–15.5)
Smear Review: NORMAL
WBC: 6.8 10*3/uL (ref 4.0–10.5)
nRBC: 0 % (ref 0.0–0.2)

## 2021-08-18 LAB — VANCOMYCIN, RANDOM: Vancomycin Rm: 7

## 2021-08-18 LAB — PROCALCITONIN: Procalcitonin: 1.54 ng/mL

## 2021-08-18 LAB — MAGNESIUM: Magnesium: 2.2 mg/dL (ref 1.7–2.4)

## 2021-08-18 LAB — PHOSPHORUS: Phosphorus: 4.7 mg/dL — ABNORMAL HIGH (ref 2.5–4.6)

## 2021-08-18 MED ORDER — VANCOMYCIN HCL 1250 MG/250ML IV SOLN
1250.0000 mg | Freq: Once | INTRAVENOUS | Status: AC
  Administered 2021-08-18: 1250 mg via INTRAVENOUS
  Filled 2021-08-18: qty 250

## 2021-08-18 MED ORDER — VANCOMYCIN VARIABLE DOSE PER UNSTABLE RENAL FUNCTION (PHARMACIST DOSING): Status: DC

## 2021-08-18 MED ORDER — SODIUM CHLORIDE 0.9 % IV SOLN
1.0000 g | INTRAVENOUS | Status: DC
  Administered 2021-08-18 – 2021-08-20 (×3): 1 g via INTRAVENOUS
  Filled 2021-08-18: qty 10
  Filled 2021-08-18: qty 1
  Filled 2021-08-18: qty 10
  Filled 2021-08-18 (×2): qty 1

## 2021-08-18 MED ORDER — LACTATED RINGERS IV SOLN: INTRAVENOUS | Status: DC

## 2021-08-18 MED ORDER — ENOXAPARIN SODIUM 40 MG/0.4ML IJ SOSY
40.0000 mg | PREFILLED_SYRINGE | INTRAMUSCULAR | Status: DC
Start: 2021-08-18 — End: 2021-08-20
  Administered 2021-08-18 – 2021-08-19 (×2): 40 mg via SUBCUTANEOUS
  Filled 2021-08-18 (×2): qty 0.4

## 2021-08-18 NOTE — Progress Notes (Signed)
Vital signs reviewed, ICU needs resolved  Will sign off at this time. No further recommendations at this time.  Please call [phone] for further questions. Thank you.    Raeford Brandenburg David Edker Punt, M.D.  [facility]  Medical Director [facility] [place] Medical Director [facility] Cardio-Pulmonary Department   

## 2021-08-18 NOTE — Progress Notes (Signed)
Pharmacy Antibiotic Note  Kylie Saunders is a 60 y.o. female admitted on 08/16/2021 with infected right post mastectomy seroma. S/p irrigation and drainage. Drain in place. Patient with AKI on admission, creatinine now improving. Pharmacy has been consulted for vancomycin dosing.   Plan: Random vanc level 7 this morning Vancomycin 1250 mg IV today Based on renal function tomorrow morning, will likely start maintenance dose  Height: 4\' 11"  (149.9 cm) Weight: 84.4 kg (186 lb 1.1 oz) IBW/kg (Calculated) : 43.2  Temp (24hrs), Avg:97.8 F (36.6 C), Min:97 F (36.1 C), Max:98 F (36.7 C)  Recent Labs  Lab 08/16/21 1250 08/16/21 1351 08/16/21 1752 08/17/21 0528 08/17/21 1704 08/18/21 0524  WBC  --  7.1  --  7.1 6.7 6.8  CREATININE  --  2.53*  --  1.83* 1.84* 1.57*  LATICACIDVEN 0.9  --  1.0  --   --   --   VANCORANDOM  --   --   --   --   --  7     Estimated Creatinine Clearance: 35 mL/min (A) (by C-G formula based on SCr of 1.57 mg/dL (H)).    No Known Allergies  Antimicrobials this admission: Cefazolin 9/28 >> 9/29 Ceftriaxone 9/29 >> Vancomycin 9/27 >>   Microbiology results: 9/28 BCx: no growth 9/28 Wound (right chest): pending    Thank you for allowing pharmacy to be a part of this patient's care.   Tawnya Crook, PharmD, BCPS Clinical Pharmacist 08/18/2021 1:51 PM

## 2021-08-18 NOTE — Telephone Encounter (Signed)
Please schedule PET and notify pt/ POA of appt.

## 2021-08-18 NOTE — Progress Notes (Signed)
[place] Hospital Day(s): 1.   Post op day(s): 1 Day Post-Op.   Interval History:  Patient seen and examined No acute events or new complaints overnight.  Patient reports she is doing well, no complaints of pain No fever She remains without leukocytosis this morning; WBC 6.8 Hgb stable at 9.8 Renal function improving; sCr - 1.57; UO - 550 ccs + unmeasured No significant electrolyte derangements Cx from OR on 09/28 growing GPC in clusters Surgical drain with 115 ccs out; serosanguinous  Continues on Rocephin On a regular diet  Vital signs in last 24 hours: [min-max] current  Temp:  [97 F (36.1 C)-101.4 F (38.6 C)] 98 F (36.7 C) (09/29 0715) Pulse Rate:  [37-96] 69 (09/29 0800) Resp:  [0-22] 14 (09/29 0800) BP: (69-139)/(41-81) 115/75 (09/29 0800) SpO2:  [81 %-100 %] 96 % (09/29 0800) FiO2 (%):  [35 %] 35 % (09/28 1900) Weight:  [79.8 kg-84.4 kg] 84.4 kg (09/28 1800)     Height: 4\' 11"  (149.9 cm) Weight: 84.4 kg BMI (Calculated): 37.56   Intake/Output last 2 shifts:  09/28 0701 - 09/29 0700 In: 1072.3 [I.V.:782.1; IV Piggyback:290.3] Out: 665 [Urine:550; Drains:115]   Physical Exam:  Constitutional: alert, cooperative and no distress  Respiratory: breathing non-labored at rest  Cardiovascular: regular rate and sinus rhythm  Integumentary: Right mastectomy wound is CDI with steri-strips and dressing, no significant erythema this morning, drain in right lateral aspect of wound with serosanguinous output   Labs:  CBC Latest Ref Rng & Units 08/18/2021 08/17/2021 08/17/2021  WBC 4.0 - 10.5 K/uL 6.8 6.7 7.1  Hemoglobin 12.0 - 15.0 g/dL 9.8(L) 9.7(L) 9.9(L)  Hematocrit 36.0 - 46.0 % 30.5(L) 31.4(L) 30.4(L)  Platelets 150 - 400 K/uL 230 251 228   CMP Latest Ref Rng & Units 08/18/2021 08/17/2021 08/17/2021  Glucose 70 - 99 mg/dL 142(H) 111(H) 119(H)  BUN 8 - 23 mg/dL 33(H) 33(H) 36(H)  Creatinine 0.44 - 1.00 mg/dL 1.57(H) 1.84(H)  1.83(H)  Sodium 135 - 145 mmol/L 138 134(L) 134(L)  Potassium 3.5 - 5.1 mmol/L 4.2 4.5 3.9  Chloride 98 - 111 mmol/L 99 102 98  CO2 22 - 32 mmol/L 28 21(L) 26  Calcium 8.9 - 10.3 mg/dL 8.5(L) 8.0(L) 8.0(L)  Total Protein 6.5 - 8.1 g/dL - - -  Total Bilirubin 0.3 - 1.2 mg/dL - - -  Alkaline Phos 38 - 126 U/L - - -  AST 15 - 41 U/L - - -  ALT 0 - 44 U/L - - -     Imaging studies: No new pertinent imaging studies   Assessment/Plan:  60 y.o. female 1 Day Post-Op s/p incision and drainage of infected right post-mastectomy seroma.   - Okay to continue regular diet from surgical perspective    - Continue IV Abx (Rocephin); follow up Cx; growing GPC in clusters    - Pain control prn - Monitor surgical drain; monitor and record output - Monitor renal function; improving  - Wound care: Cover with dry gauze and secure with tape; complete daily - Further management per primary service   All of the above findings and recommendations were discussed with the patient, and the medical team.  -- Edison Simon, PA-C [facility] 08/18/2021, 8:56 AM [phone] M-F: 7am - 4pm

## 2021-08-18 NOTE — Assessment & Plan Note (Signed)
S/p irrigation and drainage and replacement of closed drainage system by Dr. Christian Mate on 9/28  Sepsis ruled out.  Did not meet SIRS, and likely hypotension and tachycardia due to dehydration.  - Continue Rocephin and vancomycin - Follow-up intraoperative cultures

## 2021-08-18 NOTE — Progress Notes (Signed)
  Progress Note    Kylie Saunders   HUT:[id number]  DOB: 1961-11-20  DOA: 08/16/2021     1 Date of Service: 08/18/2021     Subjective:  Feeling improvement today.  No significant chest pain.  No fever.  No confusion.  No dyspnea or swelling.  Hospital Problems Cellulitis, wound, post-operative S/p irrigation and drainage and replacement of closed drainage system by Dr. Christian Mate on 9/28  Sepsis ruled out.  Did not meet SIRS, and likely hypotension and tachycardia due to dehydration.  - Continue Rocephin and vancomycin - Follow-up intraoperative cultures  Acute respiratory failure with hypoxia and hypercapnia (HCC) Postop yesterday the patient was lethargic and hypoxic.  ABG showed pH 7.26, PCO2 53, normal bicarb.  She was placed on BiPAP, her respiratory acidosis resolved.  Likely there is some component of undiagnosed OHS/OSA - CPAP at night - Outpatient sleep study recommended  Acute metabolic encephalopathy This was due to hypercapnia, now resolved  AKI (acute kidney injury) ([place]) Creatinine greater than 2 on admission, down to 1.8 yesterday, 1.5 today.  Metabolic acidosis ruled out - Continue IV fluids - Hold lisinopril and HCTZ - Hold nephrotoxins    Normocytic anemia Hemoglobin stable relative to baseline  Essential hypertension Amlodipine held due to hypotension yesterday  Hypokalemia Hypokalemia resolved  Schizophrenia, paranoid (HCC) - Continue Risperdal and Cogentin  HLD (hyperlipidemia) - Continue statin -Continue low-dose aspirin  Hyponatremia Resolved with fluids  Malignant neoplasm of overlapping sites of right breast in female, estrogen receptor positive ([place])    Intellectual disability       Objective Vital signs were reviewed and unremarkable.  Vitals:   08/18/21 1200 08/18/21 1400 08/18/21 1600 08/18/21 1700  BP: 138/89 108/65 115/72   Pulse: 92 65 73 64  Resp: (!) 24 10 14 12   Temp: 98 F (36.7 C)     TempSrc: Oral     SpO2: 97%  99% 97% 98%  Weight:      Height:       84.4 kg  Exam General appearance: Obese female, lying in bed, no acute distress     HEENT: Right eye deformed, left eye normal, without conjunctivitis, normal lids and lashes, no nasal forming, discharge, or epistaxis, lips dry, dentition intact, oropharynx tacky dry, and no oral lesions Skin: No suspicious rashes or lesions Cardiac: Normal rate and rhythm, no murmurs, rubs, or gallops, puffy nonpitting edema Respiratory: Normal respiratory rate and rhythm, lungs clear without rales or wheezes, lung sounds diminished Abdomen: Abdomen soft without tenderness palpation or guarding, no ascites or distention MSK: The right chest is covered with a dressing, there is some serous drainage on her gown, otherwise normal Muscle bulk and tone Neuro: Awake and alert, face symmetric, speech fluent, upper extremity strength generalized weakness but symmetric strength Psych: Attentive to conversation, responds to questions, oriented to hospital, situation, cognition appears impaired   Labs / Other Information My review of labs, imaging, notes and other tests is significant for Creatinine down to 1.5, ABG normalized overnight, bicarb and potassium normal this morning     Time spent: 25 minutes [facility] 08/18/2021, 5:22 PM

## 2021-08-18 NOTE — Assessment & Plan Note (Signed)
Postop yesterday the patient was lethargic and hypoxic.  ABG showed pH 7.26, PCO2 53, normal bicarb.  She was placed on BiPAP, her respiratory acidosis resolved.  Likely there is some component of undiagnosed OHS/OSA - CPAP at night - Outpatient sleep study recommended

## 2021-08-18 NOTE — TOC Initial Note (Signed)
Transition of Care [facility]) - Initial/Assessment Note    Patient Details  Name: Kylie Saunders MRN: [id number] Date of Birth: February 18, 1961  [facility] CM/SW Contact:    Kerin Salen, RN Phone Number: 08/18/2021, 8:49 AM  Clinical Narrative:  Spoke with Cam Hai, [place]. Where patient resides. Ms. Stann Mainland says patient has lived at group home for 80yrs and no problem returning when medically stable.                       Patient Goals and CMS Choice        Expected Discharge Plan and Services                                                Prior Living Arrangements/Services                       Activities of Daily Living Home Assistive Devices/Equipment: None ADL Screening (condition at time of admission) Patient's cognitive ability adequate to safely complete daily activities?: No Is the patient deaf or have difficulty hearing?: No Does the patient have difficulty seeing, even when wearing glasses/contacts?: Yes Does the patient have difficulty concentrating, remembering, or making decisions?: Yes Patient able to express need for assistance with ADLs?: Yes Does the patient have difficulty dressing or bathing?: No Independently performs ADLs?: Yes (appropriate for developmental age) Communication: Independent Dressing (OT): Independent Grooming: Independent Feeding: Independent Bathing: Independent Toileting: Independent In/Out Bed: Independent Walks in Home: Independent Does the patient have difficulty walking or climbing stairs?: No Weakness of Legs: Right Weakness of Arms/Hands: None  Permission Sought/Granted                  Emotional Assessment              Admission diagnosis:  AKI (acute kidney injury) ([place]) [N17.9] History of modified radical mastectomy of right breast [Z90.11] Patient Active Problem List   Diagnosis Date Noted   Superficial postoperative wound  infection 08/17/2021   Hypokalemia 08/17/2021   Essential hypertension 08/17/2021   AKI (acute kidney injury) ([place]) 08/16/2021   Malignant neoplasm of right female breast ([place]) 08/14/2021   PNA (pneumonia) 07/28/2021   Sepsis ([place]) 07/28/2021   Aspiration pneumonia ([place]) 07/28/2021   Septic shock ([place]) 07/28/2021   Malignant neoplasm of overlapping sites of right breast in female, estrogen receptor positive ([place]) 76/54/6503   Biliary colic 54/65/6812   Ruptured globe of right eye 12/22/2016   Left upper quadrant pain    Acute pancreatitis    Abdominal pain 12/16/2016   Schizophrenia, paranoid ([place]) 11/03/2016   Right eye injury 11/03/2016   HLD (hyperlipidemia) 07/25/2012   Intellectual disability 07/25/2012   Cancer of body of uterus ([place]) 05/31/2012   Malignant neoplasm of body of uterus ([place]) 05/31/2012   PCP:  Novella Rob, FNP Pharmacy:   [facility] [place] [address] Phone: [phone] Fax: [phone]     Social Determinants of Health (SDOH) Interventions    Readmission Risk Interventions No flowsheet data found.

## 2021-08-18 NOTE — Assessment & Plan Note (Signed)
Hypokalemia resolved

## 2021-08-18 NOTE — Assessment & Plan Note (Signed)
-   Continue Risperdal and Cogentin

## 2021-08-18 NOTE — Assessment & Plan Note (Signed)
This was due to hypercapnia, now resolved

## 2021-08-18 NOTE — Assessment & Plan Note (Signed)
-   Continue statin -Continue low-dose aspirin

## 2021-08-18 NOTE — Assessment & Plan Note (Signed)
Resolved with fluids °

## 2021-08-18 NOTE — Telephone Encounter (Signed)
Oral Oncology Patient Advocate Encounter   Received notification from [facility] that prior authorization for Verzenio is required.   PA submitted on CoverMyMeds Key [id number]  Status is pending   Oral Oncology Clinic will continue to follow.  [place] Patient [place] Phone [phone] Fax [phone] 08/19/2021 3:24 PM

## 2021-08-18 NOTE — Telephone Encounter (Signed)
-----   Message from Earlie Server, MD sent at 08/17/2021  4:50 PM EDT ----- I talked to patient's POA. Family declines additional surgery RT and chemo. Plan to do AI + abemaciclib. POA agrees. Also needs PET Benjamine Mola, please arrange her to have PET scan, if not approved then CT c/a/p. - breast cancer Lymph node positive.   Alyson and Bethena Roys, please look into abemaciclib coverage. 150mg  BID (Ki 67% is 16%, not sure if insurance will cover or not, but DFS benefit was seen in all cohort in the [place] study)

## 2021-08-18 NOTE — Progress Notes (Signed)
GOALS OF CARE DISCUSSION  The Clinical status was relayed to family in detail. Brother Rolinda Roan Updated and notified of patients medical condition.    Explained to family course of therapy and the modalities    Patient with Progressive multiorgan failure with a very high probablity of a very minimal chance of meaningful recovery despite all aggressive and optimal medical therapy.    Family understands the situation. They have consented and agreed to DNR/DNI   Family are satisfied with Plan of action and management. All questions answered   Corrin Parker, M.D.  [facility]  Medical Director [place] Director [facility] Cardio-Pulmonary Department

## 2021-08-18 NOTE — Assessment & Plan Note (Signed)
Hemoglobin stable relative to baseline 

## 2021-08-18 NOTE — Assessment & Plan Note (Signed)
Creatinine greater than 2 on admission, down to 1.8 yesterday, 1.5 today.  Metabolic acidosis ruled out - Continue IV fluids - Hold lisinopril and HCTZ - Hold nephrotoxins

## 2021-08-18 NOTE — Assessment & Plan Note (Signed)
Amlodipine held due to hypotension yesterday

## 2021-08-19 ENCOUNTER — Other Ambulatory Visit (HOSPITAL_COMMUNITY): Payer: Self-pay

## 2021-08-19 ENCOUNTER — Telehealth: Payer: Self-pay | Admitting: Pharmacist

## 2021-08-19 LAB — BASIC METABOLIC PANEL
Anion gap: 5 (ref 5–15)
BUN: 29 mg/dL — ABNORMAL HIGH (ref 8–23)
CO2: 30 mmol/L (ref 22–32)
Calcium: 7.9 mg/dL — ABNORMAL LOW (ref 8.9–10.3)
Chloride: 104 mmol/L (ref 98–111)
Creatinine, Ser: 1.06 mg/dL — ABNORMAL HIGH (ref 0.44–1.00)
GFR, Estimated: 59 mL/min — ABNORMAL LOW (ref >60)
Glucose, Bld: 123 mg/dL — ABNORMAL HIGH (ref 70–99)
Potassium: 3.7 mmol/L (ref 3.5–5.1)
Sodium: 139 mmol/L (ref 135–145)

## 2021-08-19 LAB — CBC
HCT: 29.6 % — ABNORMAL LOW (ref 36.0–46.0)
Hemoglobin: 9.4 g/dL — ABNORMAL LOW (ref 12.0–15.0)
MCH: 28.8 pg (ref 26.0–34.0)
MCHC: 31.8 g/dL (ref 30.0–36.0)
MCV: 90.8 fL (ref 80.0–100.0)
Platelets: 252 10*3/uL (ref 150–400)
RBC: 3.26 MIL/uL — ABNORMAL LOW (ref 3.87–5.11)
RDW: 15 % (ref 11.5–15.5)
WBC: 10 10*3/uL (ref 4.0–10.5)
nRBC: 0 % (ref 0.0–0.2)

## 2021-08-19 LAB — GLUCOSE, CAPILLARY: Glucose-Capillary: 136 mg/dL — ABNORMAL HIGH (ref 70–99)

## 2021-08-19 LAB — PROCALCITONIN: Procalcitonin: 0.74 ng/mL

## 2021-08-19 MED ORDER — VANCOMYCIN HCL 750 MG/150ML IV SOLN
750.0000 mg | INTRAVENOUS | Status: DC
  Administered 2021-08-19 – 2021-08-20 (×2): 750 mg via INTRAVENOUS
  Filled 2021-08-19 (×3): qty 150

## 2021-08-19 NOTE — Progress Notes (Signed)
Progress Note    Kylie Saunders   QDU:[id number]  DOB: 08/23/1961  DOA: 08/16/2021     2 Date of Service: 08/19/2021    Brief summary: Kylie Saunders is a 60 y.o. 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  In the ER, the site appeared red.  BP 96 systolic, HR 375, RR 21.   9/27: Admitted and started on fluids, antibiotics 9/28: General surgery consulted, Dr. Christian Mate took to the OR for I&D and washout, cultures sent.  Post-op, patient lethargic, ABG showed respiratory acidosis, mentation improved after few hours BiPAP 9/29-9/30: Transferred OOU, weaned to 2L, feeling better, cultures pending      Subjective:  Improvement noted in drainage and also noted to have no fever. No chest pain, no confusion, no dyspnea. No respiratory distress.  Appetite good.  Hospital Problems Cellulitis, wound, post-operative S/p irrigation and drainage and replacement of closed drainage system by Dr. Christian Mate on 9/28  Sepsis ruled out.  Did not meet SIRS, and likely hypotension and tachycardia due to dehydration.  No fever - Continue Rocephin and vancomycin - Follow-up intraoperative cultures  Acute respiratory failure with hypoxia and hypercapnia (HCC) Postop  the patient was lethargic and hypoxic.  ABG showed pH 7.26, PCO2 53, normal bicarb.  She was placed on BiPAP, her respiratory acidosis resolved.  Likely there is some component of undiagnosed OHS/OSA - CPAP at night - Outpatient sleep study recommended  Acute metabolic encephalopathy This was due to hypercapnia, now resolved  AKI (acute kidney injury) ([place]) Creatinine greater than 2 on admission then 1.8 > 1.5 > 1.0 today.  Resolved, metabolic acidosis ruled out - Stop IV fluids - Hold lisinopril and HCTZ - Hold nephrotoxins    Normocytic anemia Hemoglobin stable relative to baseline  Essential  hypertension BP normal -Hold amlodipine, lisinopril, HCTZ  Hypokalemia Hypokalemia resolved  Schizophrenia, paranoid (HCC) - Continue Risperdal and Cogentin  HLD (hyperlipidemia) - Continue statin - Continue low-dose aspirin  Hyponatremia Resolved with fluids  Malignant neoplasm of overlapping sites of right breast in female, estrogen receptor positive (HCC) S/P mastectomy   Intellectual disability - Continue Risperdal and Cogentin     Objective Vital signs were reviewed and unremarkable.  Vitals:   08/18/21 2119 08/19/21 0525 08/19/21 0819 08/19/21 1146  BP: 138/77 129/63 (!) 98/54 100/61  Pulse: 72 64 65 70  Resp: _0 Temp: 98.5 F (36.9 C) 97.9 F (36.6 C) 98.3 F (36.8 C) 97.9 F (36.6 C)  TempSrc:      SpO2: 99% 98% 98% 99%  Weight:      Height:       84.4 kg  Exam General appearance: Obese adult female, lying in bed, no acute distress     HEENT: Right eye deformed, left eye normal.  Lids and lashes normal, no nasal forming, discharge, epistaxis. Skin: Resolving redness around her mastectomy site. Cardiac: RRR, no murmurs, no lower extremity edema Respiratory: Normal respiratory rate and rhythm, lung sounds normal, no rales or wheezes Abdomen: Abdomen soft no tenderness palpation or guarding, no ascites or distention MSK:  Neuro: Awake and alert, face symmetric, speech fluent, upper extremity strength seems normal with normal coordination Psych: Attention normal, affect appropriate, judgment insight appear cognitively impaired at baseline    Labs / Other Information My review of labs, imaging, notes and other tests is significant for Creatinine returned  to normal, hemoglobin stable in the nines, potassium and sodium normalized.   Call to brother/POA, no answer, left voicemail.  Time spent: 25 minutes [facility] 08/19/2021, 2:48 PM

## 2021-08-19 NOTE — Assessment & Plan Note (Signed)
S/P mastectomy

## 2021-08-19 NOTE — Assessment & Plan Note (Signed)
Hypokalemia resolved

## 2021-08-19 NOTE — Assessment & Plan Note (Signed)
-   Continue statin - Continue low-dose aspirin

## 2021-08-19 NOTE — Assessment & Plan Note (Signed)
-   Continue Risperdal and Cogentin

## 2021-08-19 NOTE — Assessment & Plan Note (Signed)
S/p irrigation and drainage and replacement of closed drainage system by Dr. Christian Mate on 9/28  Sepsis ruled out.  Did not meet SIRS, and likely hypotension and tachycardia due to dehydration.  No fever - Continue Rocephin and vancomycin - Follow-up intraoperative cultures

## 2021-08-19 NOTE — Care Management Important Message (Signed)
Important Message  Patient Details  Name: Kylie Saunders MRN: [id number] Date of Birth: [birth year]   Medicare Important Message Given:  N/A - LOS <3 / Initial given by admissions     Dannette Barbara 08/19/2021, 9:23 AM

## 2021-08-19 NOTE — Assessment & Plan Note (Signed)
Resolved with fluids °

## 2021-08-19 NOTE — Telephone Encounter (Signed)
Oral Oncology Patient Advocate Encounter  Prior Authorization for Kylie Saunders has been approved.    PA# [id number] Effective dates: 08/19/21 through 11/19/21  Patients co-pay is $2975.70.  Oral Oncology Clinic will continue to follow.   [place] Patient [place] Phone [phone] Fax [phone] 08/19/2021 4:06 PM

## 2021-08-19 NOTE — Telephone Encounter (Signed)
Oral Oncology Pharmacist Encounter  Received new prescription for Verzenio (abemaciclib) for the adjuvant treatment of stage IB breast cancer, ER/PR positive, HER2 negative in conjunction with an aromatase inhibitor, planned duration of 2 years.   Patient has POA and family declines additional surgery RT and chemo. POA agreed to adjuvant AI + abemaciclib  CBC from 08/19/21 and CMP from 08/16/21 assessed, no relevant lab abnormalities. Prescription dose and frequency assessed.   Current medication list in Epic reviewed, no DDIs with abemaciclib identified.  Evaluated chart and one patient barriers to medication adherence identified. Patient live in a group home.  Prescription has been e-scribed to the [facility] for benefits analysis and approval.  Oral Oncology Clinic will continue to follow for insurance authorization, copayment issues, initial counseling and start date.   Darl Pikes, PharmD, BCPS, BCOP, CPP Hematology/Oncology Clinical Pharmacist Practitioner [facility]/HP/AP [facility] [phone]  08/19/2021 2:43 PM

## 2021-08-19 NOTE — Assessment & Plan Note (Signed)
Hemoglobin stable relative to baseline 

## 2021-08-19 NOTE — Assessment & Plan Note (Signed)
This was due to hypercapnia, now resolved

## 2021-08-19 NOTE — Progress Notes (Addendum)
Pharmacy Antibiotic Note  Kylie Saunders is a 60 y.o. female admitted on 08/16/2021 with infected right post mastectomy seroma. S/p irrigation and drainage. Drain in place. Patient with AKI on admission, creatinine now improving(2.53-->1.06, b/l 0.8s). Pharmacy has been consulted for vancomycin dosing. Patient also receiving Rocephin 1g Q24 hours.  Antibiotics Day 4   Plan: Patient's renal function has improved to near baseline. Will proceed with AUC dosing.  Vancomycin 750 mg IV Q 24 hrs. Goal AUC 400-550. Expected AUC: 500 Expected Css: 13.7 SCr used: 1.06, Vd used: 0.5   Height: 4\' 11"  (149.9 cm) Weight: 84.4 kg (186 lb 1.1 oz) IBW/kg (Calculated) : 43.2  Temp (24hrs), Avg:98.2 F (36.8 C), Min:97.9 F (36.6 C), Max:98.5 F (36.9 C)  Recent Labs  Lab 08/16/21 1250 08/16/21 1351 08/16/21 1752 08/17/21 0528 08/17/21 1704 08/18/21 0524 08/19/21 0640  WBC  --  7.1  --  7.1 6.7 6.8 10.0  CREATININE  --  2.53*  --  1.83* 1.84* 1.57* 1.06*  LATICACIDVEN 0.9  --  1.0  --   --   --   --   VANCORANDOM  --   --   --   --   --  7  --      Estimated Creatinine Clearance: 51.9 mL/min (A) (by C-G formula based on SCr of 1.06 mg/dL (H)).    No Known Allergies  Antimicrobials this admission: Cefazolin 9/28 >> 9/29 Ceftriaxone 9/29 >> Vancomycin 9/27 >>   Microbiology results: 9/28 BCx: no growth 9/28 Wound (right chest): abundant GPC in clusters w/ WBC present    Thank you for allowing pharmacy to be a part of this patient's care.   Pearla Dubonnet, PharmD Clinical Pharmacist 08/19/2021 2:26 PM

## 2021-08-19 NOTE — Assessment & Plan Note (Signed)
Creatinine greater than 2 on admission then 1.8 > 1.5 > 1.0 today.  Resolved, metabolic acidosis ruled out - Stop IV fluids - Hold lisinopril and HCTZ - Hold nephrotoxins

## 2021-08-19 NOTE — Assessment & Plan Note (Signed)
BP normal -Hold amlodipine, lisinopril, HCTZ

## 2021-08-19 NOTE — TOC Progression Note (Signed)
Transition of Care [facility]) - Progression Note    Patient Details  Name: Kylie Saunders MRN: [id number] Date of Birth: 07-21-1961  Transition [facility]) CM/SW Contact  Shelbie Hutching, RN Phone Number: 08/19/2021, 2:13 PM  Clinical Narrative:    MD anticipates that patient will be medically ready for discharge tomorrow.  Group home owner Dorthea updated that patient may be able to return tomorrow.  Group home can provide transportation as long as the weather isn't bad, and she would like to know early what time patient would be ready for pick up.     Expected Discharge Plan: Group Home Barriers to Discharge: Continued Medical Work up  Expected Discharge Plan and Services Expected Discharge Plan: Group Home   Discharge Planning Services: CM Consult   Living arrangements for the past 2 months: Group Home                 DME Arranged: N/A DME Agency: NA       HH Arranged: NA           Social Determinants of Health (SDOH) Interventions    Readmission Risk Interventions No flowsheet data found.

## 2021-08-19 NOTE — Assessment & Plan Note (Signed)
Postop  the patient was lethargic and hypoxic.  ABG showed pH 7.26, PCO2 53, normal bicarb.  She was placed on BiPAP, her respiratory acidosis resolved.  Likely there is some component of undiagnosed OHS/OSA - CPAP at night - Outpatient sleep study recommended

## 2021-08-19 NOTE — Progress Notes (Signed)
[place] Hospital Day(s): 2.   Post op day(s): 2 Days Post-Op.   Interval History:  Patient seen and examined No acute events or new complaints overnight.  Patient reports she is doing well, no complaints of pain No fever She remains without leukocytosis this morning; WBC 10.0 Hgb stable at 9.4 Renal function improving; sCr - 1.06; UO - 1250 ccs + unmeasured No significant electrolyte derangements Cx from OR on 09/28 growing GPC in clusters Surgical drain with 115 ccs out; serosanguinous  Continues on Rocephin On a regular diet  Vital signs in last 24 hours: [min-max] current  Temp:  [97.9 F (36.6 C)-98.5 F (36.9 C)] 97.9 F (36.6 C) (09/30 1146) Pulse Rate:  [64-73] 70 (09/30 1146) Resp:  [10-18] 18 (09/30 1146) BP: (98-141)/(54-86) 100/61 (09/30 1146) SpO2:  [97 %-99 %] 99 % (09/30 1146)     Height: 4\' 11"  (149.9 cm) Weight: 84.4 kg BMI (Calculated): 37.56   Intake/Output last 2 shifts:  09/29 0701 - 09/30 0700 In: 2260.9 [P.O.:320; I.V.:1591.1; IV Piggyback:349.9] Out: 1330 [Urine:1250; Drains:80]   Physical Exam:  Constitutional: alert, cooperative and no distress  Respiratory: breathing non-labored at rest  Cardiovascular: regular rate and sinus rhythm  Integumentary: Right mastectomy wound is CDI with steri-strips and dressing, no significant erythema this morning, drain in right lateral aspect of wound with serosanguinous output   Labs:  CBC Latest Ref Rng & Units 08/19/2021 08/18/2021 08/17/2021  WBC 4.0 - 10.5 K/uL 10.0 6.8 6.7  Hemoglobin 12.0 - 15.0 g/dL 9.4(L) 9.8(L) 9.7(L)  Hematocrit 36.0 - 46.0 % 29.6(L) 30.5(L) 31.4(L)  Platelets 150 - 400 K/uL 252 230 251   CMP Latest Ref Rng & Units 08/19/2021 08/18/2021 08/17/2021  Glucose 70 - 99 mg/dL 123(H) 142(H) 111(H)  BUN 8 - 23 mg/dL 29(H) 33(H) 33(H)  Creatinine 0.44 - 1.00 mg/dL 1.06(H) 1.57(H) 1.84(H)  Sodium 135 - 145 mmol/L 139 138 134(L)  Potassium 3.5 - 5.1  mmol/L 3.7 4.2 4.5  Chloride 98 - 111 mmol/L 104 99 102  CO2 22 - 32 mmol/L 30 28 21(L)  Calcium 8.9 - 10.3 mg/dL 7.9(L) 8.5(L) 8.0(L)  Total Protein 6.5 - 8.1 g/dL - - -  Total Bilirubin 0.3 - 1.2 mg/dL - - -  Alkaline Phos 38 - 126 U/L - - -  AST 15 - 41 U/L - - -  ALT 0 - 44 U/L - - -     Imaging studies: No new pertinent imaging studies   Assessment/Plan:  60 y.o. female 2 Days Post-Op s/p incision and drainage of infected right post-mastectomy seroma.   - Okay to continue regular diet from surgical perspective    - Continue IV Abx (Rocephin); follow up Cx; growing GPC in clusters    - Pain control prn - Monitor surgical drain; monitor and record output - Monitor renal function; improving  - Wound care: Cover with dry gauze and secure with tape; complete daily - Further management per primary service   - Nothing further from surgical perspective, Abx for home, continue drain, follow up as scheduled with Dr Christian Mate on 10/04  All of the above findings and recommendations were discussed with the patient, and the medical team.  -- Edison Simon, PA-C [facility] 08/19/2021, 12:06 PM [phone] M-F: 7am - 4pm

## 2021-08-20 LAB — BASIC METABOLIC PANEL
Anion gap: 7 (ref 5–15)
BUN: 25 mg/dL — ABNORMAL HIGH (ref 8–23)
CO2: 29 mmol/L (ref 22–32)
Calcium: 7.9 mg/dL — ABNORMAL LOW (ref 8.9–10.3)
Chloride: 104 mmol/L (ref 98–111)
Creatinine, Ser: 0.96 mg/dL (ref 0.44–1.00)
GFR, Estimated: >60 mL/min (ref >60)
Glucose, Bld: 126 mg/dL — ABNORMAL HIGH (ref 70–99)
Potassium: 3.3 mmol/L — ABNORMAL LOW (ref 3.5–5.1)
Sodium: 140 mmol/L (ref 135–145)

## 2021-08-20 MED ORDER — SULFAMETHOXAZOLE-TRIMETHOPRIM 800-160 MG PO TABS: 1.0000 | ORAL_TABLET | Freq: Two times a day (BID) | ORAL | 0 refills | Status: DC

## 2021-08-20 MED ORDER — CEPHALEXIN 500 MG PO CAPS: 500.0000 mg | ORAL_CAPSULE | Freq: Three times a day (TID) | ORAL | 0 refills | Status: DC

## 2021-08-20 MED ORDER — POTASSIUM CHLORIDE CRYS ER 20 MEQ PO TBCR
40.0000 meq | EXTENDED_RELEASE_TABLET | Freq: Two times a day (BID) | ORAL | Status: DC
  Administered 2021-08-20: 40 meq via ORAL
  Filled 2021-08-20: qty 2

## 2021-08-20 NOTE — Progress Notes (Signed)
Call and gave report to [facility], [place], reviewed all meds, prescriptions and follow up appointments with patient and Skeet Simmer and both  verbalized understanding

## 2021-08-20 NOTE — NC FL2 (Addendum)
[place] LEVEL OF CARE SCREENING TOOL     IDENTIFICATION  Patient Name: Kylie Saunders Birthdate: 1961/04/16 Sex: female Admission Date (Current Location): 08/16/2021  [place] and [place] Number:  Engineering geologist and Address:  [facility], [address] [place], [place] [address]      Provider Number: [id number]  Attending Physician Name and Address:  Edwin Dada, *  Relative Name and Phone Number:  Rogers,Dorothea Other     [phone]    Current Level of Care: Hospital Recommended Level of Care: Other (Comment) (Group Home) Prior Approval Number:    Date Approved/Denied:   PASRR Number:    Discharge Plan: Other (Comment) (Group Home)    Current Diagnoses: Patient Active Problem List   Diagnosis Date Noted   Cellulitis, wound, post-operative 08/17/2021   Acute respiratory failure with hypoxia and hypercapnia ([place]) 93/23/5573   Acute metabolic encephalopathy 22/12/5425   AKI (acute kidney injury) ([place]) 08/16/2021   Normocytic anemia 08/18/2021   Hypokalemia 08/17/2021   Essential hypertension 08/17/2021   Schizophrenia, paranoid ([place]) 11/03/2016   HLD (hyperlipidemia) 07/25/2012   Hyponatremia 08/18/2021   Malignant neoplasm of overlapping sites of right breast in female, estrogen receptor positive ([place]) 07/18/2021   Intellectual disability 07/25/2012   Malignant neoplasm of right female breast ([place]) 08/14/2021   PNA (pneumonia) 07/28/2021   Sepsis ([place]) 07/28/2021   Aspiration pneumonia ([place]) 07/28/2021   Septic shock ([place]) 05/13/7627   Biliary colic 31/51/7616   Ruptured globe of right eye 12/22/2016   Left upper quadrant pain    Acute pancreatitis    Abdominal pain 12/16/2016   Right eye injury 11/03/2016   Cancer of body of uterus ([place]) 05/31/2012   Malignant neoplasm of body of uterus ([place]) 05/31/2012    Orientation RESPIRATION BLADDER Height & Weight     Self, Time, Situation, Place  Normal  (93% on room air) Incontinent Weight: 84.4 kg Height:  4\' 11"  (149.9 cm)  BEHAVIORAL SYMPTOMS/MOOD NEUROLOGICAL BOWEL NUTRITION STATUS        Diet (RegularRegular;Thin liquid Liquid Admin: Straw  Medication Administration: Whole meds with liquid  Patient able to self feed;Intermittent supervision to cue for compensatory strategies  Compensations: Slow;Small sips/bites)  AMBULATORY STATUS COMMUNICATION OF NEEDS Skin   Limited Assist Verbally Other (Comment), Surgical wounds (Scab or Fever blister on lower lip, Surgical incision R breast liquid adhesive)                       Personal Care Assistance Level of Assistance  Bathing, Feeding, Dressing Bathing Assistance: Limited assistance Feeding assistance: Independent Dressing Assistance: Limited assistance     Functional Limitations Info  Sight, Hearing, Speech Sight Info: 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) Hearing Info: Adequate Speech Info: Adequate    SPECIAL CARE FACTORS FREQUENCY   (JP Drain and dressing changes)                    Contractures Contractures Info: Not present    Additional Factors Info  Code Status, Allergies Code Status Info: DNR Allergies Info: NO Known Allergies           Current Medications (08/20/2021):  This is the current hospital active medication list Current Facility-Administered Medications  Medication Dose Route Frequency Provider Last Rate Last Admin   acetaminophen (TYLENOL) tablet 650 mg  650 mg Oral Q6H PRN Mansy, Jan A, MD   650 mg at 08/17/21 1009   Or   acetaminophen (  TYLENOL) suppository 650 mg  650 mg Rectal Q6H PRN Mansy, Jan A, MD       aspirin chewable tablet 81 mg  81 mg Oral Daily Mansy, Jan A, MD   81 mg at 08/20/21 0835   benztropine (COGENTIN) tablet 2 mg  2 mg Oral Daily Mansy, Jan A, MD   2 mg at 08/20/21 0834   cefTRIAXone (ROCEPHIN) 1 g in sodium chloride 0.9 % 100 mL IVPB  1 g Intravenous Q24H Edwin Dada, MD 200 mL/hr at 08/20/21 0841 1 g at 08/20/21 0841    Chlorhexidine Gluconate Cloth 2 % PADS 6 each  6 each Topical Daily Edwin Dada, MD   6 each at 08/19/21 0854   enoxaparin (LOVENOX) injection 40 mg  40 mg Subcutaneous Q24H Edwin Dada, MD   40 mg at 08/19/21 2137   loratadine (CLARITIN) tablet 10 mg  10 mg Oral Daily Mansy, Jan A, MD   10 mg at 08/20/21 6389   LORazepam (ATIVAN) tablet 0.5 mg  0.5 mg Oral BID Mansy, Jan A, MD   0.5 mg at 08/20/21 0835   ondansetron (ZOFRAN) tablet 4 mg  4 mg Oral Q6H PRN Mansy, Jan A, MD       Or   ondansetron [facility]) injection 4 mg  4 mg Intravenous Q6H PRN Mansy, Jan A, MD   4 mg at 08/17/21 1430   pantoprazole (PROTONIX) EC tablet 40 mg  40 mg Oral Daily Mansy, Jan A, MD   40 mg at 08/20/21 0835   polyethylene glycol (MIRALAX / GLYCOLAX) packet 17 g  17 g Oral Daily PRN Beers, Shanon Brow, RPH       potassium chloride SA (KLOR-CON) CR tablet 40 mEq  40 mEq Oral BID Edwin Dada, MD   40 mEq at 08/20/21 0926   risperiDONE (RISPERDAL) tablet 2 mg  2 mg Oral BID Mansy, Jan A, MD   2 mg at 08/20/21 0835   simvastatin (ZOCOR) tablet 40 mg  40 mg Oral Daily Mansy, Jan A, MD   40 mg at 08/20/21 0835   traZODone (DESYREL) tablet 25 mg  25 mg Oral QHS PRN Mansy, Jan A, MD       vancomycin (VANCOREADY) IVPB 750 mg/150 mL  750 mg Intravenous Q24H Rito Ehrlich A, RPH 150 mL/hr at 08/20/21 0928 750 mg at 08/20/21 3734     Discharge Medications: Please see discharge summary for a list of discharge medications.  Relevant Imaging Results:  Relevant Lab Results:   Additional Information SSN [id number]  Edwin Dada, MD

## 2021-08-20 NOTE — Discharge Summary (Addendum)
Physician Discharge Summary  Kylie Saunders QQP:[id number] DOB: Apr 02, 1961 DOA: 08/16/2021  PCP: Novella Rob, FNP  Admit date: 08/16/2021 Discharge date: 08/20/2021  Admitted From: Group home  Disposition:  Group home   Recommendations for Outpatient Follow-up:  Follow up with General Surgery Dr. Christian Mate on 10/4 as directed Follow up with Olen Pel PCP in 1 week Ms Kylie Saunders: Please obtain BMP in 1 week to check kidney function Ms Kylie Saunders: Please check BP and resume home BP medications as needed Ms Kylie Saunders: Please refer for outpatient sleep study      Home Health: RN due to developmental delay  Equipment/Devices: None new  Discharge Condition: Good  CODE STATUS: DNR Diet recommendation: Regular  Brief/Interim Summary: Ms Kylie Saunders is a 60 y.o. 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   In the ER, the site appeared red.  BP 96 systolic, HR 712, RR 21.     PRINCIPAL HOSPITAL DIAGNOSIS: Acute renal failure    Discharge Diagnoses:   AKI (acute kidney injury) ([place]) Creatinine baseline 0.9.  At admission, was 2.5.  Started on fluids, ACEI held, and creatinine improved to 1.8 > 1.5 > 1.0 > 0.9   Resolved, metabolic acidosis ruled out   Cellulitis, wound, post-operative due to MRSA S/p irrigation and drainage and replacement of closed drainage system by Dr. Christian Mate on 9/28  Sepsis ruled out.  Did not meet SIRS, and likely hypotension and tachycardia due to dehydration.   Admitted and treated with vancomycin and Rocephin.  Went to OR with Dr. Christian Mate day after admission. Intraoperative culture growing MRSA sensitive to Bactrim.    No further fever, wound redness resolved. Discharged to complete 10 days with Bactrim.     Acute respiratory failure with hypoxia and hypercapnia (HCC) Postoperatively, the patient was lethargic and  hypoxic.  ABG showed pH 7.26, PCO2 53, normal bicarb.  She was placed on BiPAP, her respiratory acidosis resolved.  Likely there is some component of undiagnosed OHS/OSA  Outpatient sleep study recommended     Acute metabolic encephalopathy At baseline, patient has developmental delay, but is interactive, pleasant, independent for self cares.  After her surgery, she was lethargic due to hypercapnia.  This resolved with non-invasive ventilation.       Normocytic anemia   Essential hypertension BP low due to dehydration.  At discharge, amlodipine restarted, but BP still low normal, and so lisinopril and HCTZ held.   Hypokalemia Treated and resolved.    Schizophrenia, paranoid ([place])   HLD (hyperlipidemia)   Hyponatremia Resolved with fluids   Malignant neoplasm of overlapping sites of right breast in female, estrogen receptor positive ([place]) S/P mastectomy    Intellectual disability         Discharge Instructions  Discharge Instructions     Discharge instructions   Complete by: As directed    From Dr. Loleta Books: You were admitted for weakness and found to have kidney injury and infection.  The infection was treated with IV antibiotics and washout in the operating room.  You should finish treatment with 7 more days of the antibiotic Bactrim Take Bactrim DS twice daily  For the wound, cover it with a simple bandage (in other words, cover with gauze, then secure with tape; change dressing daily or every other day).  These instructions should be the same as your previous instructions after the initial surgery.  Call Dr.  Rodenberg's office with questions Go to Dr. Forest Becker office on Oct 4 for your appointment   Also, your blood pressure is a little low You should restart your amlodipine but for now do NOT take lisinopril-HCTZ  Go see your primary care doctor in 1 week and discuss if you should restart this medicine   Discharge wound care:   Complete by: As  directed    Cover wound with gauze and secure with tape. Change daily or every other day (or as needed for soiling) Empty bulb when over half full. These instructions should be the same as after the initial surgery.   Increase activity slowly   Complete by: As directed       Allergies as of 08/20/2021   No Known Allergies      Medication List     STOP taking these medications    HYDROcodone-acetaminophen 5-325 MG tablet Commonly known as: NORCO/VICODIN   ibuprofen 800 MG tablet Commonly known as: ADVIL   lisinopril-hydrochlorothiazide 20-12.5 MG tablet Commonly known as: ZESTORETIC       TAKE these medications    amLODipine 5 MG tablet Commonly known as: NORVASC Take 5 mg by mouth daily.   aspirin 81 MG chewable tablet CHEW ONE TABLET BY MOUTH EVERY DAY   benztropine 2 MG tablet Commonly known as: COGENTIN Take 1 tablet (2 mg total) by mouth daily.   loratadine 10 MG tablet Commonly known as: CLARITIN Take 10 mg by mouth daily.   LORazepam 0.5 MG tablet Commonly known as: ATIVAN Take 1 tablet (0.5 mg total) by mouth 2 (two) times daily.   omeprazole 40 MG capsule Commonly known as: PRILOSEC Take 40 mg by mouth daily.   REFRESH OP Apply 1 application to eye 2 (two) times daily. Right eye only   risperiDONE 2 MG tablet Commonly known as: RisperDAL Take 1 tablet (2 mg total) by mouth 2 (two) times daily.   simvastatin 40 MG tablet Commonly known as: ZOCOR Take 40 mg by mouth daily.   sulfamethoxazole-trimethoprim 800-160 MG tablet Commonly known as: BACTRIM DS Take 1 tablet by mouth 2 (two) times daily for 7 days.               Discharge Care Instructions  (From admission, onward)           Start     Ordered   08/20/21 0000  Discharge wound care:       Comments: Cover wound with gauze and secure with tape. Change daily or every other day (or as needed for soiling) Empty bulb when over half full. These instructions should be the  same as after the initial surgery.   08/20/21 1246            Follow-up Information     Novella Rob, FNP. Schedule an appointment as soon as possible for a visit in 1 week(s).   Specialty: Family Medicine Contact information: [place] [address] [phone]         Ronny Bacon, MD. Go on 08/23/2021.   Specialty: General Surgery Contact information: [address] [address] [phone]                No Known Allergies  Consultations: General Surgery   Procedures/Studies: DG Chest 2 View  Result Date: 08/16/2021 CLINICAL DATA:  Fever, recent right mastectomy EXAM: CHEST - 2 VIEW COMPARISON:  07/28/2021 FINDINGS: Cardiomegaly. Trace pleural effusions. Disc degenerative disease of the thoracic  spine. IMPRESSION: Cardiomegaly with trace pleural effusions. Electronically Signed   By: Eddie Candle M.D.   On: 08/16/2021 13:53   CT Chest Wo Contrast  Result Date: 08/16/2021 CLINICAL DATA:  61 year old female with history of mastectomy last week. Clear drainage and redness around the incision site. EXAM: CT CHEST WITHOUT CONTRAST TECHNIQUE: Multidetector CT imaging of the chest was performed following the standard protocol without IV contrast. COMPARISON:  Chest CTA 07/28/2021. FINDINGS: Cardiovascular: Heart size is normal. There is no significant pericardial fluid, thickening or pericardial calcification. There is aortic atherosclerosis, as well as atherosclerosis of the great vessels of the mediastinum and the coronary arteries, including calcified atherosclerotic plaque in the left anterior descending, left circumflex and right coronary arteries. Mediastinum/Nodes: No pathologically enlarged mediastinal or hilar lymph nodes. Please note that accurate exclusion of hilar adenopathy is limited on noncontrast CT scans. Esophagus is unremarkable in appearance. No axillary lymphadenopathy. Lungs/Pleura: No acute  consolidative airspace disease. No pleural effusions. No suspicious appearing pulmonary nodules or masses are noted. Mild chronic scarring in the lung bases bilaterally. Upper Abdomen: Unremarkable. Musculoskeletal: Postoperative changes of recent right-sided modified radical mastectomy are noted. There is a large postoperative fluid collection in the mastectomy bed measuring approximately 11.4 x 5.2 x 13.9 cm (axial image 71 of series 2 and sagittal image 24 of series 6). Along the medial margin of this there is also some soft tissue gas (axial image 78 of series 2) in the subcutaneous fat of the medial aspect of the mastectomy site. Possible communication with the skin is noted on axial image 64 of series 2. There are no aggressive appearing lytic or blastic lesions noted in the visualized portions of the skeleton. IMPRESSION: 1. Postoperative changes of recent modified radical mastectomy with postoperative fluid and gas in the right chest wall, as detailed above. 2. Aortic atherosclerosis, in addition to 3 vessel coronary artery disease. Please note that although the presence of coronary artery calcium documents the presence of coronary artery disease, the severity of this disease and any potential stenosis cannot be assessed on this non-gated CT examination. Assessment for potential risk factor modification, dietary therapy or pharmacologic therapy may be warranted, if clinically indicated. Electronically Signed   By: Vinnie Langton M.D.   On: 08/16/2021 20:38   CT Angio Chest PE W and/or Wo Contrast  Result Date: 07/28/2021 CLINICAL DATA:  Found unresponsive and altered at group home EXAM: CT ANGIOGRAPHY CHEST WITH CONTRAST TECHNIQUE: Multidetector CT imaging of the chest was performed using the standard protocol during bolus administration of intravenous contrast. Multiplanar CT image reconstructions and MIPs were obtained to evaluate the vascular anatomy. CONTRAST:  88mL OMNIPAQUE IOHEXOL 350 MG/ML SOLN  COMPARISON:  Same day chest radiograph FINDINGS: Cardiovascular: Satisfactory opacification of the pulmonary arteries to the segmental level. No evidence of pulmonary embolism. The heart is mildly enlarged. There is no pericardial effusion. Mediastinum/Nodes: The thyroid is unremarkable. The esophagus is grossly unremarkable. There is no mediastinal, hilar, or axillary lymphadenopathy. Lungs/Pleura: The trachea and central airways are patent. There is extensive patchy nodular opacity throughout the right lung. The left lung is clear. There is no pleural effusion or pneumothorax. Upper Abdomen: The imaged portions of the upper abdominal viscera are unremarkable. Musculoskeletal: There is degenerative change in both shoulders and multilevel degenerative change throughout the thoracic spine. There is no acute osseous abnormality or aggressive osseous lesion. Other: The patient is status post right mastectomy. There is a surgical drain in place. There is scattered subcutaneous air.  There is no organized fluid collection. Review of the MIP images confirms the above findings. IMPRESSION: 1. No evidence of pulmonary embolism. 2. Extensive patchy nodular opacities throughout the right lung likely reflect multifocal infection/inflammation or aspiration. Given nodular appearance of many of the opacities, follow-up CT chest in 6-8 weeks is recommended to assess for resolution. 3. Mild cardiomegaly. 4. Status post right mastectomy without evidence of complication. Electronically Signed   By: Lesia Hausen M.D.   On: 07/28/2021 13:22   US Abdomen Complete  Result Date: 07/29/2021 CLINICAL DATA:  Abdominal pain. EXAM: ABDOMEN ULTRASOUND COMPLETE COMPARISON:  Prior MRI from 12/19/2016 FINDINGS: Gallbladder: Surgically absent. Common bile duct: Diameter: 8.5 mm Liver: Normal echogenicity without focal lesion or biliary dilatation. Portal vein is patent on color Doppler imaging with normal direction of blood flow towards the  liver. IVC: Normal caliber Pancreas: Sonographically unremarkable. Spleen: Normal size.  Normal echogenicity. Right Kidney: Length: 11.2 cm. Normal renal cortical thickness and echogenicity without focal lesions or hydronephrosis. Left Kidney: Length: 10.0 cm. Normal renal cortical thickness and echogenicity without focal lesions or hydronephrosis. Abdominal aorta: Normal caliber.  Atherosclerotic changes are noted. Other findings: No ascites. IMPRESSION: 1. Status post cholecystectomy.  No biliary dilatation. 2. Normal sonographic appearance of the liver, spleen, pancreas and both kidneys. Electronically Signed   By: Rudie Meyer M.D.   On: 07/29/2021 10:38   NM Sentinel Node Inj-No Rpt (Breast)  Result Date: 07/27/2021 Sulfur Colloid was injected by the Nuclear Medicine Technologist for sentinel lymph node localization.   DG Chest Portable 1 View  Result Date: 07/28/2021 CLINICAL DATA:  SOB EXAM: PORTABLE CHEST 1 VIEW COMPARISON:  None. FINDINGS: The heart is enlarged. No large pleural effusion. Fluffy bilateral pulmonary opacities. No pneumothorax. No acute osseous abnormality. Surgical clips in the right axilla. IMPRESSION: Cardiomegaly with bilateral pulmonary opacities suggestive of at least moderate pulmonary edema. Electronically Signed   By: Olive Bass M.D.   On: 07/28/2021 10:56   ECHOCARDIOGRAM COMPLETE  Result Date: 07/29/2021    ECHOCARDIOGRAM REPORT   Patient Name:   Kylie Saunders Date of Exam: 07/29/2021 Medical Rec #:  [id number]     Height:       62.0 in Accession #:    [id number]    Weight:       179.5 lb Date of Birth:  30-May-1961      BSA:          1.826 m Patient Age:    60 years      BP:           126/75 mmHg Patient Gender: F             HR:           86 bpm. Exam Location:  [facility] Procedure: 2D Echo, Color Doppler and Cardiac Doppler Indications:     Shock  History:         Patient has no prior history of Echocardiogram examinations.                  Risk Factors:Hypertension and  Dyslipidemia.  Sonographer:     Humphrey Rolls Referring Phys:  [id number] AMY N COX Diagnosing Phys: Alwyn Pea MD  Sonographer Comments: Suboptimal subcostal window. IMPRESSIONS  1. Left ventricular ejection fraction, by estimation, is 55 to 60%. The left ventricle has normal function. The left ventricle has no regional wall motion abnormalities. Left ventricular diastolic parameters are consistent with Grade I diastolic dysfunction (impaired relaxation).  2. Right ventricular systolic function is normal. The right ventricular size is normal.  3. The mitral valve is grossly normal. Trivial mitral valve regurgitation.  4. The aortic valve is normal in structure. Aortic valve regurgitation is not visualized. FINDINGS  Left Ventricle: Left ventricular ejection fraction, by estimation, is 55 to 60%. The left ventricle has normal function. The left ventricle has no regional wall motion abnormalities. The left ventricular internal cavity size was normal in size. There is  no left ventricular hypertrophy. Left ventricular diastolic parameters are consistent with Grade I diastolic dysfunction (impaired relaxation). Right Ventricle: The right ventricular size is normal. No increase in right ventricular wall thickness. Right ventricular systolic function is normal. Left Atrium: Left atrial size was normal in size. Right Atrium: Right atrial size was not assessed. Pericardium: There is no evidence of pericardial effusion. Mitral Valve: The mitral valve is grossly normal. Trivial mitral valve regurgitation. MV peak gradient, 3.0 mmHg. The mean mitral valve gradient is 2.0 mmHg. Tricuspid Valve: The tricuspid valve is normal in structure. Tricuspid valve regurgitation is not demonstrated. Aortic Valve: The aortic valve is normal in structure. Aortic valve regurgitation is not visualized. Aortic valve mean gradient measures 5.0 mmHg. Aortic valve peak gradient measures 10.6 mmHg. Aortic valve area, by VTI measures 2.42 cm.  Pulmonic Valve: The pulmonic valve was normal in structure. Pulmonic valve regurgitation is not visualized. Aorta: The ascending aorta was not well visualized. IAS/Shunts: No atrial level shunt detected by color flow Doppler.  LEFT VENTRICLE PLAX 2D LVIDd:         4.90 cm  Diastology LVIDs:         3.40 cm  LV e' medial:    5.44 cm/s LV PW:         0.90 cm  LV E/e' medial:  15.7 LV IVS:        0.70 cm  LV e' lateral:   7.94 cm/s LVOT diam:     2.00 cm  LV E/e' lateral: 10.7 LV SV:         62 LV SV Index:   34 LVOT Area:     3.14 cm  LEFT ATRIUM             Index LA diam:        2.60 cm 1.42 cm/m LA Vol (A2C):   47.5 ml 26.02 ml/m LA Vol (A4C):   39.1 ml 21.42 ml/m LA Biplane Vol: 43.4 ml 23.77 ml/m  AORTIC VALVE                    PULMONIC VALVE AV Area (Vmax):    1.93 cm     PV Vmax:       0.98 m/s AV Area (Vmean):   2.05 cm     PV Vmean:      60.200 cm/s AV Area (VTI):     2.42 cm     PV VTI:        0.154 m AV Vmax:           163.00 cm/s  PV Peak grad:  3.8 mmHg AV Vmean:          104.000 cm/s PV Mean grad:  2.0 mmHg AV VTI:            0.257 m AV Peak Grad:      10.6 mmHg AV Mean Grad:      5.0 mmHg LVOT Vmax:         99.90 cm/s  LVOT Vmean:        67.900 cm/s LVOT VTI:          0.198 m LVOT/AV VTI ratio: 0.77  AORTA Ao Root diam: 2.90 cm MITRAL VALVE MV Area (PHT): 5.70 cm    SHUNTS MV Area VTI:   2.70 cm    Systemic VTI:  0.20 m MV Peak grad:  3.0 mmHg    Systemic Diam: 2.00 cm MV Mean grad:  2.0 mmHg MV Vmax:       0.87 m/s MV Vmean:      60.9 cm/s MV Decel Time: 133 msec MV E velocity: 85.30 cm/s MV A velocity: 89.10 cm/s MV E/A ratio:  0.96 Dwayne D Callwood MD Electronically signed by Yolonda Kida MD Signature Date/Time: 07/29/2021/4:16:22 PM    Final       Subjective: No fever, no chest pain, no confusion.  No dyspnea, no cough, no malaise.  Feeling "great".  Discharge Exam: Vitals:   08/20/21 0818 08/20/21 1157  BP: 123/73 121/81  Pulse: 76 89  Resp: 16 18  Temp: 99 F (37.2 C)  98.8 F (37.1 C)  SpO2: 93% 93%   Vitals:   08/20/21 0115 08/20/21 0602 08/20/21 0818 08/20/21 1157  BP: 108/65 128/81 123/73 121/81  Pulse: 71 79 76 89  Resp: $Remo'16 16 16 18  'hkCZt$ Temp: 98.7 F (37.1 C) 99.2 F (37.3 C) 99 F (37.2 C) 98.8 F (37.1 C)  TempSrc:  Oral    SpO2: 94% 90% 93% 93%  Weight:      Height:        General: Pt is alert, awake, not in acute distress Cardiovascular: RRR, nl S1-S2, no murmurs appreciated.   No LE edema.   Respiratory: Normal respiratory rate and rhythm.  CTAB without rales or wheezes. Skin: Redness around the wound is resolved.  Clean dry and intact. Abdominal: Abdomen soft and non-tender.  No distension or HSM.   Neuro/Psych: Strength symmetric in upper and lower extremities.  Judgment and insight appear impaired but at baseline.   The results of significant diagnostics from this hospitalization (including imaging, microbiology, ancillary and laboratory) are listed below for reference.     Microbiology: Recent Results (from the past 240 hour(s))  Resp Panel by RT-PCR (Flu A&B, Covid) Nasopharyngeal Swab     Status: None   Collection Time: 08/16/21 11:30 PM   Specimen: Nasopharyngeal Swab; Nasopharyngeal(NP) swabs in vial transport medium  Result Value Ref Range Status   SARS Coronavirus 2 by RT PCR NEGATIVE NEGATIVE Final    Comment: (NOTE) SARS-CoV-2 target nucleic acids are NOT DETECTED.  The SARS-CoV-2 RNA is generally detectable in upper respiratory specimens during the acute phase of infection. The lowest concentration of SARS-CoV-2 viral copies this assay can detect is 138 copies/mL. A negative result does not preclude SARS-Cov-2 infection and should not be used as the sole basis for treatment or other patient management decisions. A negative result may occur with  improper specimen collection/handling, submission of specimen other than nasopharyngeal swab, presence of viral mutation(s) within the areas targeted by this assay, and  inadequate number of viral copies(<138 copies/mL). A negative result must be combined with clinical observations, patient history, and epidemiological information. The expected result is Negative.  Fact Sheet for Patients:  EntrepreneurPulse.com.au  Fact Sheet for Healthcare Providers:  IncredibleEmployment.be  This test is no t yet approved or cleared by the [place] FDA and  has been authorized for detection and/or diagnosis of SARS-CoV-2 by FDA  under an Emergency Use Authorization (EUA). This EUA will remain  in effect (meaning this test can be used) for the duration of the COVID-19 declaration under Section 564(b)(1) of the Act, 21 U.S.C.section 360bbb-3(b)(1), unless the authorization is terminated  or revoked sooner.       Influenza A by PCR NEGATIVE NEGATIVE Final   Influenza B by PCR NEGATIVE NEGATIVE Final    Comment: (NOTE) The Xpert Xpress SARS-CoV-2/FLU/RSV plus assay is intended as an aid in the diagnosis of influenza from Nasopharyngeal swab specimens and should not be used as a sole basis for treatment. Nasal washings and aspirates are unacceptable for Xpert Xpress SARS-CoV-2/FLU/RSV testing.  Fact Sheet for Patients: EntrepreneurPulse.com.au  Fact Sheet for Healthcare Providers: IncredibleEmployment.be  This test is not yet approved or cleared by the [place] FDA and has been authorized for detection and/or diagnosis of SARS-CoV-2 by FDA under an Emergency Use Authorization (EUA). This EUA will remain in effect (meaning this test can be used) for the duration of the COVID-19 declaration under Section 564(b)(1) of the Act, 21 U.S.C. section 360bbb-3(b)(1), unless the authorization is terminated or revoked.  Performed at [facility], [address] [place], [place] [address]   Culture, blood (Routine X 2) w Reflex to ID Panel     Status: None (Preliminary result)    Collection Time: 08/17/21  8:48 AM   Specimen: BLOOD  Result Value Ref Range Status   Specimen Description BLOOD [facility]  Final   Special Requests BOTTLES DRAWN AEROBIC AND ANAEROBIC BCAV  Final   Culture   Final    NO GROWTH 3 DAYS Performed at [facility], [address], [place], [place] [address]    Report Status PENDING  Incomplete  Aerobic/Anaerobic Culture w Gram Stain (surgical/deep wound)     Status: None (Preliminary result)   Collection Time: 08/17/21  2:22 PM   Specimen: Wound  Result Value Ref Range Status   Specimen Description   Final    WOUND Performed at [facility], [address] [place], [place] [address]    Special Requests   Final    right chest wound Performed at [facility], [address] [place], [place] [address]    Gram Stain   Final    ABUNDANT WBC PRESENT,BOTH PMN AND MONONUCLEAR ABUNDANT GRAM POSITIVE COCCI IN CLUSTERS Performed at [facility], [place] [address], [place],  [address]    Culture   Final    MODERATE METHICILLIN RESISTANT STAPHYLOCOCCUS AUREUS   Report Status PENDING  Incomplete   Organism ID, Bacteria METHICILLIN RESISTANT STAPHYLOCOCCUS AUREUS  Final      Susceptibility   Methicillin resistant staphylococcus aureus - MIC*    CIPROFLOXACIN >=8 RESISTANT Resistant     ERYTHROMYCIN >=8 RESISTANT Resistant     GENTAMICIN <=0.5 SENSITIVE Sensitive     OXACILLIN >=4 RESISTANT Resistant     TETRACYCLINE <=1 SENSITIVE Sensitive     VANCOMYCIN 1 SENSITIVE Sensitive     TRIMETH/SULFA <=10 SENSITIVE Sensitive     CLINDAMYCIN <=0.25 SENSITIVE Sensitive     RIFAMPIN <=0.5 SENSITIVE Sensitive     Inducible Clindamycin NEGATIVE Sensitive     * MODERATE METHICILLIN RESISTANT STAPHYLOCOCCUS AUREUS  Culture, blood (Routine X 2) w Reflex to ID Panel     Status: None (Preliminary result)   Collection Time: 08/17/21  5:04 PM   Specimen: BLOOD  Result Value Ref Range Status   Specimen Description BLOOD  BLOOD LEFT FOREARM  Final   Special Requests   Final    BOTTLES DRAWN AEROBIC AND ANAEROBIC Blood Culture results may not be optimal due to an inadequate volume of blood received in culture bottles   Culture   Final    NO GROWTH 3 DAYS Performed at [facility], [address] [place], [place] [address]    Report Status PENDING  Incomplete     Labs: BNP (last 3 results) Recent Labs    07/28/21 1017  BNP 259.5*   Basic Metabolic Panel: Recent Labs  Lab 08/17/21 0528 08/17/21 1704 08/18/21 0524 08/19/21 0640 08/20/21 0523  NA 134* 134* 138 139 140  K 3.9 4.5 4.2 3.7 3.3*  CL 98 102 99 104 104  CO2 26 21* $Remo'28 30 29  'gDcXf$ GLUCOSE 119* 111* 142* 123* 126*  BUN 36* 33* 33* 29* 25*  CREATININE 1.83* 1.84* 1.57* 1.06* 0.96  CALCIUM 8.0* 8.0* 8.5* 7.9* 7.9*  MG  --  1.9 2.2  --   --   PHOS  --  4.5 4.7*  --   --    Liver Function Tests: Recent Labs  Lab 08/16/21 1351  AST 10*  ALT 12  ALKPHOS 59  BILITOT 0.8  PROT 6.6  ALBUMIN 2.8*   No results for input(s): LIPASE, AMYLASE in the last 168 hours. No results for input(s): AMMONIA in the last 168 hours. CBC: Recent Labs  Lab 08/16/21 1351 08/17/21 0528 08/17/21 1704 08/18/21 0524 08/19/21 0640  WBC 7.1 7.1 6.7 6.8 10.0  NEUTROABS 3.7  --  3.0 4.8  --   HGB 10.5* 9.9* 9.7* 9.8* 9.4*  HCT 30.7* 30.4* 31.4* 30.5* 29.6*  MCV 89.2 91.8 95.2 91.9 90.8  PLT 257 228 251 230 252   Cardiac Enzymes: No results for input(s): CKTOTAL, CKMB, CKMBINDEX, TROPONINI in the last 168 hours. BNP: Invalid input(s): POCBNP CBG: Recent Labs  Lab 08/17/21 1753 08/19/21 2116  GLUCAP 106* 136*   D-Dimer No results for input(s): DDIMER in the last 72 hours. Hgb A1c No results for input(s): HGBA1C in the last 72 hours. Lipid Profile No results for input(s): CHOL, HDL, LDLCALC, TRIG, CHOLHDL, LDLDIRECT in the last 72 hours. Thyroid function studies No results for input(s): TSH, T4TOTAL, T3FREE, THYROIDAB in the last 72  hours.  Invalid input(s): FREET3 Anemia work up No results for input(s): VITAMINB12, FOLATE, FERRITIN, TIBC, IRON, RETICCTPCT in the last 72 hours. Urinalysis    Component Value Date/Time   COLORURINE YELLOW (A) 08/16/2021 1032   APPEARANCEUR HAZY (A) 08/16/2021 1032   LABSPEC 1.017 08/16/2021 1032   PHURINE 5.0 08/16/2021 1032   GLUCOSEU NEGATIVE 08/16/2021 1032   HGBUR NEGATIVE 08/16/2021 1032   BILIRUBINUR NEGATIVE 08/16/2021 1032   KETONESUR NEGATIVE 08/16/2021 1032   PROTEINUR 30 (A) 08/16/2021 1032   NITRITE NEGATIVE 08/16/2021 1032   LEUKOCYTESUR TRACE (A) 08/16/2021 1032   Sepsis Labs Invalid input(s): PROCALCITONIN,  WBC,  LACTICIDVEN Microbiology Recent Results (from the past 240 hour(s))  Resp Panel by RT-PCR (Flu A&B, Covid) Nasopharyngeal Swab     Status: None   Collection Time: 08/16/21 11:30 PM   Specimen: Nasopharyngeal Swab; Nasopharyngeal(NP) swabs in vial transport medium  Result Value Ref Range Status   SARS Coronavirus 2 by RT PCR NEGATIVE NEGATIVE Final    Comment: (NOTE) SARS-CoV-2 target nucleic acids are NOT DETECTED.  The SARS-CoV-2 RNA is generally detectable in upper respiratory specimens during the acute phase of infection. The lowest concentration of SARS-CoV-2 viral copies this assay  can detect is 138 copies/mL. A negative result does not preclude SARS-Cov-2 infection and should not be used as the sole basis for treatment or other patient management decisions. A negative result may occur with  improper specimen collection/handling, submission of specimen other than nasopharyngeal swab, presence of viral mutation(s) within the areas targeted by this assay, and inadequate number of viral copies(<138 copies/mL). A negative result must be combined with clinical observations, patient history, and epidemiological information. The expected result is Negative.  Fact Sheet for Patients:  BloggerCourse.com  Fact Sheet for  Healthcare Providers:  SeriousBroker.it  This test is no t yet approved or cleared by the [place] FDA and  has been authorized for detection and/or diagnosis of SARS-CoV-2 by FDA under an Emergency Use Authorization (EUA). This EUA will remain  in effect (meaning this test can be used) for the duration of the COVID-19 declaration under Section 564(b)(1) of the Act, 21 U.S.C.section 360bbb-3(b)(1), unless the authorization is terminated  or revoked sooner.       Influenza A by PCR NEGATIVE NEGATIVE Final   Influenza B by PCR NEGATIVE NEGATIVE Final    Comment: (NOTE) The Xpert Xpress SARS-CoV-2/FLU/RSV plus assay is intended as an aid in the diagnosis of influenza from Nasopharyngeal swab specimens and should not be used as a sole basis for treatment. Nasal washings and aspirates are unacceptable for Xpert Xpress SARS-CoV-2/FLU/RSV testing.  Fact Sheet for Patients: BloggerCourse.com  Fact Sheet for Healthcare Providers: SeriousBroker.it  This test is not yet approved or cleared by the [place] FDA and has been authorized for detection and/or diagnosis of SARS-CoV-2 by FDA under an Emergency Use Authorization (EUA). This EUA will remain in effect (meaning this test can be used) for the duration of the COVID-19 declaration under Section 564(b)(1) of the Act, 21 U.S.C. section 360bbb-3(b)(1), unless the authorization is terminated or revoked.  Performed at [facility], [address] [place], [place] [address]   Culture, blood (Routine X 2) w Reflex to ID Panel     Status: None (Preliminary result)   Collection Time: 08/17/21  8:48 AM   Specimen: BLOOD  Result Value Ref Range Status   Specimen Description BLOOD [facility]  Final   Special Requests BOTTLES DRAWN AEROBIC AND ANAEROBIC BCAV  Final   Culture   Final    NO GROWTH 3 DAYS Performed at [facility], [address] [place], [place] [address]    Report Status PENDING  Incomplete  Aerobic/Anaerobic Culture w Gram Stain (surgical/deep wound)     Status: None (Preliminary result)   Collection Time: 08/17/21  2:22 PM   Specimen: Wound  Result Value Ref Range Status   Specimen Description   Final    WOUND Performed at [facility], [address] [place], [place] [address]    Special Requests   Final    right chest wound Performed at [facility], [address] [place], [place] [address]    Gram Stain   Final    ABUNDANT WBC PRESENT,BOTH PMN AND MONONUCLEAR ABUNDANT GRAM POSITIVE COCCI IN CLUSTERS Performed at [facility], [address], [place], [place] [address]    Culture   Final    MODERATE METHICILLIN RESISTANT STAPHYLOCOCCUS AUREUS   Report Status PENDING  Incomplete   Organism ID, Bacteria METHICILLIN RESISTANT STAPHYLOCOCCUS AUREUS  Final      Susceptibility   Methicillin resistant staphylococcus aureus - MIC*    CIPROFLOXACIN >=8 RESISTANT Resistant  ERYTHROMYCIN >=8 RESISTANT Resistant     GENTAMICIN <=0.5 SENSITIVE Sensitive     OXACILLIN >=4 RESISTANT Resistant     TETRACYCLINE <=1 SENSITIVE Sensitive     VANCOMYCIN 1 SENSITIVE Sensitive     TRIMETH/SULFA <=10 SENSITIVE Sensitive     CLINDAMYCIN <=0.25 SENSITIVE Sensitive     RIFAMPIN <=0.5 SENSITIVE Sensitive     Inducible Clindamycin NEGATIVE Sensitive     * MODERATE METHICILLIN RESISTANT STAPHYLOCOCCUS AUREUS  Culture, blood (Routine X 2) w Reflex to ID Panel     Status: None (Preliminary result)   Collection Time: 08/17/21  5:04 PM   Specimen: BLOOD  Result Value Ref Range Status   Specimen Description BLOOD BLOOD LEFT FOREARM  Final   Special Requests   Final    BOTTLES DRAWN AEROBIC AND ANAEROBIC Blood Culture results may not be optimal due to an inadequate volume of blood received in culture bottles   Culture   Final    NO GROWTH 3 DAYS Performed at [facility], [address], [place], [place] [address]    Report Status PENDING  Incomplete     Time coordinating discharge: 35 minutes    30 Day Unplanned Readmission Risk Score    Flowsheet Row ED to Hosp-Admission (Current) from 08/16/2021 in [place] (1C)  30 Day Unplanned Readmission Risk Score (%) 23.29 Filed at 08/20/2021 1200       This score is the patient's risk of an unplanned readmission within 30 days of being discharged (0 -100%). The score is based on dignosis, age, lab data, medications, orders, and past utilization.   Low:  0-14.9   Medium: 15-21.9   High: 22-29.9   Extreme: 30 and above            SIGNED:   Edwin Dada, MD  [facility] 08/20/2021, 1:41 PM

## 2021-08-20 NOTE — TOC Progression Note (Addendum)
Transition of Care [facility]) - Progression Note    Patient Details  Name: Kylie Saunders MRN: [id number] Date of Birth: 1961/10/16  Transition of Care [facility]) CM/SW [place], RN Phone Number: 08/20/2021, 12:52 PM  Clinical Narrative:  Patient can return to [place] today:  [place] [address]  [place] [id number] [phone].    As per Jaclyn Prime, they are able to care for patient with dressings and JP drain, as they have been taught during a previous return to facility  [facility] can transport to group facility. Family aware of transport Expected Discharge Plan: Group Home Barriers to Discharge: Continued Medical Work up  Expected Discharge Plan and Services Expected Discharge Plan: Group Home   Discharge Planning Services: CM Consult   Living arrangements for the past 2 months: Group Home Expected Discharge Date: 08/20/21               DME Arranged: N/A DME Agency: NA       HH Arranged: NA           Social Determinants of Health (SDOH) Interventions    Readmission Risk Interventions No flowsheet data found.

## 2021-08-22 ENCOUNTER — Telehealth: Payer: Self-pay

## 2021-08-22 ENCOUNTER — Encounter: Payer: Self-pay | Admitting: Oncology

## 2021-08-22 DIAGNOSIS — C50811 Malignant neoplasm of overlapping sites of right female breast: Secondary | ICD-10-CM

## 2021-08-22 DIAGNOSIS — Z170 Estrogen receptor positive status [ER+]: Secondary | ICD-10-CM

## 2021-08-22 LAB — AEROBIC/ANAEROBIC CULTURE W GRAM STAIN (SURGICAL/DEEP WOUND)

## 2021-08-22 LAB — CULTURE, BLOOD (ROUTINE X 2)
Culture: NO GROWTH
Culture: NO GROWTH

## 2021-08-22 NOTE — Telephone Encounter (Signed)
-----   Message from Earlie Server, MD sent at 08/22/2021  4:17 PM EDT ----- Please arrange her to see me a few days after PET, with labs, FSH, estradiol, cbc cmp and also see Alyson too same day. Thanks.

## 2021-08-22 NOTE — Telephone Encounter (Signed)
PET scheduled for 10/11.

## 2021-08-22 NOTE — Telephone Encounter (Signed)
Please schedule patient for lab/MD/ Alyson a few days after PET.

## 2021-08-23 ENCOUNTER — Encounter: Payer: Medicare HMO | Admitting: Surgery

## 2021-08-23 ENCOUNTER — Encounter: Payer: Self-pay | Admitting: Oncology

## 2021-08-23 NOTE — Telephone Encounter (Signed)
Patient scheduled,Spoke with group home manager to confirm.

## 2021-08-25 ENCOUNTER — Ambulatory Visit (INDEPENDENT_AMBULATORY_CARE_PROVIDER_SITE_OTHER): Payer: Medicare HMO | Admitting: Surgery

## 2021-08-25 ENCOUNTER — Encounter: Payer: Self-pay | Admitting: Surgery

## 2021-08-25 ENCOUNTER — Other Ambulatory Visit: Payer: Self-pay

## 2021-08-25 VITALS — BP 106/71 | HR 80 | Temp 98.7°F | Ht 59.0 in | Wt 178.6 lb

## 2021-08-25 DIAGNOSIS — Z09 Encounter for follow-up examination after completed treatment for conditions other than malignant neoplasm: Secondary | ICD-10-CM

## 2021-08-25 DIAGNOSIS — A4902 Methicillin resistant Staphylococcus aureus infection, unspecified site: Secondary | ICD-10-CM | POA: Insufficient documentation

## 2021-08-25 MED ORDER — SULFAMETHOXAZOLE-TRIMETHOPRIM 800-160 MG PO TABS: 1.0000 | ORAL_TABLET | Freq: Two times a day (BID) | ORAL | 0 refills | Status: AC

## 2021-08-25 NOTE — Progress Notes (Signed)
[facility] POST-OP OFFICE VISIT  08/25/2021  HPI: Kylie Saunders is a 60 y.o. female 1 month status post right mastectomy, 9 days s/p open drainage of infected postmastectomy seroma with replacement of drain.  She presents today with diminishing drain output, however it appears that they are not keeping the Jackson-Pratt bulb adequately charged having become a passive drain, it has not kept up with the fluid.  When charging the drain in the office today she drained additional serous fluid immediately.  She has a couple more days left on her antibiotics.  And with her caseworker present woman sure that the drain is adequately charged over the next week.  She otherwise has no complaints.  Her range of motion is good.  Vital signs: BP 106/71   Pulse 80   Temp 98.7 F (37.1 C)   Ht 4\' 11"  (1.499 m)   Wt 178 lb 9.6 oz (81 kg)   SpO2 95%   BMI 36.07 kg/m    Physical Exam: Constitutional: She appears well. Right chest wall, incision is clean dry and intact.  There is no evidence of residual abscess or seroma at present. Skin: Moderately inflamed around the drain site as anticipated.  Drain has nearly perfectly clear serous fluid.  Assessment/Plan: This is a 60 y.o. female 9 days s/p replacement of drain for right postmastectomy seroma with subsequent infection.  Appears to be progressing well on current antibiotic regimen of Septra for MRSA.  Patient Active Problem List   Diagnosis Date Noted   Acute respiratory failure with hypoxia and hypercapnia ([place]) 08/18/2021   Hyponatremia 43/32/9518   Acute metabolic encephalopathy 84/16/6063   Normocytic anemia 08/18/2021   Cellulitis, wound, post-operative 08/17/2021   Hypokalemia 08/17/2021   Essential hypertension 08/17/2021   AKI (acute kidney injury) ([place]) 08/16/2021   Malignant neoplasm of right female breast ([place]) 08/14/2021   PNA (pneumonia) 07/28/2021   Sepsis ([place]) 07/28/2021   Aspiration pneumonia ([place]) 07/28/2021    Septic shock ([place]) 07/28/2021   Malignant neoplasm of overlapping sites of right breast in female, estrogen receptor positive ([place]) 01/60/1093   Biliary colic 23/55/7322   Ruptured globe of right eye 12/22/2016   Left upper quadrant pain    Acute pancreatitis    Abdominal pain 12/16/2016   Schizophrenia, paranoid ([place]) 11/03/2016   Right eye injury 11/03/2016   HLD (hyperlipidemia) 07/25/2012   Intellectual disability 07/25/2012   Cancer of body of uterus ([place]) 05/31/2012   Malignant neoplasm of body of uterus ([employer]) 05/31/2012    -Continue her antibiotics while drain is in place.  Maintaining discharge on the Jackson-Pratt bulb will ensure that her output diminishes over the next week.  We will have her follow-up in 1 week.   Ronny Bacon M.D., FACS 08/25/2021, 11:48 AM

## 2021-08-25 NOTE — Patient Instructions (Signed)
Follow up next week Empty your drain 2-3 times a day or as needed  [place] Surgical drains are used to remove extra fluid that normally builds up in a surgical wound after surgery. A surgical drain helps to heal a surgical wound. Different kinds of surgical drains include: Active drains. These drains use suction to pull drainage away from the surgical wound. Drainage flows through a tube to a container outside of the body. With these drains, you need to keep the bulb or the drainage container flat (compressed) at all times, except while you empty it. Flattening the bulb or container creates suction. A drain is placed during surgery. Right after surgery, drainage is usually bright red and a little thicker than water. The drainage may gradually turn yellow or pink and become thinner. It is likely that your health care provider will remove the drain when the drainage stops or when the amount decreases to 1-2 Tbsp (15-30 mL) during a 24-hour period. Supplies needed: Tape. Germ-free cleaning solution (sterile saline). Cotton swabs. Split gauze drain sponge: 4 x 4 inches (10 x 10 cm). Gauze square: 4 x 4 inches (10 x 10 cm). How to care for your surgical drain Care for your drain as told by your health care provider. This is important to help prevent infection. If your drain is placed at your back, or any other hard-to-reach area, ask another person to assist you in performing the following tasks: General care Keep the skin around the drain dry and covered with a dressing at all times. Check your drain area every day for signs of infection. Check for: Redness, swelling, or pain. Pus or a bad smell. Cloudy drainage. Tenderness or pressure at the drain exit site. Changing the dressing Follow instructions from your health care provider about how to change your dressing. Change your dressing at least once a day. Change it more often if needed to keep the dressing dry. Make sure  you: Gather your supplies. Wash your hands with soap and water before you change your dressing. If soap and water are not available, use hand sanitizer. Remove the old dressing. Avoid using scissors to do that. Wash your hands with soap and water again after removing the old dressing. Use sterile saline to clean your skin around the drain. You may need to use a cotton swab to clean the skin. Place the tube through the slit in a drain sponge. Place the drain sponge so that it covers your wound. Place the gauze square or another drain sponge on top of the drain sponge that is on the wound. Make sure the tube is between those layers. Tape the dressing to your skin. Tape the drainage tube to your skin 1-2 inches (2.5-5 cm) below the place where the tube enters your body. Taping keeps the tube from pulling on any stitches (sutures) that you have. Wash your hands with soap and water. Write down the color of your drainage and how often you change your dressing. How to empty your active drain  Make sure that you have a measuring cup that you can empty your drainage into. Wash your hands with soap and water. If soap and water are not available, use hand sanitizer. Loosen any pins or clips that hold the tube in place. If your health care provider tells you to strip the tube to prevent clots and tube blockages: Hold the tube at the skin with one hand. Use your other hand to pinch the tubing with your  thumb and first finger. Gently move your fingers down the tube while squeezing very lightly. This clears any drainage, clots, or tissue from the tube. You may need to do this several times each day to keep the tube clear. Do not pull on the tube. Open the bulb cap or the drain plug. Do not touch the inside of the cap or the bottom of the plug. Turn the device upside down and gently squeeze. Empty all of the drainage into the measuring cup. Compress the bulb or the container and replace the cap or the plug.  To compress the bulb or the container, squeeze it firmly in the middle while you close the cap or plug the container. Write down the amount of drainage that you have in each 24-hour period. If you have less than 2 Tbsp (30 mL) of drainage during 24 hours, contact your health care provider. Flush the drainage down the toilet. Wash your hands with soap and water. Contact a health care provider if: You have redness, swelling, or pain around your drain area. You have pus or a bad smell coming from your drain area. You have a fever or chills. The skin around your drain is warm to the touch. The amount of drainage that you have is increasing instead of decreasing. You have drainage that is cloudy. There is a sudden stop or a sudden decrease in the amount of drainage that you have. Your drain tube falls out. Your active drain does not stay compressed after you empty it. Summary Surgical drains are used to remove extra fluid that normally builds up in a surgical wound after surgery. Different kinds of surgical drains include active drains and passive drains. Active drains use suction to pull drainage away from the surgical wound, and passive drains allow fluid to drain naturally. It is important to care for your drain to prevent infection. If your drain is placed at your back, or any other hard-to-reach area, ask another person to assist you. Contact your health care provider if you have redness, swelling, or pain around your drain area. This information is not intended to replace advice given to you by your health care provider. Make sure you discuss any questions you have with your health care provider. Document Revised: 12/11/2018 Document Reviewed: 12/11/2018 Elsevier Patient Education  2022 [employer].

## 2021-08-30 ENCOUNTER — Ambulatory Visit
Admission: RE | Admit: 2021-08-30 | Discharge: 2021-08-30 | Disposition: A | Discharge status: Home / Self Care | Payer: Medicare HMO | Source: Ambulatory Visit | Attending: Oncology | Admitting: Oncology

## 2021-08-30 ENCOUNTER — Other Ambulatory Visit: Payer: Self-pay

## 2021-08-30 DIAGNOSIS — C50811 Malignant neoplasm of overlapping sites of right female breast: Secondary | ICD-10-CM | POA: Insufficient documentation

## 2021-08-30 DIAGNOSIS — Z170 Estrogen receptor positive status [ER+]: Secondary | ICD-10-CM | POA: Insufficient documentation

## 2021-08-30 LAB — GLUCOSE, CAPILLARY: Glucose-Capillary: 111 mg/dL — ABNORMAL HIGH (ref 70–99)

## 2021-08-30 MED ORDER — FLUDEOXYGLUCOSE F - 18 (FDG) INJECTION
9.2000 | Freq: Once | INTRAVENOUS | Status: AC
  Administered 2021-08-30: 9.2 via INTRAVENOUS

## 2021-09-01 ENCOUNTER — Other Ambulatory Visit: Payer: Self-pay

## 2021-09-01 ENCOUNTER — Inpatient Hospital Stay (HOSPITAL_BASED_OUTPATIENT_CLINIC_OR_DEPARTMENT_OTHER): Payer: Medicare HMO | Admitting: Oncology

## 2021-09-01 ENCOUNTER — Inpatient Hospital Stay: Payer: Medicare HMO | Attending: Oncology

## 2021-09-01 ENCOUNTER — Encounter: Payer: Self-pay | Admitting: Surgery

## 2021-09-01 ENCOUNTER — Inpatient Hospital Stay: Payer: Medicare HMO | Admitting: Pharmacist

## 2021-09-01 ENCOUNTER — Ambulatory Visit (INDEPENDENT_AMBULATORY_CARE_PROVIDER_SITE_OTHER): Payer: Medicare HMO | Admitting: Surgery

## 2021-09-01 VITALS — BP 114/74 | HR 90 | Temp 98.9°F | Ht 59.0 in | Wt 177.0 lb

## 2021-09-01 VITALS — BP 112/77 | HR 82 | Temp 98.3°F | Resp 16 | Wt 175.6 lb

## 2021-09-01 DIAGNOSIS — Z79899 Other long term (current) drug therapy: Secondary | ICD-10-CM | POA: Diagnosis not present

## 2021-09-01 DIAGNOSIS — Z170 Estrogen receptor positive status [ER+]: Secondary | ICD-10-CM | POA: Insufficient documentation

## 2021-09-01 DIAGNOSIS — Z9011 Acquired absence of right breast and nipple: Secondary | ICD-10-CM | POA: Insufficient documentation

## 2021-09-01 DIAGNOSIS — Z90710 Acquired absence of both cervix and uterus: Secondary | ICD-10-CM | POA: Insufficient documentation

## 2021-09-01 DIAGNOSIS — C50811 Malignant neoplasm of overlapping sites of right female breast: Secondary | ICD-10-CM | POA: Diagnosis present

## 2021-09-01 DIAGNOSIS — D649 Anemia, unspecified: Secondary | ICD-10-CM

## 2021-09-01 DIAGNOSIS — Z90722 Acquired absence of ovaries, bilateral: Secondary | ICD-10-CM | POA: Diagnosis not present

## 2021-09-01 DIAGNOSIS — N6489 Other specified disorders of breast: Secondary | ICD-10-CM | POA: Insufficient documentation

## 2021-09-01 DIAGNOSIS — Z09 Encounter for follow-up examination after completed treatment for conditions other than malignant neoplasm: Secondary | ICD-10-CM

## 2021-09-01 DIAGNOSIS — Z7189 Other specified counseling: Secondary | ICD-10-CM | POA: Diagnosis not present

## 2021-09-01 DIAGNOSIS — Z7982 Long term (current) use of aspirin: Secondary | ICD-10-CM | POA: Diagnosis not present

## 2021-09-01 LAB — CBC WITH DIFFERENTIAL/PLATELET
Abs Immature Granulocytes: 0.02 10*3/uL (ref 0.00–0.07)
Basophils Absolute: 0.1 10*3/uL (ref 0.0–0.1)
Basophils Relative: 1 %
Eosinophils Absolute: 0.3 10*3/uL (ref 0.0–0.5)
Eosinophils Relative: 5 %
HCT: 35.2 % — ABNORMAL LOW (ref 36.0–46.0)
Hemoglobin: 10.8 g/dL — ABNORMAL LOW (ref 12.0–15.0)
Immature Granulocytes: 0 %
Lymphocytes Relative: 22 %
Lymphs Abs: 1.5 10*3/uL (ref 0.7–4.0)
MCH: 28.8 pg (ref 26.0–34.0)
MCHC: 30.7 g/dL (ref 30.0–36.0)
MCV: 93.9 fL (ref 80.0–100.0)
Monocytes Absolute: 0.7 10*3/uL (ref 0.1–1.0)
Monocytes Relative: 10 %
Neutro Abs: 4.4 10*3/uL (ref 1.7–7.7)
Neutrophils Relative %: 62 %
Platelets: 308 10*3/uL (ref 150–400)
RBC: 3.75 MIL/uL — ABNORMAL LOW (ref 3.87–5.11)
RDW: 14.8 % (ref 11.5–15.5)
WBC: 7 10*3/uL (ref 4.0–10.5)
nRBC: 0 % (ref 0.0–0.2)

## 2021-09-01 LAB — COMPREHENSIVE METABOLIC PANEL
ALT: 13 U/L (ref 0–44)
AST: 12 U/L — ABNORMAL LOW (ref 15–41)
Albumin: 3.4 g/dL — ABNORMAL LOW (ref 3.5–5.0)
Alkaline Phosphatase: 66 U/L (ref 38–126)
Anion gap: 8 (ref 5–15)
BUN: 12 mg/dL (ref 8–23)
CO2: 31 mmol/L (ref 22–32)
Calcium: 8.8 mg/dL — ABNORMAL LOW (ref 8.9–10.3)
Chloride: 96 mmol/L — ABNORMAL LOW (ref 98–111)
Creatinine, Ser: 0.99 mg/dL (ref 0.44–1.00)
GFR, Estimated: >60 mL/min (ref >60)
Glucose, Bld: 102 mg/dL — ABNORMAL HIGH (ref 70–99)
Potassium: 4.3 mmol/L (ref 3.5–5.1)
Sodium: 135 mmol/L (ref 135–145)
Total Bilirubin: 0.4 mg/dL (ref 0.3–1.2)
Total Protein: 6.9 g/dL (ref 6.5–8.1)

## 2021-09-01 MED ORDER — ABEMACICLIB 150 MG PO TABS
150.0000 mg | ORAL_TABLET | Freq: Two times a day (BID) | ORAL | 1 refills | Status: DC
Start: 2021-09-01 — End: 2021-10-20

## 2021-09-01 MED ORDER — CALCIUM 600 MG PO TABS: 1200.0000 mg | ORAL_TABLET | Freq: Every day | ORAL | 3 refills | Status: AC

## 2021-09-01 NOTE — Patient Instructions (Signed)
Continue to record the drain output. Please see your follow up appointment listed below.

## 2021-09-01 NOTE — Progress Notes (Signed)
No concerns/complaints at this time.

## 2021-09-01 NOTE — Progress Notes (Signed)
[place]  [phone] [phone]  Patient Care Team: Novella Rob, FNP as PCP - General (Family Medicine)   Name of the patient: Kylie Saunders  [id number]  [birth year]   Date of visit: 09/01/21  HPI: Patient is a 60 y.o. female with stage IB breast cancer, ER/PR positive, HER2 negative. Planned adjuvant treatment with Verzenio (abemaciclib).   Reason for Consult: Abemaciclib oral chemotherapy education.   PAST MEDICAL HISTORY: Past Medical History:  Diagnosis Date   Anxiety    Cancer of body of uterus ([place]) 05/31/2012   Endometrial cancer, had hysterectomy w/BSO 07/25/12   GERD (gastroesophageal reflux disease)    History of 2019 novel coronavirus disease (COVID-19) 12/14/2020   HLD (hyperlipidemia) 07/25/2012   Hypertension    Intellectual disability 07/25/2012   Obesity    Pancreatitis due to biliary obstruction    Paranoid schizophrenia ([place])    Right eye injury 11/03/2016    HEMATOLOGY/ONCOLOGY HISTORY:  Oncology History  Malignant neoplasm of overlapping sites of right breast in female, estrogen receptor positive ([place])  07/18/2021 Initial Diagnosis   Malignant neoplasm of overlapping sites of right breast in female, estrogen receptor positive ([place])   08/03/2021 Cancer Staging   Staging form: Breast, AJCC 8th Edition - Pathologic stage from 08/03/2021: Stage IB (pT2, pN2a, cM0, G2, ER+, PR+, HER2-) - Signed by Earlie Server, MD on 08/03/2021 Histologic grading system: 3 grade system   08/15/2021 -  Chemotherapy    Patient is on Treatment Plan: BREAST ADJUVANT DOSE DENSE AC Q14D / PACLITAXEL Q7D         ALLERGIES:  has No Known Allergies.  MEDICATIONS:  Current Outpatient Medications  Medication Sig Dispense Refill   amLODipine (NORVASC) 5 MG tablet Take 5 mg by mouth daily.     Ascorbic Acid (VITAMIN C) 100 MG tablet Take by mouth daily.     aspirin 81 MG chewable tablet CHEW ONE TABLET BY  MOUTH EVERY DAY     benztropine (COGENTIN) 2 MG tablet Take 1 tablet (2 mg total) by mouth daily. 30 tablet 2   calcium carbonate (OS-CAL) 600 MG tablet Take 2 tablets (1,200 mg total) by mouth daily. 60 tablet 3   loratadine (CLARITIN) 10 MG tablet Take 10 mg by mouth daily.     LORazepam (ATIVAN) 0.5 MG tablet Take 1 tablet (0.5 mg total) by mouth 2 (two) times daily. 60 tablet 2   omeprazole (PRILOSEC) 40 MG capsule Take 40 mg by mouth daily.     Polyvinyl Alcohol-Povidone (REFRESH OP) Apply 1 application to eye 2 (two) times daily. Right eye only     risperiDONE (RISPERDAL) 2 MG tablet Take 1 tablet (2 mg total) by mouth 2 (two) times daily. 60 tablet 2   simvastatin (ZOCOR) 40 MG tablet Take 40 mg by mouth daily.      sulfamethoxazole-trimethoprim (BACTRIM DS) 800-160 MG tablet Take 1 tablet by mouth 2 (two) times daily for 7 days. 14 tablet 0   zinc gluconate 50 MG tablet Take by mouth daily.     No current facility-administered medications for this visit.    VITAL SIGNS: There were no vitals taken for this visit. There were no vitals filed for this visit.  Estimated body mass index is 35.47 kg/m as calculated from the following:   Height as of 08/25/21: $RemoveBef'4\' 11"'zVxREypiDn$  (1.499 m).   Weight as of an earlier encounter on 09/01/21: 79.7 kg (175 lb 9.6 oz).  LABS: CBC:  Component Value Date/Time   WBC 7.0 09/01/2021 1053   HGB 10.8 (L) 09/01/2021 1053   HGB 12.7 06/05/2016 0957   HCT 35.2 (L) 09/01/2021 1053   HCT 38.3 06/05/2016 0957   PLT 308 09/01/2021 1053   PLT 249 06/05/2016 0957   MCV 93.9 09/01/2021 1053   MCV 90 06/05/2016 0957   NEUTROABS 4.4 09/01/2021 1053   NEUTROABS 6.4 06/05/2016 0957   LYMPHSABS 1.5 09/01/2021 1053   LYMPHSABS 1.6 06/05/2016 0957   MONOABS 0.7 09/01/2021 1053   EOSABS 0.3 09/01/2021 1053   EOSABS 0.3 06/05/2016 0957   BASOSABS 0.1 09/01/2021 1053   BASOSABS 0.0 06/05/2016 0957   Comprehensive Metabolic Panel:    Component Value Date/Time    NA 135 09/01/2021 1053   NA 141 06/05/2016 0957   K 4.3 09/01/2021 1053   CL 96 (L) 09/01/2021 1053   CO2 31 09/01/2021 1053   BUN 12 09/01/2021 1053   BUN 8 06/05/2016 0957   CREATININE 0.99 09/01/2021 1053   GLUCOSE 102 (H) 09/01/2021 1053   CALCIUM 8.8 (L) 09/01/2021 1053   AST 12 (L) 09/01/2021 1053   ALT 13 09/01/2021 1053   ALKPHOS 66 09/01/2021 1053   BILITOT 0.4 09/01/2021 1053   BILITOT <0.2 06/05/2016 0957   PROT 6.9 09/01/2021 1053   PROT 6.9 06/05/2016 0957   ALBUMIN 3.4 (L) 09/01/2021 1053   ALBUMIN 4.0 06/05/2016 0957     Present during today's visit: patient and Jaclyn Prime (group home caregiver)  Assessment and Plan: Start plan: Patient recovering from infection, will delay getting started on abemaciclib until after next office visit on 09/22/21.   Patient Education I spoke with patient for overview of new oral chemotherapy medication: abemaciclib, adjuvant treatment  Administration: Counseled patient on administration, dosing, side effects, monitoring, drug-food interactions, safe handling, storage, and disposal. Patient will take 1 tablet ($RemoveB'150mg'eJctDnVv$ ) by mouth twice daily.  Side Effects: Side effects include but not limited to: diarrhea, N/V, fatigue, decrease in wbc/hgb/plt.   Diarrhea: will provide loperamide prn prescription with initiation so that, so that it can be given as needed. Informed to call the office if patient has four or more loose stools a day Nausea: will provide antiemetic prn prescription with initiation so that, so that it can be given as needed for nausea  Drug-drug Interactions (DDI): No DDIs currently  Adherence: After discussion with patient no patient barriers to medication adherence identified.  Reviewed with patient importance of keeping a medication schedule and plan for any missed doses.  [name] and Jaclyn Prime voiced understanding and appreciation. All questions answered. Medication handout provided.  Provided patient with  [facility] phone number. Patient knows to call the office with questions or concerns. [facility] will continue to follow.  Patient expressed understanding and was in agreement with this plan. She also understands that She can call clinic at any time with any questions, concerns, or complaints.   Medication Access Issues: Patient signed manufacturer assistance paperwork at today's visit  Follow-up plan: RTC in 3 weeks for re-edu and treatment initiation  Thank you for allowing me to participate in the care of this patient.   Time Total: 30 mins  Visit consisted of counseling and education on dealing with issues of symptom management in the setting of serious and potentially life-threatening illness.Greater than 50%  of this time was spent counseling and coordinating care related to the above assessment and plan.  Signed by: Darl Pikes, PharmD, BCPS,  BCOP, CPP Hematology/Oncology Clinical Pharmacist Practitioner [facility] [phone]  09/01/2021 12:12 PM

## 2021-09-01 NOTE — Progress Notes (Signed)
Hematology/Oncology progress note [facility] [phone] [phone]   Patient Care Team: Novella Rob, FNP as PCP - General (Family Medicine)  REFERRING PROVIDER: Novella Rob, FNP  CHIEF COMPLAINTS/REASON FOR VISIT:  Follow up for right breast cancer  HISTORY OF PRESENTING ILLNESS:   Kylie Saunders is a  60 y.o.  female with PMH listed below was seen in consultation at the request of  Novella Rob, FNP  for evaluation of right breast cancer.   Patient lives in a group home. Accompanied by group home staff Rogers,Dorothea. Mr.Patrick Manganello is her legal guardian.  Patient felt right breast mass last year and initially she did not tell anyone until recently, she told her group home staff.  06/20/2021 bilateral diagnostic breast mammogram showed medial right breast highly suspicious 1.3cm with overlying skin dimpling and skin thickening concerning for liver skin involvement.  Normal right axillary ultrasound. No mass in left breast.   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  ER 90%, PR 90%, HER2 negative.  Patient denies any family history of breast cancer. Patient has a history of uterus cancer, status post hysterectomy.  Details unknown. She has paranoid schizophrenia and intellectual disability.  Patient follows up with psychiatry.  07/27/2021 she underwent right mastectomy DIAGNOSIS:  A. RIGHT BREAST WITH SENTINEL NODE #1; MODIFIED RADICAL MASTECTOMY:  - INVASIVE MAMMARY CARCINOMA.  - DUCTAL CARCINOMA IN SITU.  - METASTATIC CARCINOMA INVOLVING TWO OF THREE LYMPH NODES (2/3).  - SEE CANCER SUMMARY BELOW.   B. SENTINEL LYMPH NODES #2 AND #3, RIGHT AXILLA; EXCISIONAL BIOPSY:  - METASTATIC CARCINOMA INVOLVING TWO OF TWO LYMPH NODES (2/2).  - SEE CANCER SUMMARY BELOW.   pT2 pN2a  ER 90%, PR 90% HER2 negative.    INTERVAL  HISTORY Kylie Saunders is a 60 y.o. female who has above history reviewed by me today presents for follow up visit for breast cancer.  Patient presents to discussed pathology and management. POA Saralyn Pilar was called during this encounter.  During interval, I have discussed with patient's POA Patrick and he declined additional surgery for axillary lymph node dissection and chemotherapy.  PET scan was obtained for staging. Patient was also admitted due to seroma infection.  A drain was placed and the patient has follow-up appointment with Dr. Christian Mate today. She has finished a course of antibiotics. Today she has no new complaints.  Accompanied by group home staff.                Review of Systems  Unable to perform ROS: Other (Schizophrenia and intellectual disability.)  Constitutional:  Negative for fatigue.  Respiratory:  Negative for cough and shortness of breath.   Gastrointestinal:  Negative for abdominal distention and abdominal pain.  Neurological:  Negative for headaches and light-headedness.  Hematological:  Negative for adenopathy.  Psychiatric/Behavioral:  Negative for confusion.   Right breast seroma catheter. MEDICAL HISTORY:  Past Medical History:  Diagnosis Date   Anxiety    Cancer of body of uterus ([place]) 05/31/2012   Endometrial cancer, had hysterectomy w/BSO 07/25/12   GERD (gastroesophageal reflux disease)    History of 2019 novel coronavirus disease (COVID-19) 12/14/2020   HLD (hyperlipidemia) 07/25/2012   Hypertension    Intellectual disability 07/25/2012   Obesity    Pancreatitis due to biliary obstruction    Paranoid schizophrenia ([place])    Right eye injury 11/03/2016    SURGICAL HISTORY: Past  Surgical History:  Procedure Laterality Date   ABDOMINAL HYSTERECTOMY  07/25/2012   with BSO   AXILLARY LYMPH NODE BIOPSY Right 07/27/2021   Procedure: AXILLARY LYMPH NODE BIOPSY;  Surgeon: Ronny Bacon, MD;  Location: [facility] OR[facility];  Service: General;  Laterality: Right;    BREAST BIOPSY Right 07/07/2021   [place] bx mass, venus marker, path pending   CHOLECYSTECTOMY N/A 01/24/2017   Procedure: LAPAROSCOPIC CHOLECYSTECTOMY WITH INTRAOPERATIVE CHOLANGIOGRAM;  Surgeon: Florene Glen, MD;  Location: [facility];  Service: General;  Laterality: N/A;   EYE SURGERY     INCISION AND DRAINAGE ABSCESS Right 08/17/2021   Procedure: INCISION AND DRAINAGE ABSCESS;  Surgeon: Ronny Bacon, MD;  Location: [facility];  Service: General;  Laterality: Right;   SIMPLE MASTECTOMY WITH AXILLARY SENTINEL NODE BIOPSY Right 07/27/2021   Procedure: SIMPLE MASTECTOMY;  Surgeon: Ronny Bacon, MD;  Location: [facility];  Service: General;  Laterality: Right;   TONSILLECTOMY      SOCIAL HISTORY: Social History   Socioeconomic History   Marital status: Single    Spouse name: Not on file   Number of children: Not on file   Years of education: Not on file   Highest education level: Not on file  Occupational History   Not on file  Tobacco Use   Smoking status: Never   Smokeless tobacco: Never  Vaping Use   Vaping Use: Never used  Substance and Sexual Activity   Alcohol use: No    Alcohol/week: 0.0 standard drinks   Drug use: No   Sexual activity: Not Currently  Other Topics Concern   Not on file  Social History Narrative   Not on file   Social Determinants of Health   Financial Resource Strain: Not on file  Food Insecurity: Not on file  Transportation Needs: Not on file  Physical Activity: Not on file  Stress: Not on file  Social Connections: Not on file  Intimate Partner Violence: Not on file    FAMILY HISTORY: Family History  Problem Relation Age of Onset   Diabetes Neg Hx    Hypertension Neg Hx     ALLERGIES:  has No Known Allergies.  MEDICATIONS:  Current Outpatient Medications  Medication Sig Dispense Refill   amLODipine (NORVASC) 5 MG tablet Take 5 mg by mouth daily.     Ascorbic Acid (VITAMIN C) 100 MG tablet Take by mouth daily.     aspirin 81 MG  chewable tablet CHEW ONE TABLET BY MOUTH EVERY DAY     benztropine (COGENTIN) 2 MG tablet Take 1 tablet (2 mg total) by mouth daily. 30 tablet 2   calcium carbonate (OS-CAL) 600 MG tablet Take 2 tablets (1,200 mg total) by mouth daily. 60 tablet 3   loratadine (CLARITIN) 10 MG tablet Take 10 mg by mouth daily.     LORazepam (ATIVAN) 0.5 MG tablet Take 1 tablet (0.5 mg total) by mouth 2 (two) times daily. 60 tablet 2   omeprazole (PRILOSEC) 40 MG capsule Take 40 mg by mouth daily.     Polyvinyl Alcohol-Povidone (REFRESH OP) Apply 1 application to eye 2 (two) times daily. Right eye only     risperiDONE (RISPERDAL) 2 MG tablet Take 1 tablet (2 mg total) by mouth 2 (two) times daily. 60 tablet 2   simvastatin (ZOCOR) 40 MG tablet Take 40 mg by mouth daily.      sulfamethoxazole-trimethoprim (BACTRIM DS) 800-160 MG tablet Take 1 tablet by mouth 2 (two) times daily for 7 days. 14 tablet  0   zinc gluconate 50 MG tablet Take by mouth daily.     abemaciclib (VERZENIO) 150 MG tablet Take 1 tablet (150 mg total) by mouth 2 (two) times daily. Swallow tablets whole. Do not chew, crush, or split tablets before swallowing. 56 tablet 1   No current facility-administered medications for this visit.     PHYSICAL EXAMINATION: ECOG PERFORMANCE STATUS: 1 - Symptomatic but completely ambulatory Vitals:   09/01/21 1108  BP: 112/77  Pulse: 82  Resp: 16  Temp: 98.3 F (36.8 C)  SpO2: 97%   Filed Weights   09/01/21 1108  Weight: 175 lb 9.6 oz (79.7 kg)    Physical Exam Constitutional:      General: She is not in acute distress. HENT:     Head: Normocephalic and atraumatic.  Eyes:     General: No scleral icterus. Cardiovascular:     Rate and Rhythm: Normal rate and regular rhythm.     Heart sounds: Normal heart sounds.  Pulmonary:     Effort: Pulmonary effort is normal. No respiratory distress.     Breath sounds: No wheezing.  Abdominal:     General: Bowel sounds are normal. There is no  distension.     Palpations: Abdomen is soft.  Musculoskeletal:        General: No deformity. Normal range of motion.     Cervical back: Normal range of motion and neck supple.  Skin:    General: Skin is warm and dry.     Findings: No erythema or rash.  Neurological:     Mental Status: She is alert and oriented to person, place, and time. Mental status is at baseline.     Cranial Nerves: No cranial nerve deficit.     Coordination: Coordination normal.   LABORATORY DATA:  I have reviewed the data as listed Lab Results  Component Value Date   WBC 7.0 09/01/2021   HGB 10.8 (L) 09/01/2021   HCT 35.2 (L) 09/01/2021   MCV 93.9 09/01/2021   PLT 308 09/01/2021   Recent Labs    07/28/21 1017 07/29/21 0915 08/16/21 1351 08/17/21 0528 08/19/21 0640 08/20/21 0523 09/01/21 1053  NA 134*   < > 133*   < > 139 140 135  K 4.8   < > 3.4*   < > 3.7 3.3* 4.3  CL 94*   < > 96*   < > 104 104 96*  CO2 28   < > 26   < > _0 GLUCOSE 159*   < > 138*   < > 123* 126* 102*  BUN 20   < > 44*   < > 29* 25* 12  CREATININE 1.45*   < > 2.53*   < > 1.06* 0.96 0.99  CALCIUM 8.4*   < > 8.1*   < > 7.9* 7.9* 8.8*  GFRNONAA 41*   < > 21*   < > 59* >60 >60  PROT 7.3  --  6.6  --   --   --  6.9  ALBUMIN 3.8  --  2.8*  --   --   --  3.4*  AST 28  --  10*  --   --   --  12*  ALT 37  --  12  --   --   --  13  ALKPHOS 68  --  59  --   --   --  66  BILITOT 0.5  --  0.8  --   --   --  0.4   < > = values in this interval not displayed.    Iron/TIBC/Ferritin/ %Sat No results found for: IRON, TIBC, FERRITIN, IRONPCTSAT    RADIOGRAPHIC STUDIES: I have personally reviewed the radiological images as listed and agreed with the findings in the report. DG Chest 2 View  Result Date: 08/16/2021 CLINICAL DATA:  Fever, recent right mastectomy EXAM: CHEST - 2 VIEW COMPARISON:  07/28/2021 FINDINGS: Cardiomegaly. Trace pleural effusions. Disc degenerative disease of the thoracic spine. IMPRESSION: Cardiomegaly with  trace pleural effusions. Electronically Signed   By: Eddie Candle M.D.   On: 08/16/2021 13:53   CT Chest Wo Contrast  Result Date: 08/16/2021 CLINICAL DATA:  60 year old female with history of mastectomy last week. Clear drainage and redness around the incision site. EXAM: CT CHEST WITHOUT CONTRAST TECHNIQUE: Multidetector CT imaging of the chest was performed following the standard protocol without IV contrast. COMPARISON:  Chest CTA 07/28/2021. FINDINGS: Cardiovascular: Heart size is normal. There is no significant pericardial fluid, thickening or pericardial calcification. There is aortic atherosclerosis, as well as atherosclerosis of the great vessels of the mediastinum and the coronary arteries, including calcified atherosclerotic plaque in the left anterior descending, left circumflex and right coronary arteries. Mediastinum/Nodes: No pathologically enlarged mediastinal or hilar lymph nodes. Please note that accurate exclusion of hilar adenopathy is limited on noncontrast CT scans. Esophagus is unremarkable in appearance. No axillary lymphadenopathy. Lungs/Pleura: No acute consolidative airspace disease. No pleural effusions. No suspicious appearing pulmonary nodules or masses are noted. Mild chronic scarring in the lung bases bilaterally. Upper Abdomen: Unremarkable. Musculoskeletal: Postoperative changes of recent right-sided modified radical mastectomy are noted. There is a large postoperative fluid collection in the mastectomy bed measuring approximately 11.4 x 5.2 x 13.9 cm (axial image 71 of series 2 and sagittal image 24 of series 6). Along the medial margin of this there is also some soft tissue gas (axial image 78 of series 2) in the subcutaneous fat of the medial aspect of the mastectomy site. Possible communication with the skin is noted on axial image 64 of series 2. There are no aggressive appearing lytic or blastic lesions noted in the visualized portions of the skeleton. IMPRESSION: 1.  Postoperative changes of recent modified radical mastectomy with postoperative fluid and gas in the right chest wall, as detailed above. 2. Aortic atherosclerosis, in addition to 3 vessel coronary artery disease. Please note that although the presence of coronary artery calcium documents the presence of coronary artery disease, the severity of this disease and any potential stenosis cannot be assessed on this non-gated CT examination. Assessment for potential risk factor modification, dietary therapy or pharmacologic therapy may be warranted, if clinically indicated. Electronically Signed   By: Vinnie Langton M.D.   On: 08/16/2021 20:38   NM PET Image Initial (PI) Skull Base To Thigh  Result Date: 08/31/2021 CLINICAL DATA:  Initial treatment strategy for right breast cancer. EXAM: NUCLEAR MEDICINE PET SKULL BASE TO THIGH TECHNIQUE: 9.2 mCi F-18 FDG was injected intravenously. Full-ring PET imaging was performed from the skull base to thigh after the radiotracer. CT data was obtained and used for attenuation correction and anatomic localization. Fasting blood glucose: 111 mg/dl COMPARISON:  Chest CT 08/16/2021.  Abdomen/pelvis CT 12/16/2016. FINDINGS: Mediastinal blood pool activity: SUV max 2.1 Liver activity: SUV max NA NECK: No hypermetabolic lymph nodes in the neck. Incidental CT findings: none CHEST: Diffuse FDG accumulation is identified in the anterior right chest wall, associated with the surgical drain in the operative site. No  hypermetabolic mediastinal, hilar, or internal mammary lymphadenopathy. No hypermetabolic axillary lymphadenopathy. Low level FDG uptake identified in small right subpectoral nodes, indeterminate given the postoperative findings in the right anterior chest wall. Incidental CT findings: There is mild atherosclerotic calcification of the abdominal aorta without aneurysm. Coronary artery calcification is evident. ABDOMEN/PELVIS: No abnormal hypermetabolic activity within the  liver, pancreas, adrenal glands, or spleen. No hypermetabolic lymph nodes in the abdomen or pelvis. Incidental CT findings: Small supraumbilical midline ventral hernia contains only fat. SKELETON: No focal hypermetabolic activity to suggest skeletal metastasis. Incidental CT findings: No worrisome lytic or sclerotic osseous abnormality. Degenerative changes noted both hips and in the shoulders, right greater than left. IMPRESSION: 1. No evidence for hypermetabolic metastatic disease in the neck, chest, abdomen, or pelvis. 2. Hypermetabolism identified in the anterior right chest wall, at the location of the collection of fluid and gas seen on the 08/16/2021 chest CT, likely related to inflammatory etiology. Electronically Signed   By: Misty Stanley M.D.   On: 08/31/2021 13:07      ASSESSMENT & PLAN:  1. Malignant neoplasm of overlapping sites of right breast in female, estrogen receptor positive ([place])   2. Seroma of breast   3. Goals of care, counseling/discussion   4. Normocytic anemia   Cancer Staging Malignant neoplasm of overlapping sites of right breast in female, estrogen receptor positive ([place]) Staging form: Breast, AJCC 8th Edition - Pathologic stage from 08/03/2021: Stage IB (pT2, pN2a, cM0, G2, ER+, PR+, HER2-) - Signed by Earlie Server, MD on 08/03/2021    Right invasive breast carcinoma, ER+. PR+, HER2-   S/p mastectomy and SLNB pT2 pN2 Patient/POA declined additional axillary lymph node dissection and chemotherapy. They are interested in adjuvant endocrine therapy. 08/30/2021 PET was reviewed, no metastatic disease Patient has a history of hysterectomy.  FSH and estradiol were checked to confirm menopause state. If she is postmenopausal, Arimidex. If perimenopausal, plan tamoxifen. Rationale and potential side effects of both Arimidex and tamoxifen were discussed with patient. Future plan also include adding abemaciclib. Currently will hold starting on that until she is fully  recovered from seroma infection.  Seroma, follow-up with surgery. Normocytic anemia, improving. Hb 10.8   All questions were answered. The patient knows to call the clinic with any problems questions or concerns.  cc Novella Rob, FNP    Return of visit: 3 weeks.    Earlie Server, MD, PhD 09/01/2021

## 2021-09-02 LAB — FOLLICLE STIMULATING HORMONE: FSH: 15.2 m[IU]/mL

## 2021-09-02 LAB — ESTRADIOL: Estradiol: 20.6 pg/mL

## 2021-09-02 NOTE — Progress Notes (Signed)
[facility] SURGICAL ASSOCIATES POST-OP OFFICE VISIT  09/02/2021  HPI: Kylie Saunders is a 60 y.o. female 5 weeks s/p right mastectomy with sentinel lymph node biopsy.  Postoperative course complicated by early withdrawal/inadvertent removal of drain.  Subsequent infected seroma with replacement of drain.  She currently continues to have about 50 mL of drain output daily.  She has no evidence of infection.  No fevers no chills.  Vital signs: BP 114/74   Pulse 90   Temp 98.9 F (37.2 C) (Oral)   Ht 4\' 11"  (1.499 m)   Wt 177 lb (80.3 kg)   SpO2 92%   BMI 35.75 kg/m    Physical Exam: Constitutional: She appears well  Skin: Right chest wall is clean dry and intact.  The incision is clean, there is no erythema, no evidence of seroma or abscess.  Drain site appears clean, serous fluid within the tubing.  No evidence of any fluid retention.  Assessment/Plan: This is a 60 y.o. female 5 weeks s/p right mastectomy.  Patient Active Problem List   Diagnosis Date Noted   Goals of care, counseling/discussion 09/01/2021   Seroma of breast 09/01/2021   MRSA (methicillin resistant Staphylococcus aureus) infection 08/25/2021   Acute respiratory failure with hypoxia and hypercapnia ([place]) 08/18/2021   Hyponatremia 43/15/4008   Acute metabolic encephalopathy 67/61/9509   Normocytic anemia 08/18/2021   Cellulitis, wound, post-operative 08/17/2021   Hypokalemia 08/17/2021   Essential hypertension 08/17/2021   AKI (acute kidney injury) ([place]) 08/16/2021   Malignant neoplasm of right female breast ([place]) 08/14/2021   PNA (pneumonia) 07/28/2021   Sepsis ([place]) 07/28/2021   Aspiration pneumonia ([place]) 07/28/2021   Septic shock ([place]) 07/28/2021   Malignant neoplasm of overlapping sites of right breast in female, estrogen receptor positive ([place]) 32/67/1245   Biliary colic 80/99/8338   Ruptured globe of right eye 12/22/2016   Left upper quadrant pain    Acute pancreatitis    Abdominal pain 12/16/2016    Schizophrenia, paranoid ([place]) 11/03/2016   Right eye injury 11/03/2016   HLD (hyperlipidemia) 07/25/2012   Intellectual disability 07/25/2012   Cancer of body of uterus ([place]) 05/31/2012   Malignant neoplasm of body of uterus ([place]) 05/31/2012    -As for now I believe we will need to continue this drain another week.  Hopefully we can remove it next Thursday.  Discussed with caregiver.   Ronny Bacon M.D., FACS 09/02/2021, 12:52 PM

## 2021-09-04 ENCOUNTER — Other Ambulatory Visit: Payer: Self-pay | Admitting: Oncology

## 2021-09-04 MED ORDER — TAMOXIFEN CITRATE 20 MG PO TABS
20.0000 mg | ORAL_TABLET | Freq: Every day | ORAL | 0 refills | Status: AC
Start: 2021-09-04 — End: ?

## 2021-09-05 ENCOUNTER — Telehealth: Payer: Self-pay

## 2021-09-05 ENCOUNTER — Telehealth: Payer: Self-pay | Admitting: Pharmacy Technician

## 2021-09-05 NOTE — Telephone Encounter (Signed)
-----   Message from Earlie Server, MD sent at 09/04/2021 12:06 AM EDT ----- Please let patient/group home know that I will send her Rx of Tamoxifen to start next week. Thanks. Keep same follow up appt.

## 2021-09-05 NOTE — Telephone Encounter (Signed)
Contacted group home and spoke to Skeet Simmer, notified her of MD recommendation regarding Tamoxifen. She requested we fax a notice to [facility] to give them a start date for prescription. Fax sent to [facility] for pt to start Tamoxifen on 09/12/2021.

## 2021-09-05 NOTE — Telephone Encounter (Signed)
Oral Oncology Patient Advocate Floria Raveling met with patient and caregiver at appt on 12/90/47 to complete application for [facility] [employer] in an effort to reduce patient's out of pocket expense for Verzenio to $0.    Application completed and faxed to [phone].   [employer] patient assistance phone number for follow up is [phone].   This encounter will be updated until final determination.   [place] Patient [place] Phone [phone] Fax [phone] 09/05/2021 11:05 AM

## 2021-09-07 NOTE — Telephone Encounter (Signed)
Oral Oncology Patient Advocate Encounter  Received notification from [facility] Patient Assistance program that patient has been successfully enrolled into their program to receive Verzenio from the manufacturer at $0 out of pocket until 11/19/21.    I called and spoke with patients caregiver.  She knows we will have to re-apply.   Specialty Pharmacy that will dispense medication is [facility].  Patient knows to call the office with questions or concerns.   Oral Oncology Clinic will continue to follow.  [place] Patient [place] Phone [phone] Fax [phone] 09/07/2021 4:18 PM

## 2021-09-08 ENCOUNTER — Encounter: Payer: Self-pay | Admitting: Surgery

## 2021-09-08 ENCOUNTER — Ambulatory Visit (INDEPENDENT_AMBULATORY_CARE_PROVIDER_SITE_OTHER): Payer: Medicare HMO | Admitting: Surgery

## 2021-09-08 ENCOUNTER — Other Ambulatory Visit: Payer: Self-pay

## 2021-09-08 VITALS — BP 135/82 | HR 90 | Temp 98.7°F | Ht 59.0 in | Wt 177.6 lb

## 2021-09-08 DIAGNOSIS — Z09 Encounter for follow-up examination after completed treatment for conditions other than malignant neoplasm: Secondary | ICD-10-CM

## 2021-09-08 DIAGNOSIS — Z9011 Acquired absence of right breast and nipple: Secondary | ICD-10-CM

## 2021-09-08 NOTE — Patient Instructions (Signed)
We have removed your drain today. You will need to keep a dry gauze dressing over the are and change once a day or as needed until it stops draining and closes up.  Follow up here in 2 weeks.

## 2021-09-08 NOTE — Progress Notes (Signed)
Amana presents today with her right postmastectomy drain in place.  She denies any fevers and chills.  Caregivers confirmed this.  Drain output has diminished to about 20 mL/day.   On exam, she appears to have no retained fluid, no evidence of inflammatory changes.  Her skin is slightly pink in this area. Appears irritated around the drain exit site.  I proceeded with milking the drain, confirming there is no residual fluid.  I remove the drain and placed a dry sterile dressing. I gave him instructions to change that dressing at least daily and as needed until there is no further drainage. We will have her back in 2 weeks in follow-up.

## 2021-09-08 NOTE — Progress Notes (Deleted)
[facility] MD/PA/NP OP Progress Note  09/08/2021 10:37 AM Kylie Saunders  MRN:  [id number]  Chief Complaint:  HPI:  - she underwent mastectomy - She was admitted for AMS in the context of sepsis, AKI secondary to pneumonia  Visit Diagnosis: No diagnosis found.  Past Psychiatric History: Please see initial evaluation for full details. I have reviewed the history. No updates at this time.     Past Medical History:  Past Medical History:  Diagnosis Date   Anxiety    Cancer of body of uterus ([place]) 05/31/2012   Endometrial cancer, had hysterectomy w/BSO 07/25/12   GERD (gastroesophageal reflux disease)    History of 2019 novel coronavirus disease (COVID-19) 12/14/2020   HLD (hyperlipidemia) 07/25/2012   Hypertension    Intellectual disability 07/25/2012   Obesity    Pancreatitis due to biliary obstruction    Paranoid schizophrenia ([place])    Right eye injury 11/03/2016    Past Surgical History:  Procedure Laterality Date   ABDOMINAL HYSTERECTOMY  07/25/2012   with BSO   AXILLARY LYMPH NODE BIOPSY Right 07/27/2021   Procedure: AXILLARY LYMPH NODE BIOPSY;  Surgeon: Ronny Bacon, MD;  Location: [facility];  Service: General;  Laterality: Right;   BREAST BIOPSY Right 07/07/2021   [place] bx mass, venus marker, path pending   CHOLECYSTECTOMY N/A 01/24/2017   Procedure: LAPAROSCOPIC CHOLECYSTECTOMY WITH INTRAOPERATIVE CHOLANGIOGRAM;  Surgeon: Florene Glen, MD;  Location: [facility];  Service: General;  Laterality: N/A;   EYE SURGERY     INCISION AND DRAINAGE ABSCESS Right 08/17/2021   Procedure: INCISION AND DRAINAGE ABSCESS;  Surgeon: Ronny Bacon, MD;  Location: [facility];  Service: General;  Laterality: Right;   SIMPLE MASTECTOMY WITH AXILLARY SENTINEL NODE BIOPSY Right 07/27/2021   Procedure: SIMPLE MASTECTOMY;  Surgeon: Ronny Bacon, MD;  Location: [facility];  Service: General;  Laterality: Right;   TONSILLECTOMY      Family Psychiatric History: Please see initial evaluation for full  details. I have reviewed the history. No updates at this time.     Family History:  Family History  Problem Relation Age of Onset   Diabetes Neg Hx    Hypertension Neg Hx     Social History:  Social History   Socioeconomic History   Marital status: Single    Spouse name: Not on file   Number of children: Not on file   Years of education: Not on file   Highest education level: Not on file  Occupational History   Not on file  Tobacco Use   Smoking status: Never   Smokeless tobacco: Never  Vaping Use   Vaping Use: Never used  Substance and Sexual Activity   Alcohol use: No    Alcohol/week: 0.0 standard drinks   Drug use: No   Sexual activity: Not Currently  Other Topics Concern   Not on file  Social History Narrative   Not on file   Social Determinants of Health   Financial Resource Strain: Not on file  Food Insecurity: Not on file  Transportation Needs: Not on file  Physical Activity: Not on file  Stress: Not on file  Social Connections: Not on file    Allergies: No Known Allergies  Metabolic Disorder Labs: No results found for: HGBA1C, MPG No results found for: PROLACTIN Lab Results  Component Value Date   CHOL 165 06/05/2016   TRIG 192 (H) 06/05/2016   HDL 39 (L) 06/05/2016   LDLCALC 88 06/05/2016   Lab Results  Component Value Date  TSH 2.610 06/05/2016    Therapeutic Level Labs: No results found for: LITHIUM No results found for: VALPROATE No components found for:  CBMZ  Current Medications: Current Outpatient Medications  Medication Sig Dispense Refill   abemaciclib (VERZENIO) 150 MG tablet Take 1 tablet (150 mg total) by mouth 2 (two) times daily. Swallow tablets whole. Do not chew, crush, or split tablets before swallowing. 56 tablet 1   amLODipine (NORVASC) 5 MG tablet Take 5 mg by mouth daily.     Ascorbic Acid (VITAMIN C) 100 MG tablet Take by mouth daily.     aspirin 81 MG chewable tablet CHEW ONE TABLET BY MOUTH EVERY DAY      benztropine (COGENTIN) 2 MG tablet Take 1 tablet (2 mg total) by mouth daily. 30 tablet 2   calcium carbonate (OS-CAL) 600 MG tablet Take 2 tablets (1,200 mg total) by mouth daily. 60 tablet 3   loratadine (CLARITIN) 10 MG tablet Take 10 mg by mouth daily.     LORazepam (ATIVAN) 0.5 MG tablet Take 1 tablet (0.5 mg total) by mouth 2 (two) times daily. 60 tablet 2   omeprazole (PRILOSEC) 40 MG capsule Take 40 mg by mouth daily.     Polyvinyl Alcohol-Povidone (REFRESH OP) Apply 1 application to eye 2 (two) times daily. Right eye only     risperiDONE (RISPERDAL) 2 MG tablet Take 1 tablet (2 mg total) by mouth 2 (two) times daily. 60 tablet 2   simvastatin (ZOCOR) 40 MG tablet Take 40 mg by mouth daily.      tamoxifen (NOLVADEX) 20 MG tablet Take 1 tablet (20 mg total) by mouth daily. 30 tablet 0   zinc gluconate 50 MG tablet Take by mouth daily.     No current facility-administered medications for this visit.     Musculoskeletal: Strength & Muscle Tone:  N?A Gait & Station:  N/A Patient leans: N/A  Psychiatric Specialty Exam: Review of Systems  There were no vitals taken for this visit.There is no height or weight on file to calculate BMI.  General Appearance: {Appearance:22683}  Eye Contact:  {BHH EYE CONTACT:22684}  Speech:  Clear and Coherent  Volume:  Normal  Mood:  {BHH MOOD:22306}  Affect:  {Affect (PAA):22687}  Thought Process:  Coherent  Orientation:  Full (Time, Place, and Person)  Thought Content: Logical   Suicidal Thoughts:  {ST/HT (PAA):22692}  Homicidal Thoughts:  {ST/HT (PAA):22692}  Memory:  Immediate;   Good  Judgement:  {Judgement (PAA):22694}  Insight:  {Insight (PAA):22695}  Psychomotor Activity:  Normal  Concentration:  Concentration: Good and Attention Span: Good  Recall:  Good  Fund of Knowledge: Good  Language: Good  Akathisia:  No  Handed:  Right  AIMS (if indicated): not done  Assets:  Communication Skills Desire for Improvement  ADL's:  Intact   Cognition: WNL  Sleep:  {BHH GOOD/FAIR/POOR:22877}   Screenings: PHQ2-9    Flowsheet Row Video Visit from 06/16/2021 in [facility]  PHQ-2 Total Score 0      Flowsheet Row ED to Hosp-Admission (Discharged) from 08/16/2021 in [facility] (1C) ED to Hosp-Admission (Discharged) from 07/28/2021 in [facility] MED PCU Video Visit from 06/16/2021 in [facility] No Risk No Risk No Risk        Assessment and Plan:  Kylie Saunders is a 60 y.o. year old female with a history of Paranoid schizophrenia , intellectual disability , uterine cancer, hypertension, hyperlipidemia, history  of pancreatitis due to biliary obstruction , who presents for follow up appointment for below.     1. Schizophrenia, paranoid ([place]) 2. Intellectual disability She denies any psychotic symptoms, and has no known behavior issues.  Will continue current dose of Risperdal to target schizophrenia.  Will continue Risperdal for EPS with plan to taper it off if able in the future.  Will continue clonazepam as needed for anxiety/irritability.  Discussed potential risk of metabolic side effect and EPS.  The staff is advised to send PCP record for collateral.    Plan Continue Risperidone 2 mg twice a day Continue benztropine 2 mg daily Continue clonazepam 0.5 mg twice a day Next appointment- 10/24 at 9:30 for 30 mins, in person - She sees Dr. Maryjane Hurter, PCP     The patient demonstrates the following risk factors for suicide: Chronic risk factors for suicide include: psychiatric disorder of intellectual disability . Acute risk factors for suicide include: N/A. Protective factors for this patient include: positive social support and hope for the future. Considering these factors, the overall suicide risk at this point appears to be low. Patient is appropriate for outpatient follow up.      Norman Clay, MD 09/08/2021, 10:37 AM

## 2021-09-09 ENCOUNTER — Encounter: Payer: Self-pay | Admitting: Surgery

## 2021-09-12 ENCOUNTER — Ambulatory Visit: Payer: Medicare HMO | Admitting: Psychiatry

## 2021-09-13 ENCOUNTER — Telehealth: Payer: Self-pay | Admitting: Psychiatry

## 2021-09-13 ENCOUNTER — Encounter: Payer: Self-pay | Admitting: Psychiatry

## 2021-09-13 NOTE — Telephone Encounter (Signed)
This encounter was created in error - please disregard.

## 2021-09-13 NOTE — Telephone Encounter (Deleted)
Late entry. Called the patient twice for appointment scheduled today. The patient did not answer the phone for the visit.

## 2021-09-13 NOTE — Addendum Note (Signed)
Addended by: Norman Clay on: 09/13/2021 05:04 PM   Modules accepted: Orders, Level of Service, SmartSet

## 2021-09-14 ENCOUNTER — Ambulatory Visit (INDEPENDENT_AMBULATORY_CARE_PROVIDER_SITE_OTHER): Payer: Medicare HMO | Admitting: Physician Assistant

## 2021-09-14 ENCOUNTER — Telehealth: Payer: Self-pay | Admitting: Pharmacist

## 2021-09-14 ENCOUNTER — Encounter: Payer: Self-pay | Admitting: Physician Assistant

## 2021-09-14 ENCOUNTER — Other Ambulatory Visit: Payer: Self-pay

## 2021-09-14 VITALS — BP 119/71 | HR 104 | Temp 99.2°F | Ht 59.0 in | Wt 178.0 lb

## 2021-09-14 DIAGNOSIS — Z9011 Acquired absence of right breast and nipple: Secondary | ICD-10-CM

## 2021-09-14 DIAGNOSIS — Z09 Encounter for follow-up examination after completed treatment for conditions other than malignant neoplasm: Secondary | ICD-10-CM

## 2021-09-14 DIAGNOSIS — A4902 Methicillin resistant Staphylococcus aureus infection, unspecified site: Secondary | ICD-10-CM

## 2021-09-14 MED ORDER — SULFAMETHOXAZOLE-TRIMETHOPRIM 800-160 MG PO TABS: 1.0000 | ORAL_TABLET | Freq: Two times a day (BID) | ORAL | 0 refills | Status: AC

## 2021-09-14 NOTE — Progress Notes (Signed)
[facility] SURGICAL ASSOCIATES POST-OP OFFICE VISIT  09/14/2021  HPI: Kylie Saunders is a 60 y.o. female s/p initial right mastectomy on 09/07 with Dr Christian Mate. Post-operative course was initially complicated by PNA requiring admission. Unfortunately, she did need re-excision and placement of drain on 09/28 with Dr Christian Mate as well. Drain was removed on 10/20.   In the last 24-48 hours, she has been found to have increasing redness and warmth of the remaining right breast tissue around the incision. This does not appear to be tender. She does not have any fevers. She does have previous Cx from her last procedure which grew MRSA. She was previously given bactrim.   Vital signs: BP 119/71   Pulse (!) 104   Temp 99.2 F (37.3 C)   Ht 4\' 11"  (1.499 m)   Wt 178 lb (80.7 kg)   SpO2 93%   BMI 35.95 kg/m    Physical Exam: Constitutional: Well appearing female, NAD Skin: Chaperone present, she has blanchable erythema surrounding her right mastectomy scar, this does not appear painful, there does not appear to be any abscess or evidence of necrosis at this time. The incision itself is well healed  Assessment/Plan: This is a 60 y.o. female with what appears to be cellulitis of the right breast   - I will start her again on Bactrim BID x10 for cellulitis  - Reviewed signs and symptoms of worsening infection with her caregiver  - She will follow up in 1 week with Dr Christian Mate for re-check   -- Edison Simon, PA-C [facility] 09/14/2021, 3:52 PM [phone] M-F: 7am - 4pm

## 2021-09-14 NOTE — Telephone Encounter (Signed)
Oral Chemotherapy Pharmacist Encounter   Received a call from group home caregiver Jaclyn Prime stating that the Verzenio from manufacturer assistance had been delivered. She was asking for an order to start giving the medication to Ms. Lightcap. The plan is to hold on starting the Verzenio until she is seen in clinic next week and given the go ahead to get started. If Dr. Berniece Pap her to get started on the [facility], an order to administration will be given at that time. Jaclyn Prime stated her understanding of the plan.  Of note, Jaclyn Prime mentioned that Ms. Demchak's drain was removed but the site where the drain was located is red and she thinks it might be infected. She called [facility] and Ms. Moor is scheduled to be seen there today.   Darl Pikes, PharmD, BCPS, BCOP, CPP Hematology/Oncology Clinical Pharmacist [facility]/DB/AP [facility] [phone]  09/14/2021 12:54 PM

## 2021-09-14 NOTE — Patient Instructions (Signed)
We will send you in a prescription for antibiotics. Start these and follow up next week as scheduled.  Call us if the area worsens or you develop a fever of 101 or higher.

## 2021-09-19 ENCOUNTER — Other Ambulatory Visit: Payer: Self-pay | Admitting: *Deleted

## 2021-09-22 ENCOUNTER — Inpatient Hospital Stay: Payer: Medicare HMO | Attending: Oncology | Admitting: Oncology

## 2021-09-22 ENCOUNTER — Encounter: Payer: Self-pay | Admitting: Oncology

## 2021-09-22 ENCOUNTER — Ambulatory Visit (INDEPENDENT_AMBULATORY_CARE_PROVIDER_SITE_OTHER): Payer: Medicare HMO | Admitting: Surgery

## 2021-09-22 ENCOUNTER — Other Ambulatory Visit: Payer: Self-pay

## 2021-09-22 ENCOUNTER — Inpatient Hospital Stay: Payer: Medicare HMO | Admitting: Pharmacist

## 2021-09-22 ENCOUNTER — Encounter: Payer: Self-pay | Admitting: Surgery

## 2021-09-22 VITALS — BP 117/75 | HR 89 | Temp 98.5°F | Resp 18 | Wt 177.4 lb

## 2021-09-22 VITALS — BP 108/76 | HR 92 | Temp 98.4°F | Ht 59.0 in | Wt 175.0 lb

## 2021-09-22 DIAGNOSIS — Z9011 Acquired absence of right breast and nipple: Secondary | ICD-10-CM | POA: Diagnosis not present

## 2021-09-22 DIAGNOSIS — Z8542 Personal history of malignant neoplasm of other parts of uterus: Secondary | ICD-10-CM | POA: Diagnosis not present

## 2021-09-22 DIAGNOSIS — Z90722 Acquired absence of ovaries, bilateral: Secondary | ICD-10-CM | POA: Insufficient documentation

## 2021-09-22 DIAGNOSIS — Z79810 Long term (current) use of selective estrogen receptor modulators (SERMs): Secondary | ICD-10-CM | POA: Insufficient documentation

## 2021-09-22 DIAGNOSIS — Z170 Estrogen receptor positive status [ER+]: Secondary | ICD-10-CM | POA: Diagnosis not present

## 2021-09-22 DIAGNOSIS — N6489 Other specified disorders of breast: Secondary | ICD-10-CM | POA: Insufficient documentation

## 2021-09-22 DIAGNOSIS — C50811 Malignant neoplasm of overlapping sites of right female breast: Secondary | ICD-10-CM | POA: Insufficient documentation

## 2021-09-22 DIAGNOSIS — F200 Paranoid schizophrenia: Secondary | ICD-10-CM | POA: Insufficient documentation

## 2021-09-22 DIAGNOSIS — F79 Unspecified intellectual disabilities: Secondary | ICD-10-CM | POA: Diagnosis not present

## 2021-09-22 DIAGNOSIS — Z7982 Long term (current) use of aspirin: Secondary | ICD-10-CM | POA: Diagnosis not present

## 2021-09-22 DIAGNOSIS — Z90710 Acquired absence of both cervix and uterus: Secondary | ICD-10-CM | POA: Diagnosis not present

## 2021-09-22 DIAGNOSIS — Z79899 Other long term (current) drug therapy: Secondary | ICD-10-CM | POA: Insufficient documentation

## 2021-09-22 DIAGNOSIS — A4902 Methicillin resistant Staphylococcus aureus infection, unspecified site: Secondary | ICD-10-CM

## 2021-09-22 DIAGNOSIS — Z09 Encounter for follow-up examination after completed treatment for conditions other than malignant neoplasm: Secondary | ICD-10-CM

## 2021-09-22 NOTE — Progress Notes (Signed)
Shaya Joshi was seen in my absence by Thedore Mins, and started on antibiotics for cellulitis in the right mastectomy area. She has done well with a good response with diminished erythematous changes, no pain and no fevers or chills. She does have a small skin overlapping area that appears to be slow to heal and this may be the source of entry of this infection.  I probed it with silver nitrate, and will start dressing this on a daily basis.  I utilized the ultrasound to evaluate for any retained fluid under her incision as her right chest wall is quite irregular now due to folding and scarring of the soft tissues.  I did find a small area consistent with some retained fluid, and under ultrasound guidance I proceeded with aspiration.  She tolerated this quite well, and obtained approximately 4 to 5 mL of very clear serous fluid.  I did not send this for culture as this clearly does not appear to be a source of infection or the infection.  She will continue her antibiotics to completion this coming weekend.  Hopefully she will not have recurrence of this cellulitic change.  And we will plan on seeing her back in a 2 to 3 weeks or as needed.

## 2021-09-22 NOTE — Progress Notes (Signed)
Hematology/Oncology progress note [facility] Telephone:([phone] [phone]   Patient Care Team: Novella Rob, FNP as PCP - General (Family Medicine)  REFERRING PROVIDER: Novella Rob, FNP  CHIEF COMPLAINTS/REASON FOR VISIT:  Follow up for right breast cancer  HISTORY OF PRESENTING ILLNESS:   Jawanna Dykman is a  60 y.o.  female with PMH listed below was seen in consultation at the request of  Novella Rob, FNP  for evaluation of right breast cancer.   Patient lives in a group home. Accompanied by group home staff Rogers,Dorothea. Mr.Patrick Luz is her legal guardian.  Patient felt right breast mass last year and initially she did not tell anyone until recently, she told her group home staff.  06/20/2021 bilateral diagnostic breast mammogram showed medial right breast highly suspicious 1.3cm with overlying skin dimpling and skin thickening concerning for liver skin involvement.  Normal right axillary ultrasound. No mass in left breast.   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  ER 90%, PR 90%, HER2 negative.  Patient denies any family history of breast cancer. Patient has a history of uterus cancer, status post hysterectomy.  Details unknown. She has paranoid schizophrenia and intellectual disability.  Patient follows up with psychiatry.  07/27/2021 she underwent right mastectomy DIAGNOSIS:  A. RIGHT BREAST WITH SENTINEL NODE #1; MODIFIED RADICAL MASTECTOMY:  - INVASIVE MAMMARY CARCINOMA.  - DUCTAL CARCINOMA IN SITU.  - METASTATIC CARCINOMA INVOLVING TWO OF THREE LYMPH NODES (2/3).  - SEE CANCER SUMMARY BELOW.   B. SENTINEL LYMPH NODES #2 AND #3, RIGHT AXILLA; EXCISIONAL BIOPSY:  - METASTATIC CARCINOMA INVOLVING TWO OF TWO LYMPH NODES (2/2).  - SEE CANCER SUMMARY BELOW.   pT2 pN2a  ER 90%, PR 90% HER2 negative.   patient's  POA Saralyn Pilar declined additional surgery for axillary lymph node dissection and chemotherapy.   INTERVAL HISTORY Ame Heagle is a 60 y.o. female who has above history reviewed by me today presents for follow up visit for breast cancer.   08/31/2021, PET scan showed no evidence of hypermetabolic metastatic disease in the neck, chest, abdomen or pelvis.  Hypermetabolism identified in the anterior right chest wall at the location of the collection of fluid and gas seen on 08/16/2021 CT scan.  Likely related to inflammatory etiology.  09/01/2021, patient has started on tamoxifen 20 mg daily.  Today patient was accompanied by facility staff.  She reports doing well.  Denies any side effects.              Right breast seroma drainage catheter has been discontinued.  Patient was given another course of Bactrim on 09/16/2021.  Review of Systems  Unable to perform ROS: Other (Schizophrenia and intellectual disability.)  Constitutional:  Negative for fatigue.  Respiratory:  Negative for cough and shortness of breath.   Gastrointestinal:  Negative for abdominal distention and abdominal pain.  Neurological:  Negative for headaches and light-headedness.  Hematological:  Negative for adenopathy.  Psychiatric/Behavioral:  Negative for confusion.    MEDICAL HISTORY:  Past Medical History:  Diagnosis Date   Anxiety    Cancer of body of uterus ([place]) 05/31/2012   Endometrial cancer, had hysterectomy w/BSO 07/25/12   GERD (gastroesophageal reflux disease)    History of 2019 novel coronavirus disease (COVID-19) 12/14/2020   HLD (hyperlipidemia) 07/25/2012   Hypertension    Intellectual disability 07/25/2012   Obesity    Pancreatitis due to biliary obstruction  Paranoid schizophrenia ([place])    Right eye injury 11/03/2016    SURGICAL HISTORY: Past Surgical History:  Procedure Laterality Date   ABDOMINAL HYSTERECTOMY  07/25/2012   with BSO   AXILLARY LYMPH NODE BIOPSY Right 07/27/2021   Procedure:  AXILLARY LYMPH NODE BIOPSY;  Surgeon: Ronny Bacon, MD;  Location: [facility] ORS;  Service: General;  Laterality: Right;   BREAST BIOPSY Right 07/07/2021   [place] bx mass, venus marker, path pending   CHOLECYSTECTOMY N/A 01/24/2017   Procedure: LAPAROSCOPIC CHOLECYSTECTOMY WITH INTRAOPERATIVE CHOLANGIOGRAM;  Surgeon: Florene Glen, MD;  Location: [facility] OR[facility];  Service: General;  Laterality: N/A;   EYE SURGERY     INCISION AND DRAINAGE ABSCESS Right 08/17/2021   Procedure: INCISION AND DRAINAGE ABSCESS;  Surgeon: Ronny Bacon, MD;  Location: [facility];  Service: General;  Laterality: Right;   SIMPLE MASTECTOMY WITH AXILLARY SENTINEL NODE BIOPSY Right 07/27/2021   Procedure: SIMPLE MASTECTOMY;  Surgeon: Ronny Bacon, MD;  Location: [facility] OR[facility];  Service: General;  Laterality: Right;   TONSILLECTOMY      SOCIAL HISTORY: Social History   Socioeconomic History   Marital status: Single    Spouse name: Not on file   Number of children: Not on file   Years of education: Not on file   Highest education level: Not on file  Occupational History   Not on file  Tobacco Use   Smoking status: Never   Smokeless tobacco: Never  Vaping Use   Vaping Use: Never used  Substance and Sexual Activity   Alcohol use: No    Alcohol/week: 0.0 standard drinks   Drug use: No   Sexual activity: Not Currently  Other Topics Concern   Not on file  Social History Narrative   Not on file   Social Determinants of Health   Financial Resource Strain: Not on file  Food Insecurity: Not on file  Transportation Needs: Not on file  Physical Activity: Not on file  Stress: Not on file  Social Connections: Not on file  Intimate Partner Violence: Not on file    FAMILY HISTORY: Family History  Problem Relation Age of Onset   Diabetes Neg Hx    Hypertension Neg Hx     ALLERGIES:  has No Known Allergies.  MEDICATIONS:  Current Outpatient Medications  Medication Sig Dispense Refill   amLODipine (NORVASC) 5 MG  tablet Take 5 mg by mouth daily.     Ascorbic Acid (VITAMIN C) 100 MG tablet Take 100 mg by mouth daily.     aspirin 81 MG chewable tablet CHEW ONE TABLET BY MOUTH EVERY DAY     benztropine (COGENTIN) 2 MG tablet Take 1 tablet (2 mg total) by mouth daily. 30 tablet 2   calcium carbonate (OS-CAL) 600 MG tablet Take 2 tablets (1,200 mg total) by mouth daily. 60 tablet 3   loratadine (CLARITIN) 10 MG tablet Take 10 mg by mouth daily.     LORazepam (ATIVAN) 0.5 MG tablet Take 1 tablet (0.5 mg total) by mouth 2 (two) times daily. 60 tablet 2   omeprazole (PRILOSEC) 40 MG capsule Take 40 mg by mouth daily.     Polyvinyl Alcohol-Povidone (REFRESH OP) Apply 1 application to eye 2 (two) times daily. Right eye only     risperiDONE (RISPERDAL) 2 MG tablet Take 1 tablet (2 mg total) by mouth 2 (two) times daily. 60 tablet 2   simvastatin (ZOCOR) 40 MG tablet Take 40 mg by mouth daily.      sulfamethoxazole-trimethoprim (BACTRIM  DS) 800-160 MG tablet Take 1 tablet by mouth 2 (two) times daily for 10 days. 20 tablet 0   tamoxifen (NOLVADEX) 20 MG tablet Take 1 tablet (20 mg total) by mouth daily. 30 tablet 0   zinc gluconate 50 MG tablet Take 50 mg by mouth daily.     abemaciclib (VERZENIO) 150 MG tablet Take 1 tablet (150 mg total) by mouth 2 (two) times daily. Swallow tablets whole. Do not chew, crush, or split tablets before swallowing. (Patient not taking: Reported on 09/22/2021) 56 tablet 1   No current facility-administered medications for this visit.     PHYSICAL EXAMINATION: ECOG PERFORMANCE STATUS: 1 - Symptomatic but completely ambulatory Vitals:   09/22/21 1345  BP: 117/75  Pulse: 89  Resp: 18  Temp: 98.5 F (36.9 C)   Filed Weights   09/22/21 1345  Weight: 177 lb 6.4 oz (80.5 kg)    Physical Exam Constitutional:      General: She is not in acute distress. HENT:     Head: Normocephalic and atraumatic.  Eyes:     General: No scleral icterus. Cardiovascular:     Rate and Rhythm:  Normal rate and regular rhythm.     Heart sounds: Normal heart sounds.  Pulmonary:     Effort: Pulmonary effort is normal. No respiratory distress.     Breath sounds: No wheezing.  Abdominal:     General: Bowel sounds are normal. There is no distension.     Palpations: Abdomen is soft.  Musculoskeletal:        General: No deformity. Normal range of motion.     Cervical back: Normal range of motion and neck supple.  Skin:    General: Skin is warm and dry.     Findings: No erythema or rash.  Neurological:     Mental Status: She is alert and oriented to person, place, and time. Mental status is at baseline.     Cranial Nerves: No cranial nerve deficit.     Coordination: Coordination normal.   LABORATORY DATA:  I have reviewed the data as listed Lab Results  Component Value Date   WBC 7.0 09/01/2021   HGB 10.8 (L) 09/01/2021   HCT 35.2 (L) 09/01/2021   MCV 93.9 09/01/2021   PLT 308 09/01/2021   Recent Labs    07/28/21 1017 07/29/21 0915 08/16/21 1351 08/17/21 0528 08/19/21 0640 08/20/21 0523 09/01/21 1053  NA 134*   < > 133*   < > 139 140 135  K 4.8   < > 3.4*   < > 3.7 3.3* 4.3  CL 94*   < > 96*   < > 104 104 96*  CO2 28   < > 26   < > $R'30 29 31  'XT$ GLUCOSE 159*   < > 138*   < > 123* 126* 102*  BUN 20   < > 44*   < > 29* 25* 12  CREATININE 1.45*   < > 2.53*   < > 1.06* 0.96 0.99  CALCIUM 8.4*   < > 8.1*   < > 7.9* 7.9* 8.8*  GFRNONAA 41*   < > 21*   < > 59* >60 >60  PROT 7.3  --  6.6  --   --   --  6.9  ALBUMIN 3.8  --  2.8*  --   --   --  3.4*  AST 28  --  10*  --   --   --  12*  ALT 37  --  12  --   --   --  13  ALKPHOS 68  --  59  --   --   --  66  BILITOT 0.5  --  0.8  --   --   --  0.4   < > = values in this interval not displayed.    Iron/TIBC/Ferritin/ %Sat No results found for: IRON, TIBC, FERRITIN, IRONPCTSAT    RADIOGRAPHIC STUDIES: I have personally reviewed the radiological images as listed and agreed with the findings in the report. NM PET Image  Initial (PI) Skull Base To Thigh  Result Date: 08/31/2021 CLINICAL DATA:  Initial treatment strategy for right breast cancer. EXAM: NUCLEAR MEDICINE PET SKULL BASE TO THIGH TECHNIQUE: 9.2 mCi F-18 FDG was injected intravenously. Full-ring PET imaging was performed from the skull base to thigh after the radiotracer. CT data was obtained and used for attenuation correction and anatomic localization. Fasting blood glucose: 111 mg/dl COMPARISON:  Chest CT 08/16/2021.  Abdomen/pelvis CT 12/16/2016. FINDINGS: Mediastinal blood pool activity: SUV max 2.1 Liver activity: SUV max NA NECK: No hypermetabolic lymph nodes in the neck. Incidental CT findings: none CHEST: Diffuse FDG accumulation is identified in the anterior right chest wall, associated with the surgical drain in the operative site. No hypermetabolic mediastinal, hilar, or internal mammary lymphadenopathy. No hypermetabolic axillary lymphadenopathy. Low level FDG uptake identified in small right subpectoral nodes, indeterminate given the postoperative findings in the right anterior chest wall. Incidental CT findings: There is mild atherosclerotic calcification of the abdominal aorta without aneurysm. Coronary artery calcification is evident. ABDOMEN/PELVIS: No abnormal hypermetabolic activity within the liver, pancreas, adrenal glands, or spleen. No hypermetabolic lymph nodes in the abdomen or pelvis. Incidental CT findings: Small supraumbilical midline ventral hernia contains only fat. SKELETON: No focal hypermetabolic activity to suggest skeletal metastasis. Incidental CT findings: No worrisome lytic or sclerotic osseous abnormality. Degenerative changes noted both hips and in the shoulders, right greater than left. IMPRESSION: 1. No evidence for hypermetabolic metastatic disease in the neck, chest, abdomen, or pelvis. 2. Hypermetabolism identified in the anterior right chest wall, at the location of the collection of fluid and gas seen on the 08/16/2021  chest CT, likely related to inflammatory etiology. Electronically Signed   By: Misty Stanley M.D.   On: 08/31/2021 13:07      ASSESSMENT & PLAN:  1. Malignant neoplasm of overlapping sites of right breast in female, estrogen receptor positive ([place])   2. Seroma of breast   3. Long-term current use of tamoxifen   Cancer Staging Malignant neoplasm of overlapping sites of right breast in female, estrogen receptor positive ([place]) Staging form: Breast, AJCC 8th Edition - Pathologic stage from 08/03/2021: Stage IB (pT2, pN2a, cM0, G2, ER+, PR+, HER2-) - Signed by Earlie Server, MD on 08/03/2021    Right invasive breast carcinoma, ER+. PR+, HER2-   S/p mastectomy and SLNB pT2 pN2 Patient/POA declined additional axillary lymph node dissection and chemotherapy. They are interested in adjuvant endocrine therapy. 08/30/2021 PET was reviewed, no metastatic disease Patient is perimenopausal. She tolerates tamoxifen well.  Continue tamoxifen 20 mg dail Continue aspirin 81 mg daily.  Seroma infection, patient is currently on a course of Bactrim.  I will hold off starting patient on abemaciclib until her infection is completely resolved.    All questions were answered. The patient knows to call the clinic with any problems questions or concerns.  cc Novella Rob, FNP    Return of visit:  3 weeks.    Earlie Server, MD, PhD 09/22/2021

## 2021-09-22 NOTE — Patient Instructions (Addendum)
Keep a dry gauze on the wound daily.   If you have any concerns or questions, please feel free to call our office. See follow up appointment below.

## 2021-09-22 NOTE — Progress Notes (Signed)
Pt here for follow up. No new concerns voiced.   

## 2021-09-23 ENCOUNTER — Encounter: Payer: Self-pay | Admitting: Oncology

## 2021-09-29 NOTE — Progress Notes (Signed)
Virtual Visit via Telephone Note  I connected with Kylie Saunders on 10/04/21 at  3:30 PM EST by telephone and verified that I am speaking with the correct person using two identifiers.  Location: Patient: group home Provider: office Persons participated in the visit- patient, provider     I discussed the limitations, risks, security and privacy concerns of performing an evaluation and management service by telephone and the availability of in person appointments. I also discussed with the patient that there may be a patient responsible charge related to this service. The patient expressed understanding and agreed to proceed.   I discussed the assessment and treatment plan with the patient. The patient was provided an opportunity to ask questions and all were answered. The patient agreed with the plan and demonstrated an understanding of the instructions.   The patient was advised to call back or seek an in-person evaluation if the symptoms worsen or if the condition fails to improve as anticipated.  I provided 22 minutes of non-face-to-face time during this encounter.   Norman Clay, MD    [facility] MD/PA/NP OP Progress Note  10/04/2021 4:00 PM Kylie Saunders  MRN:  [id number]  Chief Complaint:  Chief Complaint   Follow-up; Other    HPI:  - she underwent mastectomy for malignant neoplasm of overlapping sites of right breast in female, estrogen receptor positive ([place])  - She was admitted for AMS in the context of sepsis, AKI secondary to pneumonia  This is a follow-up appointment for schizophrenia and an anxiety.  She states that she has been doing well.  When she is asked about her breast condition, she states that she feels good about it.  She thinks her provider did a good job of getting rid of the lesion, and she does not have any concern about it.  She reports good relationship with her 2 friends, including her roommate.  She denies any concern at the group home.  She denies feeling  depressed or anxiety.  She sleeps well.  She has good appetite.  She denies AH, VH, paranoia.   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  Kylie Saunders has been admitted twice due to the infection of her breast.  She is on tamoxifen, and did not go through chemotherapy or radiation.  She has been doing well for the past 2 weeks.  Kylie Saunders reported the concern of her running out of lorazepam, although she had been on this medication for many years.  Although she used to be on lorazepam 3 times a day, it was decreased to twice a day by the previous provider.  She would like this medication to be ordered as scheduled.  She denies other concern otherwise.   Daily routine: watch tv, DVD Exercise: walk at times Employment: never employed Support: talks with her mother in [place] every Sunday Household: at [facility] for 2 years (was in another group home, and the owner sold this house) Marital status: Number of children:  Education- 10 th grade, she dropped out as she did not want to go to school as she did not see the need She grew up in [place]. Her mother lives in [place]   Visit Diagnosis:    ICD-10-CM   1. Schizophrenia, paranoid ([place])  F20.0 benztropine (COGENTIN) 2 MG tablet    LORazepam (ATIVAN) 0.5 MG tablet    risperiDONE (RISPERDAL) 2 MG tablet    2. Anxiety state  F41.1 LORazepam (ATIVAN) 0.5 MG tablet  Past Psychiatric History: Please see initial evaluation for full details. I have reviewed the history. No updates at this time.     Past Medical History:  Past Medical History:  Diagnosis Date   Anxiety    Cancer of body of uterus ([place]) 05/31/2012   Endometrial cancer, had hysterectomy w/BSO 07/25/12   GERD (gastroesophageal reflux disease)    History of 2019 novel coronavirus disease (COVID-19) 12/14/2020   HLD (hyperlipidemia) 07/25/2012   Hypertension    Intellectual disability 07/25/2012   Obesity    Pancreatitis due to biliary obstruction    Paranoid  schizophrenia ([place])    Right eye injury 11/03/2016    Past Surgical History:  Procedure Laterality Date   ABDOMINAL HYSTERECTOMY  07/25/2012   with BSO   AXILLARY LYMPH NODE BIOPSY Right 07/27/2021   Procedure: AXILLARY LYMPH NODE BIOPSY;  Surgeon: Ronny Bacon, MD;  Location: [facility];  Service: General;  Laterality: Right;   BREAST BIOPSY Right 07/07/2021   [place] bx mass, venus marker, path pending   CHOLECYSTECTOMY N/A 01/24/2017   Procedure: LAPAROSCOPIC CHOLECYSTECTOMY WITH INTRAOPERATIVE CHOLANGIOGRAM;  Surgeon: Florene Glen, MD;  Location: [facility];  Service: General;  Laterality: N/A;   EYE SURGERY     INCISION AND DRAINAGE ABSCESS Right 08/17/2021   Procedure: INCISION AND DRAINAGE ABSCESS;  Surgeon: Ronny Bacon, MD;  Location: [facility];  Service: General;  Laterality: Right;   SIMPLE MASTECTOMY WITH AXILLARY SENTINEL NODE BIOPSY Right 07/27/2021   Procedure: SIMPLE MASTECTOMY;  Surgeon: Ronny Bacon, MD;  Location: [facility];  Service: General;  Laterality: Right;   TONSILLECTOMY      Family Psychiatric History: Please see initial evaluation for full details. I have reviewed the history. No updates at this time.     Family History:  Family History  Problem Relation Age of Onset   Diabetes Neg Hx    Hypertension Neg Hx     Social History:  Social History   Socioeconomic History   Marital status: Single    Spouse name: Not on file   Number of children: Not on file   Years of education: Not on file   Highest education level: Not on file  Occupational History   Not on file  Tobacco Use   Smoking status: Never   Smokeless tobacco: Never  Vaping Use   Vaping Use: Never used  Substance and Sexual Activity   Alcohol use: No    Alcohol/week: 0.0 standard drinks   Drug use: No   Sexual activity: Not Currently  Other Topics Concern   Not on file  Social History Narrative   Not on file   Social Determinants of Health   Financial Resource Strain: Not on  file  Food Insecurity: Not on file  Transportation Needs: Not on file  Physical Activity: Not on file  Stress: Not on file  Social Connections: Not on file    Allergies: No Known Allergies  Metabolic Disorder Labs: No results found for: HGBA1C, MPG No results found for: PROLACTIN Lab Results  Component Value Date   CHOL 165 06/05/2016   TRIG 192 (H) 06/05/2016   HDL 39 (L) 06/05/2016   [place] 88 06/05/2016   Lab Results  Component Value Date   TSH 2.610 06/05/2016    Therapeutic Level Labs: No results found for: LITHIUM No results found for: VALPROATE No components found for:  CBMZ  Current Medications: Current Outpatient Medications  Medication Sig Dispense Refill   abemaciclib (VERZENIO) 150 MG tablet Take  1 tablet (150 mg total) by mouth 2 (two) times daily. Swallow tablets whole. Do not chew, crush, or split tablets before swallowing. (Patient not taking: Reported on 09/22/2021) 56 tablet 1   amLODipine (NORVASC) 5 MG tablet Take 5 mg by mouth daily.     Ascorbic Acid (VITAMIN C) 100 MG tablet Take 100 mg by mouth daily.     aspirin 81 MG chewable tablet CHEW ONE TABLET BY MOUTH EVERY DAY     benztropine (COGENTIN) 2 MG tablet Take 1 tablet (2 mg total) by mouth daily. 30 tablet 2   calcium carbonate (OS-CAL) 600 MG tablet Take 2 tablets (1,200 mg total) by mouth daily. 60 tablet 3   loratadine (CLARITIN) 10 MG tablet Take 10 mg by mouth daily.     LORazepam (ATIVAN) 0.5 MG tablet Take 1 tablet (0.5 mg total) by mouth 2 (two) times daily. 60 tablet 2   omeprazole (PRILOSEC) 40 MG capsule Take 40 mg by mouth daily.     Polyvinyl Alcohol-Povidone (REFRESH OP) Apply 1 application to eye 2 (two) times daily. Right eye only     risperiDONE (RISPERDAL) 2 MG tablet Take 1 tablet (2 mg total) by mouth 2 (two) times daily. 60 tablet 2   simvastatin (ZOCOR) 40 MG tablet Take 40 mg by mouth daily.      tamoxifen (NOLVADEX) 20 MG tablet Take 1 tablet (20 mg total) by mouth daily.  30 tablet 0   zinc gluconate 50 MG tablet Take 50 mg by mouth daily.     No current facility-administered medications for this visit.     Musculoskeletal: Strength & Muscle Tone:  N/A Gait & Station:  N/A Patient leans: N/A  Psychiatric Specialty Exam: Review of Systems  Psychiatric/Behavioral:  Negative for agitation, behavioral problems, confusion, decreased concentration, dysphoric mood, hallucinations, self-injury, sleep disturbance and suicidal ideas. The patient is not nervous/anxious and is not hyperactive.   All other systems reviewed and are negative.  There were no vitals taken for this visit.There is no height or weight on file to calculate BMI.  General Appearance: NA  Eye Contact:  NA  Speech:  Clear and Coherent  Volume:  Normal  Mood:   good  Affect:  NA  Thought Process:  Coherent  Orientation:  Full (Time, Place, and Person)  Thought Content: Logical   Suicidal Thoughts:  No  Homicidal Thoughts:  No  Memory:  Immediate;   Good  Judgement:  Good  Insight:  Shallow  Psychomotor Activity:  Normal  Concentration:  Concentration: Good and Attention Span: Good  Recall:  Good  Fund of Knowledge: Good  Language: Good  Akathisia:  No  Handed:  Right  AIMS (if indicated): not done  Assets:  Communication Skills Desire for Improvement  ADL's:  Intact  Cognition: WNL  Sleep:  Good   Screenings: PHQ2-9    Flowsheet Row Video Visit from 10/04/2021 in [facility] Video Visit from 06/16/2021 in [facility]  PHQ-2 Total Score 0 0      Flowsheet Row ED to Hosp-Admission (Discharged) from 08/16/2021 in [facility] (1C) ED to Hosp-Admission (Discharged) from 07/28/2021 in [facility] Video Visit from 06/16/2021 in [facility] No Risk No Risk No Risk        Assessment and Plan:  Shayann Garbutt is a 60 y.o.  year old female with a history of Paranoid schizophrenia , intellectual disability  Malignant neoplasm of overlapping sites of right breast in female, estrogen receptor positive ([place]) s/p mastectomy, uterine cancer, hypertension, hyperlipidemia, history of pancreatitis due to biliary obstruction, who presents for follow up appointment for below.   1. Schizophrenia, paranoid ([place]) # Intellectual disability 2. Anxiety state She denies any psychotic or mood symptoms since the last visit, and there is no known behavior issues.  Will continue current dose of Risperdal to target schizophrenia.  Will continue benztropine at this time for EPS; will consider tapering it off in the future to avoid polypharmacy.  Will continue lorazepam as needed for anxiety/irritability.   This clinician has discussed the side effect associated with medication prescribed during this encounter. Please refer to notes in the previous encounters for more details.    Plan Continue Risperidone 2 mg twice a day Continue benztropine 2 mg daily Continue lorazepam 0.5 mg twice a day Next appointment- 2/6 at 21 Am for 30 mins, in person - She sees Dr. Maryjane Hurter, PCP     The patient demonstrates the following risk factors for suicide: Chronic risk factors for suicide include: psychiatric disorder of intellectual disability . Acute risk factors for suicide include: N/A. Protective factors for this patient include: positive social support and hope for the future. Considering these factors, the overall suicide risk at this point appears to be low. Patient is appropriate for outpatient follow up.   The duration of this appointment visit was 22 minutes of face-to-face time with the patient.  Greater than 50% of this time was spent in counseling, explanation of  diagnosis, planning of further management, and coordination of care.       Norman Clay, MD 10/04/2021, 4:00 PM

## 2021-09-30 ENCOUNTER — Telehealth: Payer: Self-pay

## 2021-09-30 DIAGNOSIS — F200 Paranoid schizophrenia: Secondary | ICD-10-CM

## 2021-09-30 DIAGNOSIS — F411 Generalized anxiety disorder: Secondary | ICD-10-CM

## 2021-09-30 MED ORDER — LORAZEPAM 0.5 MG PO TABS
0.5000 mg | ORAL_TABLET | Freq: Two times a day (BID) | ORAL | 0 refills | Status: DC | PRN
Start: 2021-09-30 — End: 2021-10-04

## 2021-09-30 NOTE — Telephone Encounter (Signed)
spoke with home aid she states that pt has been on the lorazepam for years and that she only seen dr. Modesta Messing one time and now patient been out for 3 days and she doesnt want her to have to go to the hopsital. She that dr. Gretel Acre had her on and then when she started seeing dr. Toy Care.  She thinks that dr. Modesta Messing overlooked this medication .  Can you at least give her enough to get to her appt on Tuesday with dr.hisada

## 2021-09-30 NOTE — Telephone Encounter (Signed)
I could not find any Ativan scripts provided for this patient , Please let patient know she could be provided Vistaril low dose as needed for now and she could discuss with Dr.Hisada about further changes .

## 2021-09-30 NOTE — Telephone Encounter (Signed)
Will start lorazepam 0.5 mg twice a day as needed-the [facility] -limited supply.  We will route this message to Dr. Modesta Messing to address once she is back.

## 2021-09-30 NOTE — Telephone Encounter (Signed)
called they state that last rx was from Dr. Modesta Messing last written by Dr. Modesta Messing on 05-27-21 and it was for lorazepam .5mg  take 1 tablet twice a day.

## 2021-09-30 NOTE — Telephone Encounter (Signed)
pt uses [employer], television/film set

## 2021-09-30 NOTE — Telephone Encounter (Signed)
pt needs ativan pt has cancer and has been in and out of the hospital

## 2021-10-03 NOTE — Telephone Encounter (Signed)
Noted, thanks!

## 2021-10-04 ENCOUNTER — Encounter: Payer: Self-pay | Admitting: Psychiatry

## 2021-10-04 ENCOUNTER — Other Ambulatory Visit: Payer: Self-pay

## 2021-10-04 ENCOUNTER — Telehealth (INDEPENDENT_AMBULATORY_CARE_PROVIDER_SITE_OTHER): Payer: Medicare HMO | Admitting: Psychiatry

## 2021-10-04 DIAGNOSIS — F411 Generalized anxiety disorder: Secondary | ICD-10-CM | POA: Diagnosis not present

## 2021-10-04 DIAGNOSIS — F200 Paranoid schizophrenia: Secondary | ICD-10-CM

## 2021-10-04 MED ORDER — BENZTROPINE MESYLATE 2 MG PO TABS: 2.0000 mg | ORAL_TABLET | Freq: Every day | ORAL | 2 refills | Status: AC

## 2021-10-04 MED ORDER — LORAZEPAM 0.5 MG PO TABS
0.5000 mg | ORAL_TABLET | Freq: Two times a day (BID) | ORAL | 2 refills | Status: AC
Start: 2021-10-04 — End: 2022-01-02

## 2021-10-04 MED ORDER — RISPERIDONE 2 MG PO TABS: 2.0000 mg | ORAL_TABLET | Freq: Two times a day (BID) | ORAL | 2 refills | Status: AC

## 2021-10-04 NOTE — Patient Instructions (Signed)
Continue Risperidone 2 mg twice a day Continue benztropine 2 mg daily Continue lorazepam 0.5 mg twice a day Next appointment- 2/6 at 9 Am, in person

## 2021-10-06 ENCOUNTER — Encounter: Payer: Medicare HMO | Admitting: Surgery

## 2021-10-11 NOTE — Telephone Encounter (Signed)
Oral Oncology Patient Advocate Encounter  Submitted renewal application for [employer] by fax on 10/07/21.  Received fax notification that patient is approved until 11/19/22 and will receive Verzenio at no cost.  [place] [facility] [place] Phone [phone] Fax [phone] 10/11/2021 4:11 PM

## 2021-10-18 ENCOUNTER — Ambulatory Visit (INDEPENDENT_AMBULATORY_CARE_PROVIDER_SITE_OTHER): Payer: Medicare HMO | Admitting: Surgery

## 2021-10-18 ENCOUNTER — Encounter: Payer: Self-pay | Admitting: Surgery

## 2021-10-18 ENCOUNTER — Other Ambulatory Visit: Payer: Self-pay

## 2021-10-18 VITALS — BP 121/74 | HR 79 | Temp 99.1°F | Ht 59.0 in | Wt 177.8 lb

## 2021-10-18 DIAGNOSIS — Z9011 Acquired absence of right breast and nipple: Secondary | ICD-10-CM

## 2021-10-18 DIAGNOSIS — Z09 Encounter for follow-up examination after completed treatment for conditions other than malignant neoplasm: Secondary | ICD-10-CM

## 2021-10-18 NOTE — Progress Notes (Signed)
[facility] POST-OP OFFICE VISIT  10/18/2021  HPI: Kylie Saunders is a 60 y.o. female nearly 3 months s/p right mastectomy for cancer.  Her postoperative course was complicated by inadvertent removal of the drain, subsequent large seroma, followed by an infected seroma.  She now presents with no complaints of pain, fevers or chills.  She appears well. Right invasive breast carcinoma, ER+. PR+, HER2-   S/p mastectomy and SLNB  pT2 pN2 Patient/POA declined additional axillary lymph node dissection and chemotherapy. She is tolerating tamoxifen as adjuvant endocrine therapy. 08/30/2021 PET was reviewed, no metastatic disease  Continue tamoxifen 20 mg dail Continue aspirin 81 mg daily.  Vital signs: BP 121/74   Pulse 79   Temp 99.1 F (37.3 C) (Oral)   Ht $R'4\' 11"'NK$  (1.499 m)   Wt 177 lb 12.8 oz (80.6 kg)   SpO2 93%   BMI 35.91 kg/m    Physical Exam: Constitutional: She looks great, she is quite happy and jovial today.  Skin: Right mastectomy skin is clean, dry and intact with no defects, there is no erythema.  There is no appreciable seroma or fluid collection anywhere along the right chest wall.  She appears to be completely healed.  She has adequate range of motion grossly.  Assessment/Plan: This is a 60 y.o. female nearly 3 months s/p right mastectomy.  Patient Active Problem List   Diagnosis Date Noted   Goals of care, counseling/discussion 09/01/2021   Seroma of breast 09/01/2021   MRSA (methicillin resistant Staphylococcus aureus) infection 08/25/2021   Acute respiratory failure with hypoxia and hypercapnia ([place]) 08/18/2021   Hyponatremia 62/56/3893   Acute metabolic encephalopathy 73/42/8768   Normocytic anemia 08/18/2021   Cellulitis, wound, post-operative 08/17/2021   Hypokalemia 08/17/2021   Essential hypertension 08/17/2021   AKI (acute kidney injury) ([place]) 08/16/2021   Malignant neoplasm of right female breast ([place]) 08/14/2021   PNA (pneumonia)  07/28/2021   Sepsis ([place]) 07/28/2021   Aspiration pneumonia ([place]) 07/28/2021   Septic shock ([place]) 07/28/2021   Malignant 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  Usual left breast screening, if desired.  Continue to follow-up with Dr. Tasia Catchings.   Ronny Bacon M.D., FACS 10/18/2021, 2:10 PM

## 2021-10-18 NOTE — Patient Instructions (Signed)
Please call our office if you have any questions or concerns.  

## 2021-10-20 ENCOUNTER — Inpatient Hospital Stay: Payer: Medicare HMO

## 2021-10-20 ENCOUNTER — Inpatient Hospital Stay: Payer: Medicare HMO | Admitting: Pharmacist

## 2021-10-20 ENCOUNTER — Encounter: Payer: Self-pay | Admitting: Oncology

## 2021-10-20 ENCOUNTER — Other Ambulatory Visit: Payer: Self-pay

## 2021-10-20 ENCOUNTER — Inpatient Hospital Stay: Payer: Medicare HMO | Attending: Oncology | Admitting: Oncology

## 2021-10-20 VITALS — BP 118/75 | HR 88 | Temp 97.8°F | Resp 18 | Wt 179.6 lb

## 2021-10-20 DIAGNOSIS — I10 Essential (primary) hypertension: Secondary | ICD-10-CM | POA: Insufficient documentation

## 2021-10-20 DIAGNOSIS — Z5111 Encounter for antineoplastic chemotherapy: Secondary | ICD-10-CM

## 2021-10-20 DIAGNOSIS — Z79810 Long term (current) use of selective estrogen receptor modulators (SERMs): Secondary | ICD-10-CM

## 2021-10-20 DIAGNOSIS — C773 Secondary and unspecified malignant neoplasm of axilla and upper limb lymph nodes: Secondary | ICD-10-CM | POA: Insufficient documentation

## 2021-10-20 DIAGNOSIS — E876 Hypokalemia: Secondary | ICD-10-CM | POA: Diagnosis not present

## 2021-10-20 DIAGNOSIS — C50811 Malignant neoplasm of overlapping sites of right female breast: Secondary | ICD-10-CM | POA: Diagnosis not present

## 2021-10-20 DIAGNOSIS — Z170 Estrogen receptor positive status [ER+]: Secondary | ICD-10-CM

## 2021-10-20 DIAGNOSIS — Z90710 Acquired absence of both cervix and uterus: Secondary | ICD-10-CM | POA: Diagnosis not present

## 2021-10-20 LAB — COMPREHENSIVE METABOLIC PANEL
ALT: 13 U/L (ref 0–44)
AST: 12 U/L — ABNORMAL LOW (ref 15–41)
Albumin: 3.8 g/dL (ref 3.5–5.0)
Alkaline Phosphatase: 51 U/L (ref 38–126)
Anion gap: 8 (ref 5–15)
BUN: 11 mg/dL (ref 8–23)
CO2: 30 mmol/L (ref 22–32)
Calcium: 8.1 mg/dL — ABNORMAL LOW (ref 8.9–10.3)
Chloride: 101 mmol/L (ref 98–111)
Creatinine, Ser: 0.86 mg/dL (ref 0.44–1.00)
GFR, Estimated: >60 mL/min (ref >60)
Glucose, Bld: 120 mg/dL — ABNORMAL HIGH (ref 70–99)
Potassium: 3.3 mmol/L — ABNORMAL LOW (ref 3.5–5.1)
Sodium: 139 mmol/L (ref 135–145)
Total Bilirubin: 0.3 mg/dL (ref 0.3–1.2)
Total Protein: 6.9 g/dL (ref 6.5–8.1)

## 2021-10-20 LAB — CBC WITH DIFFERENTIAL/PLATELET
Abs Immature Granulocytes: 0.01 10*3/uL (ref 0.00–0.07)
Basophils Absolute: 0 10*3/uL (ref 0.0–0.1)
Basophils Relative: 1 %
Eosinophils Absolute: 0.2 10*3/uL (ref 0.0–0.5)
Eosinophils Relative: 4 %
HCT: 35.4 % — ABNORMAL LOW (ref 36.0–46.0)
Hemoglobin: 11 g/dL — ABNORMAL LOW (ref 12.0–15.0)
Immature Granulocytes: 0 %
Lymphocytes Relative: 15 %
Lymphs Abs: 0.9 10*3/uL (ref 0.7–4.0)
MCH: 27.6 pg (ref 26.0–34.0)
MCHC: 31.1 g/dL (ref 30.0–36.0)
MCV: 88.9 fL (ref 80.0–100.0)
Monocytes Absolute: 0.4 10*3/uL (ref 0.1–1.0)
Monocytes Relative: 7 %
Neutro Abs: 4.6 10*3/uL (ref 1.7–7.7)
Neutrophils Relative %: 73 %
Platelets: 200 10*3/uL (ref 150–400)
RBC: 3.98 MIL/uL (ref 3.87–5.11)
RDW: 14.9 % (ref 11.5–15.5)
WBC: 6.2 10*3/uL (ref 4.0–10.5)
nRBC: 0 % (ref 0.0–0.2)

## 2021-10-20 MED ORDER — POTASSIUM CHLORIDE CRYS ER 20 MEQ PO TBCR: 20.0000 meq | EXTENDED_RELEASE_TABLET | Freq: Every day | ORAL | 0 refills | Status: AC

## 2021-10-20 MED ORDER — ONDANSETRON HCL 8 MG PO TABS: 8.0000 mg | ORAL_TABLET | Freq: Two times a day (BID) | ORAL | 2 refills | Status: DC | PRN

## 2021-10-20 MED ORDER — ABEMACICLIB 150 MG PO TABS
150.0000 mg | ORAL_TABLET | Freq: Two times a day (BID) | ORAL | 1 refills | Status: AC
Start: 2021-10-20 — End: ?

## 2021-10-20 MED ORDER — LOPERAMIDE HCL 2 MG PO CAPS: ORAL_CAPSULE | ORAL | 2 refills | Status: AC

## 2021-10-20 NOTE — Progress Notes (Signed)
Hematology/Oncology progress note [phone] [phone]   Patient Care Team: Novella Rob, FNP as PCP - General (Family Medicine)  REFERRING PROVIDER: Novella Rob, FNP  CHIEF COMPLAINTS/REASON FOR VISIT:  Follow up for right breast cancer  HISTORY OF PRESENTING ILLNESS:   Kylie Saunders is a  60 y.o.  female with PMH listed below was seen in consultation at the request of  Novella Rob, FNP  for evaluation of right breast cancer.   Patient lives in a group home. Accompanied by group home staff Rogers,Dorothea. Mr.Patrick Ransom is her legal guardian.  Patient felt right breast mass last year and initially she did not tell anyone until recently, she told her group home staff.  06/20/2021 bilateral diagnostic breast mammogram showed medial right breast highly suspicious 1.3cm with overlying skin dimpling and skin thickening concerning for liver skin involvement.  Normal right axillary ultrasound. No mass in left breast.   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  ER 90%, PR 90%, HER2 negative.  Patient denies any family history of breast cancer. Patient has a history of uterus cancer, status post hysterectomy.  Details unknown. She has paranoid schizophrenia and intellectual disability.  Patient follows up with psychiatry.  07/27/2021 she underwent right mastectomy DIAGNOSIS:  A. RIGHT BREAST WITH SENTINEL NODE #1; MODIFIED RADICAL MASTECTOMY:  - INVASIVE MAMMARY CARCINOMA.  - DUCTAL CARCINOMA IN SITU.  - METASTATIC CARCINOMA INVOLVING TWO OF THREE LYMPH NODES (2/3).  - SEE CANCER SUMMARY BELOW.   B. SENTINEL LYMPH NODES #2 AND #3, RIGHT AXILLA; EXCISIONAL BIOPSY:  - METASTATIC CARCINOMA INVOLVING TWO OF TWO LYMPH NODES (2/2).  - SEE CANCER SUMMARY BELOW.   pT2 pN2a  ER 90%, PR 90% HER2 negative.   patient's POA Saralyn Pilar declined additional  surgery for axillary lymph node dissection and chemotherapy.   08/31/2021, PET scan showed no evidence of hypermetabolic metastatic disease in the neck, chest, abdomen or pelvis.  Hypermetabolism identified in the anterior right chest wall at the location of the collection of fluid and gas seen on 08/16/2021 CT scan.  Likely related to inflammatory etiology.  09/01/2021, patient has started on tamoxifen 20 mg daily.  INTERVAL HISTORY Kylie Saunders is a 60 y.o. female who has above history reviewed by me today presents for follow up visit for breast cancer.  Patient was recently seen by Dr. Christian Mate and seroma infection has resolved. Patient was accompanied by her caregiver Jaclyn Prime. Patient takes tamoxifen daily.  Tolerates well.  No new complaints.  Review of Systems  Unable to perform ROS: Other (Schizophrenia and intellectual disability.)  Constitutional:  Negative for fatigue.  Respiratory:  Negative for cough and shortness of breath.   Gastrointestinal:  Negative for abdominal distention and abdominal pain.  Neurological:  Negative for headaches and light-headedness.  Hematological:  Negative for adenopathy.  Psychiatric/Behavioral:  Negative for confusion.    MEDICAL HISTORY:  Past Medical History:  Diagnosis Date   Anxiety    Cancer of body of uterus ([place]) 05/31/2012   Endometrial cancer, had hysterectomy w/BSO 07/25/12   GERD (gastroesophageal reflux disease)    History of 2019 novel coronavirus disease (COVID-19) 12/14/2020   HLD (hyperlipidemia) 07/25/2012   Hypertension    Intellectual disability 07/25/2012   Obesity    Pancreatitis due to biliary obstruction    Paranoid schizophrenia ([place])    Right eye injury 11/03/2016    SURGICAL HISTORY: Past Surgical History:  Procedure  Laterality Date   ABDOMINAL HYSTERECTOMY  07/25/2012   with BSO   AXILLARY LYMPH NODE BIOPSY Right 07/27/2021   Procedure: AXILLARY LYMPH NODE BIOPSY;  Surgeon: Ronny Bacon, MD;  Location:  [facility] OR[facility];  Service: General;  Laterality: Right;   BREAST BIOPSY Right 07/07/2021   [place] bx mass, venus marker, path pending   CHOLECYSTECTOMY N/A 01/24/2017   Procedure: LAPAROSCOPIC CHOLECYSTECTOMY WITH INTRAOPERATIVE CHOLANGIOGRAM;  Surgeon: Florene Glen, MD;  Location: [facility] OR[facility];  Service: General;  Laterality: N/A;   EYE SURGERY     INCISION AND DRAINAGE ABSCESS Right 08/17/2021   Procedure: INCISION AND DRAINAGE ABSCESS;  Surgeon: Ronny Bacon, MD;  Location: [facility] OR[facility];  Service: General;  Laterality: Right;   SIMPLE MASTECTOMY WITH AXILLARY SENTINEL NODE BIOPSY Right 07/27/2021   Procedure: SIMPLE MASTECTOMY;  Surgeon: Ronny Bacon, MD;  Location: [facility] OR[facility];  Service: General;  Laterality: Right;   TONSILLECTOMY      SOCIAL HISTORY: Social History   Socioeconomic History   Marital status: Single    Spouse name: Not on file   Number of children: Not on file   Years of education: Not on file   Highest education level: Not on file  Occupational History   Not on file  Tobacco Use   Smoking status: Never   Smokeless tobacco: Never  Vaping Use   Vaping Use: Never used  Substance and Sexual Activity   Alcohol use: No    Alcohol/week: 0.0 standard drinks   Drug use: No   Sexual activity: Not Currently  Other Topics Concern   Not on file  Social History Narrative   Not on file   Social Determinants of Health   Financial Resource Strain: Not on file  Food Insecurity: Not on file  Transportation Needs: Not on file  Physical Activity: Not on file  Stress: Not on file  Social Connections: Not on file  Intimate Partner Violence: Not on file    FAMILY HISTORY: Family History  Problem Relation Age of Onset   Diabetes Neg Hx    Hypertension Neg Hx     ALLERGIES:  has No Known Allergies.  MEDICATIONS:  Current Outpatient Medications  Medication Sig Dispense Refill   amLODipine (NORVASC) 5 MG tablet Take 5 mg by mouth daily.     Ascorbic Acid (VITAMIN C) 100 MG  tablet Take 100 mg by mouth daily.     aspirin 81 MG chewable tablet CHEW ONE TABLET BY MOUTH EVERY DAY     benztropine (COGENTIN) 2 MG tablet Take 1 tablet (2 mg total) by mouth daily. 30 tablet 2   calcium carbonate (OS-CAL) 600 MG tablet Take 2 tablets (1,200 mg total) by mouth daily. 60 tablet 3   loratadine (CLARITIN) 10 MG tablet Take 10 mg by mouth daily.     LORazepam (ATIVAN) 0.5 MG tablet Take 1 tablet (0.5 mg total) by mouth 2 (two) times daily. 60 tablet 2   omeprazole (PRILOSEC) 40 MG capsule Take 40 mg by mouth daily.     Polyvinyl Alcohol-Povidone (REFRESH OP) Apply 1 application to eye 2 (two) times daily. Right eye only     risperiDONE (RISPERDAL) 2 MG tablet Take 1 tablet (2 mg total) by mouth 2 (two) times daily. 60 tablet 2   simvastatin (ZOCOR) 40 MG tablet Take 40 mg by mouth daily.      tamoxifen (NOLVADEX) 20 MG tablet Take 1 tablet (20 mg total) by mouth daily. 30 tablet 0   zinc gluconate 50  MG tablet Take 50 mg by mouth daily.     abemaciclib (VERZENIO) 150 MG tablet Take 1 tablet (150 mg total) by mouth 2 (two) times daily. Swallow tablets whole. Do not chew, crush, or split tablets before swallowing. 56 tablet 1   loperamide (IMODIUM) 2 MG capsule For diarrhea: Take 2 capsules (4 mg) by mouth after the first loose bowel movement, and 1 tablet (2 mg) after each additional loose bowel movement. Take no more 4 tablets in 24 hours. 60 capsule 2   ondansetron (ZOFRAN) 8 MG tablet Take 1 tablet (8 mg total) by mouth 2 (two) times daily as needed for nausea or vomiting. 30 tablet 2   No current facility-administered medications for this visit.     PHYSICAL EXAMINATION: ECOG PERFORMANCE STATUS: 1 - Symptomatic but completely ambulatory Vitals:   10/20/21 1351  BP: 118/75  Pulse: 88  Resp: 18  Temp: 97.8 F (36.6 C)   Filed Weights   10/20/21 1351  Weight: 179 lb 9.6 oz (81.5 kg)    Physical Exam Constitutional:      General: She is not in acute  distress. HENT:     Head: Normocephalic and atraumatic.  Eyes:     General: No scleral icterus. Cardiovascular:     Rate and Rhythm: Normal rate and regular rhythm.     Heart sounds: Normal heart sounds.  Pulmonary:     Effort: Pulmonary effort is normal. No respiratory distress.     Breath sounds: No wheezing.  Abdominal:     General: Bowel sounds are normal. There is no distension.     Palpations: Abdomen is soft.  Musculoskeletal:        General: No deformity. Normal range of motion.     Cervical back: Normal range of motion and neck supple.  Skin:    General: Skin is warm and dry.     Findings: No erythema or rash.  Neurological:     Mental Status: She is alert and oriented to person, place, and time. Mental status is at baseline.     Cranial Nerves: No cranial nerve deficit.     Coordination: Coordination normal.  Right breast status postmastectomy.  Postmastectomy changes. 10/20/2021   LABORATORY DATA:  I have reviewed the data as listed Lab Results  Component Value Date   WBC 6.2 10/20/2021   HGB 11.0 (L) 10/20/2021   HCT 35.4 (L) 10/20/2021   MCV 88.9 10/20/2021   PLT 200 10/20/2021   Recent Labs    08/16/21 1351 08/17/21 0528 08/20/21 0523 09/01/21 1053 10/20/21 1454  NA 133*   < > 140 135 139  K 3.4*   < > 3.3* 4.3 3.3*  CL 96*   < > 104 96* 101  CO2 26   < > 29 31 30   GLUCOSE 138*   < > 126* 102* 120*  BUN 44*   < > 25* 12 11  CREATININE 2.53*   < > 0.96 0.99 0.86  CALCIUM 8.1*   < > 7.9* 8.8* 8.1*  GFRNONAA 21*   < > >60 >60 >60  PROT 6.6  --   --  6.9 6.9  ALBUMIN 2.8*  --   --  3.4* 3.8  AST 10*  --   --  12* 12*  ALT 12  --   --  13 13  ALKPHOS 59  --   --  66 51  BILITOT 0.8  --   --  0.4 0.3   < > =  values in this interval not displayed.    Iron/TIBC/Ferritin/ %Sat No results found for: IRON, TIBC, FERRITIN, IRONPCTSAT    RADIOGRAPHIC STUDIES: I have personally reviewed the radiological images as listed and agreed with the findings  in the report. No results found.    ASSESSMENT & PLAN:  1. Malignant neoplasm of overlapping sites of right breast in female, estrogen receptor positive ([place])   2. Long-term current use of tamoxifen   3. Encounter for antineoplastic chemotherapy    Cancer Staging  Malignant neoplasm of overlapping sites of right breast in female, estrogen receptor positive ([place]) Staging form: Breast, AJCC 8th Edition - Pathologic stage from 08/03/2021: Stage IB (pT2, pN2a, cM0, G2, ER+, PR+, HER2-) - Signed by Earlie Server, MD on 08/03/2021    Right invasive breast carcinoma, ER+. PR+, HER2-   S/p mastectomy and SLNB pT2 pN2 Patient/POA declined additional axillary lymph node dissection and chemotherapy. Continue tamoxifen 20 mg daily [patient has a history of hysterectomy] I will obtain CBC and CMP.  Recommend patient to start abemaciclib 150 mg twice daily tomorrow.   This plan was previously discussed with patient and her POA.  Today were not able to reach POA.  RN will call and leave a message. Continue aspirin 81 mg daily. PRN antiemetic, and anti diarrhea medication.  # Hypokalemia, recommend potassium chloride 59meq daily All questions were answered. The patient knows to call the clinic with any problems questions or concerns.  cc Novella Rob, FNP    Return of visit: Follow-up in 2 weeks for evaluation of tolerability.   Earlie Server, MD, PhD 10/20/2021

## 2021-10-20 NOTE — Progress Notes (Signed)
Oral Chemotherapy Clinic [facility]  [phone] [phone]  Patient Care Team: Garnet Koyanagi, FNP as PCP - General (Family Medicine)   Name of the patient: Kylie Saunders  [id number]  04-29-1961   Date of visit: 10/20/21  HPI: Patient is a 60 y.o. female with stage IB breast cancer, ER/PR positive, HER2 negative. Planned adjuvant treatment with Verzenio (abemaciclib). Panned abemaciclib start on 10/21/21.  Reason for Consult: Abemaciclib oral chemotherapy education.   PAST MEDICAL HISTORY: Past Medical History:  Diagnosis Date   Anxiety    Cancer of body of uterus ([facility]) 05/31/2012   Endometrial cancer, had hysterectomy w/BSO 07/25/12   GERD (gastroesophageal reflux disease)    History of 2019 novel coronavirus disease (COVID-19) 12/14/2020   HLD (hyperlipidemia) 07/25/2012   Hypertension    Intellectual disability 07/25/2012   Obesity    Pancreatitis due to biliary obstruction    Paranoid schizophrenia ([facility])    Right eye injury 11/03/2016    HEMATOLOGY/ONCOLOGY HISTORY:  Oncology History  Malignant neoplasm of overlapping sites of right breast in female, estrogen receptor positive ([facility])  07/18/2021 Initial Diagnosis   Malignant neoplasm of overlapping sites of right breast in female, estrogen receptor positive ([facility])   08/03/2021 Cancer Staging   Staging form: Breast, AJCC 8th Edition - Pathologic stage from 08/03/2021: Stage IB (pT2, pN2a, cM0, G2, ER+, PR+, HER2-) - Signed by Rickard Patience, MD on 08/03/2021 Histologic grading system: 3 grade system    08/15/2021 -  Chemotherapy    Patient is on Treatment Plan: BREAST ADJUVANT DOSE DENSE AC Q14D / PACLITAXEL Q7D         ALLERGIES:  has No Known Allergies.  MEDICATIONS:  Current Outpatient Medications  Medication Sig Dispense Refill   abemaciclib (VERZENIO) 150 MG tablet Take 1 tablet (150 mg total) by mouth 2 (two) times daily. Swallow tablets whole. Do not chew, crush, or split  tablets before swallowing. 56 tablet 1   amLODipine (NORVASC) 5 MG tablet Take 5 mg by mouth daily.     Ascorbic Acid (VITAMIN C) 100 MG tablet Take 100 mg by mouth daily.     aspirin 81 MG chewable tablet CHEW ONE TABLET BY MOUTH EVERY DAY     benztropine (COGENTIN) 2 MG tablet Take 1 tablet (2 mg total) by mouth daily. 30 tablet 2   calcium carbonate (OS-CAL) 600 MG tablet Take 2 tablets (1,200 mg total) by mouth daily. 60 tablet 3   loratadine (CLARITIN) 10 MG tablet Take 10 mg by mouth daily.     LORazepam (ATIVAN) 0.5 MG tablet Take 1 tablet (0.5 mg total) by mouth 2 (two) times daily. 60 tablet 2   omeprazole (PRILOSEC) 40 MG capsule Take 40 mg by mouth daily.     Polyvinyl Alcohol-Povidone (REFRESH OP) Apply 1 application to eye 2 (two) times daily. Right eye only     risperiDONE (RISPERDAL) 2 MG tablet Take 1 tablet (2 mg total) by mouth 2 (two) times daily. 60 tablet 2   simvastatin (ZOCOR) 40 MG tablet Take 40 mg by mouth daily.      tamoxifen (NOLVADEX) 20 MG tablet Take 1 tablet (20 mg total) by mouth daily. 30 tablet 0   zinc gluconate 50 MG tablet Take 50 mg by mouth daily.     No current facility-administered medications for this visit.    VITAL SIGNS: There were no vitals taken for this visit. There were no vitals filed for this visit.  Estimated body mass  index is 36.27 kg/m as calculated from the following:   Height as of 10/18/21: _0  (1.499 m).   Weight as of an earlier encounter on 10/20/21: 81.5 kg (179 lb 9.6 oz).  LABS: CBC:    Component Value Date/Time   WBC 7.0 09/01/2021 1053   HGB 10.8 (L) 09/01/2021 1053   HGB 12.7 06/05/2016 0957   HCT 35.2 (L) 09/01/2021 1053   HCT 38.3 06/05/2016 0957   PLT 308 09/01/2021 1053   PLT 249 06/05/2016 0957   MCV 93.9 09/01/2021 1053   MCV 90 06/05/2016 0957   NEUTROABS 4.4 09/01/2021 1053   NEUTROABS 6.4 06/05/2016 0957   LYMPHSABS 1.5 09/01/2021 1053   LYMPHSABS 1.6 06/05/2016 0957   MONOABS 0.7 09/01/2021  1053   EOSABS 0.3 09/01/2021 1053   EOSABS 0.3 06/05/2016 0957   BASOSABS 0.1 09/01/2021 1053   BASOSABS 0.0 06/05/2016 0957   Comprehensive Metabolic Panel:    Component Value Date/Time   NA 135 09/01/2021 1053   NA 141 06/05/2016 0957   K 4.3 09/01/2021 1053   CL 96 (L) 09/01/2021 1053   CO2 31 09/01/2021 1053   BUN 12 09/01/2021 1053   BUN 8 06/05/2016 0957   CREATININE 0.99 09/01/2021 1053   GLUCOSE 102 (H) 09/01/2021 1053   CALCIUM 8.8 (L) 09/01/2021 1053   AST 12 (L) 09/01/2021 1053   ALT 13 09/01/2021 1053   ALKPHOS 66 09/01/2021 1053   BILITOT 0.4 09/01/2021 1053   BILITOT <0.2 06/05/2016 0957   PROT 6.9 09/01/2021 1053   PROT 6.9 06/05/2016 0957   ALBUMIN 3.4 (L) 09/01/2021 1053   ALBUMIN 4.0 06/05/2016 0957     Present during today's visit: patient and Kylie Saunders (group home caregiver)  Assessment and Plan: Start plan: Patient will start taking abemciclib tomorrow 10/21/21  Order provided to Kylie Saunders today so she has the ability to administer abemaciclib to Kylie Saunders. Prescriptions for prn medications loperamide and ondansetron were sent to the dispensing pharmacy used by the group home.    Patient Education I spoke with patient and Kylie Saunders to re-review the new oral chemotherapy medication: abemaciclib, adjuvant treatment  Administration: Counseled patient on administration, dosing, side effects, monitoring, drug-food interactions, safe handling, storage, and disposal. Patient will take 1 tablet (124m) by mouth twice daily.  Side Effects: Side effects include but not limited to: diarrhea, N/V, fatigue, decrease in wbc/hgb/plt.   Diarrhea: Informed to call the office if patient has four or more loose stools a day  Drug-drug Interactions (DDI): No DDIs currently  Adherence: After discussion with patient no patient barriers to medication adherence identified.  Reviewed with patient importance of keeping a medication schedule and plan for any missed  doses.  Ms. MPetrucciand DJaclyn Primevoiced understanding and appreciation. All questions answered.  Provided patient and DJaclyn Primewith [facility]unders Clinicphone number. Patient knows to call the office with questions or concerns. Oral Chemotherapy Navigation Clinic will continue to follow.  Patient expressed understanding and was in agreement with this plan. She also understands that She can call clinic at any time with any questions, concerns, or complaints.   Medication Access Issues: No issues , pt on manufacturer assistance, reviewed the refill process with DJaclyn Saunders Follow-up plan: RTC in 2 weeks   Thank you for allowing me to participate in the care of this patient.   Time Total: 15 mins  Visit consisted of counseling and education on dealing with issues of symptom management in the setting of  serious and potentially life-threatening illness.Greater than 50%  of this time was spent counseling and coordinating care related to the above assessment and plan.  Signed by: Darl Pikes, PharmD, BCPS, Salley Slaughter, CPP Hematology/Oncology Clinical Pharmacist Practitioner [place]/HP/AP [facility] [phone]  10/20/2021 2:47 PM

## 2021-10-20 NOTE — Progress Notes (Signed)
Pt here for follow up. No new concerns voiced.   

## 2021-10-21 ENCOUNTER — Telehealth: Payer: Self-pay

## 2021-10-21 ENCOUNTER — Telehealth: Payer: Self-pay | Admitting: Pharmacist

## 2021-10-21 DIAGNOSIS — C50811 Malignant neoplasm of overlapping sites of right female breast: Secondary | ICD-10-CM

## 2021-10-21 MED ORDER — ONDANSETRON HCL 8 MG PO TABS: 8.0000 mg | ORAL_TABLET | Freq: Two times a day (BID) | ORAL | 2 refills | Status: AC

## 2021-10-21 MED ORDER — PROCHLORPERAZINE MALEATE 10 MG PO TABS: 10.0000 mg | ORAL_TABLET | Freq: Four times a day (QID) | ORAL | 0 refills | Status: AC | PRN

## 2021-10-21 NOTE — Telephone Encounter (Signed)
-----   Message from Earlie Server, MD sent at 10/20/2021  9:30 PM EST ----- Please let Kylie Saunders know that her potassium is low. Recommend potassium supplementation. Rx sent.  Also calcium is low too, please check if she is taking calcium 1200mg  daily. Thanks.

## 2021-10-21 NOTE — Telephone Encounter (Signed)
Oral Chemotherapy Pharmacist Encounter   Received a call from [place] stated that Ms. Guimond took one dose of her amebaciclib this morning and started to have nausea. Per Jaclyn Prime, patient is stating that she does not want to take any more abemecicilib at this time.   A prescription of prn ondansteron was sent in yesterday following her appt to the pharmacy the group home uses to fill there medication but it has not yet been delivered to the group.  Because of the rapid onset of nausea. Antiemetic prescriptions were updated to include prochlorperazine prn and ondansetron scheduled 30-60 mins prior to abemaciclib.   Jaclyn Prime was instructed to use the prn prochlorperazine (once in hand) to get Ms. Neale's nausea controlled then restart the abemaciclib using the ondansetron as a pre-medication.   Jaclyn Prime knows to call with any additional issues.   Darl Pikes, PharmD, BCPS, BCOP, CPP Hematology/Oncology Clinical Pharmacist [place]/DB/AP [facility] [phone]  10/21/2021 2:08 PM

## 2021-10-21 NOTE — Telephone Encounter (Signed)
Spoke to [employer] and informed of MD recommendations, she verbalized understanding and confirmed that pt is on calcium 1200 mg daily.   Spoke to Saralyn Pilar (legal guardian/ brother ) and updated him on patient's plan to resume on verzencio.

## 2021-10-26 ENCOUNTER — Telehealth: Payer: Self-pay | Admitting: Pharmacist

## 2021-10-26 NOTE — Telephone Encounter (Signed)
Group home caregiver Jaclyn Prime called this morning to report patient had passed away this morning. Dr. Tasia Catchings notified.

## 2021-10-26 NOTE — Telephone Encounter (Signed)
Alyson received called from caregiver stating that patient passed away. Please cancel all appts

## 2021-11-01 ENCOUNTER — Telehealth: Payer: Self-pay | Admitting: Oncology

## 2021-11-01 ENCOUNTER — Telehealth: Payer: Self-pay | Admitting: *Deleted

## 2021-11-01 NOTE — Telephone Encounter (Signed)
Patient brother called asking to speak with Dr Tasia Catchings regarding refusal to sign the death certificate. Patient PCP has refused to sign it also since she has an Materials engineer. Patient has been in a freezer for a week waiting for someone to sign the death certificate. Please return his call to discuss. [phone]

## 2021-11-01 NOTE — Telephone Encounter (Signed)
Dr. Tasia Catchings has discussed this with PCP Olen Pel.

## 2021-11-01 NOTE — Telephone Encounter (Signed)
I called patient's power of attorney Kylie Saunders. Patient has stage IB breast cancer status postmastectomy and a sentinel lymph node biopsy. She was taking tamoxifen 20 mg daily with aspirin and patient took 1 dose of abemaciclib on 10/21/2021 and was not able to tolerate due to nausea.  She refused any additional doses.  Pharmacist received a phone call from caregiver on 11-15-21 that patient passed away that morning. Patient has multiple medical conditions including hypertension, hyperlipidemia, obesity, schizophrenia on multiple psychiatric medications.  After her breast surgery, she had 2 admission in September 2022 due to being unresponsive and altered mental status, acute hypoxic respiratory failure due to pneumonia.  Patient was readmitted due to surgical site infection. I discussed with Kylie Saunders that there were many possibilities which could lead to her death. Given the temporary use of abemaciclib, and the timeframe of her sudden death being 5-6 days later, I think this is less likely secondary to abemaciclib use.  Her death was unexpected from oncology aspect. I have also discussed with her primary care provider Olen Pel earlier.  I recommend primary care provider to send case to medical examiner.  Same recommendation was discussed with Kylie Saunders and he voices understanding and he will contact funeral center.

## 2021-11-01 NOTE — Telephone Encounter (Signed)
Patients brother called in regards to the patient and stated that she passed away a week ago and he is needing some one to sign the death certificate. He stated that his PCP and oncology is not wanting to sign it.

## 2021-11-03 ENCOUNTER — Other Ambulatory Visit: Payer: Medicare HMO

## 2021-11-03 ENCOUNTER — Ambulatory Visit: Payer: Medicare HMO | Admitting: Oncology

## 2021-11-03 ENCOUNTER — Ambulatory Visit: Payer: Medicare HMO | Admitting: Pharmacist

## 2021-11-20 NOTE — Telephone Encounter (Signed)
Oral Chemotherapy Pharmacist Encounter   Patient's caregiver Kylie Saunders called yesterday 10/24/21 to report that Kylie Saunders is refusing to take any more abemaciclib. Kylie Saunders reported that both she and the patient's brother (legal guardian) have tried to talk to Kylie Saunders about giving the abemciclib another try but they have been unsuccessful.    Darl Pikes, PharmD, BCPS, BCOP, CPP Hematology/Oncology Clinical Pharmacist [place]/DB/AP Oral [facility] [phone]  2021/11/12 12:32 PM

## 2021-11-20 DEATH — deceased

## 2021-12-26 ENCOUNTER — Ambulatory Visit: Payer: Medicare HMO | Admitting: Psychiatry

## 2022-02-24 ENCOUNTER — Other Ambulatory Visit: Payer: Self-pay | Admitting: Oncology

## 2022-02-24 DIAGNOSIS — Z170 Estrogen receptor positive status [ER+]: Secondary | ICD-10-CM

## 2022-02-24 DIAGNOSIS — C50811 Malignant neoplasm of overlapping sites of right female breast: Secondary | ICD-10-CM
